# Patient Record
Sex: Female | Born: 1970
Health system: Southern US, Community
[De-identification: ages and names within clinical notes are randomized; demographics above are authoritative.]

## PROBLEM LIST (undated history)

## (undated) DIAGNOSIS — I10 Essential (primary) hypertension: Secondary | ICD-10-CM

## (undated) DIAGNOSIS — R112 Nausea with vomiting, unspecified: Secondary | ICD-10-CM

## (undated) DIAGNOSIS — N83202 Unspecified ovarian cyst, left side: Secondary | ICD-10-CM

## (undated) DIAGNOSIS — F419 Anxiety disorder, unspecified: Secondary | ICD-10-CM

## (undated) DIAGNOSIS — Z8759 Personal history of other complications of pregnancy, childbirth and the puerperium: Secondary | ICD-10-CM

## (undated) DIAGNOSIS — B029 Zoster without complications: Secondary | ICD-10-CM

## (undated) DIAGNOSIS — N920 Excessive and frequent menstruation with regular cycle: Secondary | ICD-10-CM

## (undated) DIAGNOSIS — F32A Depression, unspecified: Secondary | ICD-10-CM

## (undated) DIAGNOSIS — Z9889 Other specified postprocedural states: Secondary | ICD-10-CM

## (undated) DIAGNOSIS — Z8742 Personal history of other diseases of the female genital tract: Secondary | ICD-10-CM

## (undated) DIAGNOSIS — A498 Other bacterial infections of unspecified site: Secondary | ICD-10-CM

## (undated) DIAGNOSIS — T7840XA Allergy, unspecified, initial encounter: Secondary | ICD-10-CM

## (undated) DIAGNOSIS — D259 Leiomyoma of uterus, unspecified: Secondary | ICD-10-CM

## (undated) DIAGNOSIS — F329 Major depressive disorder, single episode, unspecified: Secondary | ICD-10-CM

## (undated) HISTORY — DX: Anxiety disorder, unspecified: F41.9

## (undated) HISTORY — DX: Zoster without complications: B02.9

## (undated) HISTORY — PX: APPENDECTOMY: SHX54

## (undated) HISTORY — DX: Major depressive disorder, single episode, unspecified: F32.9

## (undated) HISTORY — DX: Other bacterial infections of unspecified site: A49.8

## (undated) HISTORY — DX: Depression, unspecified: F32.A

## (undated) HISTORY — PX: TONSILLECTOMY AND ADENOIDECTOMY: SUR1326

## (undated) HISTORY — PX: DILATION AND EVACUATION: SHX1459

## (undated) HISTORY — PX: OTHER SURGICAL HISTORY: SHX169

## (undated) HISTORY — DX: Essential (primary) hypertension: I10

## (undated) HISTORY — DX: Allergy, unspecified, initial encounter: T78.40XA

---

## 1994-04-28 HISTORY — PX: WISDOM TOOTH EXTRACTION: SHX21

## 1999-07-23 ENCOUNTER — Other Ambulatory Visit: Admission: RE | Admit: 1999-07-23 | Discharge: 1999-07-23 | Payer: Self-pay | Admitting: Obstetrics and Gynecology

## 2000-08-12 ENCOUNTER — Other Ambulatory Visit: Admission: RE | Admit: 2000-08-12 | Discharge: 2000-08-12 | Payer: Self-pay | Admitting: Obstetrics and Gynecology

## 2001-05-03 ENCOUNTER — Encounter: Admission: RE | Admit: 2001-05-03 | Discharge: 2001-05-03 | Payer: Self-pay | Admitting: Family Medicine

## 2001-05-03 ENCOUNTER — Encounter: Payer: Self-pay | Admitting: Family Medicine

## 2001-08-14 ENCOUNTER — Inpatient Hospital Stay (HOSPITAL_COMMUNITY): Admission: AD | Admit: 2001-08-14 | Discharge: 2001-08-14 | Payer: Self-pay | Admitting: Obstetrics and Gynecology

## 2001-08-14 ENCOUNTER — Encounter: Payer: Self-pay | Admitting: Obstetrics and Gynecology

## 2001-08-18 ENCOUNTER — Encounter (INDEPENDENT_AMBULATORY_CARE_PROVIDER_SITE_OTHER): Payer: Self-pay

## 2001-08-18 ENCOUNTER — Observation Stay (HOSPITAL_COMMUNITY): Admission: AD | Admit: 2001-08-18 | Discharge: 2001-08-19 | Payer: Self-pay | Admitting: Obstetrics and Gynecology

## 2001-08-18 ENCOUNTER — Encounter: Payer: Self-pay | Admitting: Obstetrics and Gynecology

## 2001-10-04 ENCOUNTER — Other Ambulatory Visit: Admission: RE | Admit: 2001-10-04 | Discharge: 2001-10-04 | Payer: Self-pay | Admitting: Obstetrics & Gynecology

## 2001-10-25 ENCOUNTER — Encounter: Payer: Self-pay | Admitting: Obstetrics & Gynecology

## 2001-10-25 ENCOUNTER — Ambulatory Visit (HOSPITAL_COMMUNITY): Admission: RE | Admit: 2001-10-25 | Discharge: 2001-10-25 | Payer: Self-pay | Admitting: Obstetrics & Gynecology

## 2002-07-28 ENCOUNTER — Other Ambulatory Visit: Admission: RE | Admit: 2002-07-28 | Discharge: 2002-07-28 | Payer: Self-pay | Admitting: Obstetrics & Gynecology

## 2003-01-27 ENCOUNTER — Other Ambulatory Visit: Admission: RE | Admit: 2003-01-27 | Discharge: 2003-01-27 | Payer: Self-pay | Admitting: Obstetrics & Gynecology

## 2003-02-15 ENCOUNTER — Inpatient Hospital Stay (HOSPITAL_COMMUNITY): Admission: AD | Admit: 2003-02-15 | Discharge: 2003-02-17 | Payer: Self-pay | Admitting: Obstetrics & Gynecology

## 2003-02-15 ENCOUNTER — Encounter (INDEPENDENT_AMBULATORY_CARE_PROVIDER_SITE_OTHER): Payer: Self-pay | Admitting: Specialist

## 2003-02-18 ENCOUNTER — Encounter: Admission: RE | Admit: 2003-02-18 | Discharge: 2003-03-20 | Payer: Self-pay | Admitting: Obstetrics & Gynecology

## 2003-03-05 ENCOUNTER — Inpatient Hospital Stay (HOSPITAL_COMMUNITY): Admission: AD | Admit: 2003-03-05 | Discharge: 2003-03-05 | Payer: Self-pay | Admitting: Obstetrics & Gynecology

## 2003-03-10 ENCOUNTER — Encounter (INDEPENDENT_AMBULATORY_CARE_PROVIDER_SITE_OTHER): Payer: Self-pay | Admitting: *Deleted

## 2003-03-10 ENCOUNTER — Ambulatory Visit (HOSPITAL_COMMUNITY): Admission: RE | Admit: 2003-03-10 | Discharge: 2003-03-10 | Payer: Self-pay | Admitting: Obstetrics & Gynecology

## 2003-08-30 ENCOUNTER — Other Ambulatory Visit: Admission: RE | Admit: 2003-08-30 | Discharge: 2003-08-30 | Payer: Self-pay | Admitting: Obstetrics & Gynecology

## 2004-05-18 IMAGING — US US PELVIS COMPLETE MODIFY
1 series · 18 of 25 positions shown · non-contrast
Comparison: none

CLINICAL DATA: Vaginal delivery 02/15/03, heavy postpartum bleeding.
PELVIC AND TRANSVAGINAL ULTRASOUND - NON-OB
Transabdominal and endovaginal ultrasound of the pelvis performed.

[Series 1: us pelvis complete · 18 of 44 slices shown]
[im 1/44]
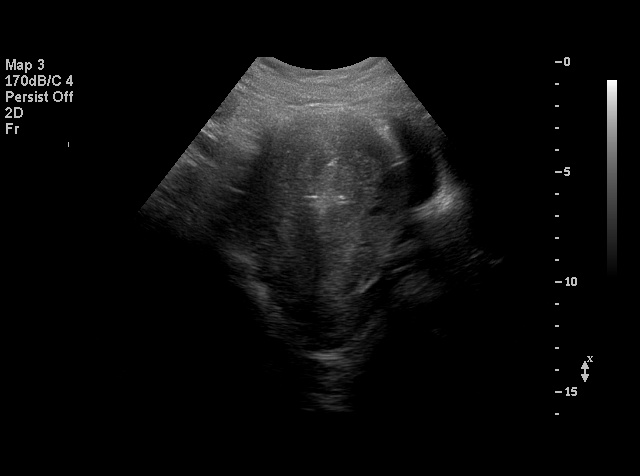
[im 4/44]
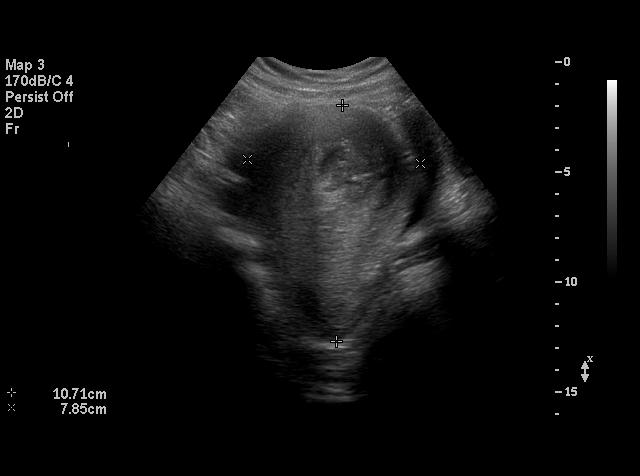
[im 6/44]
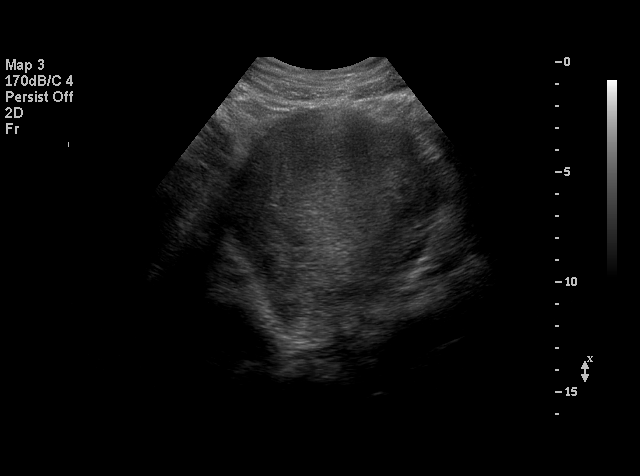
[im 8/44]
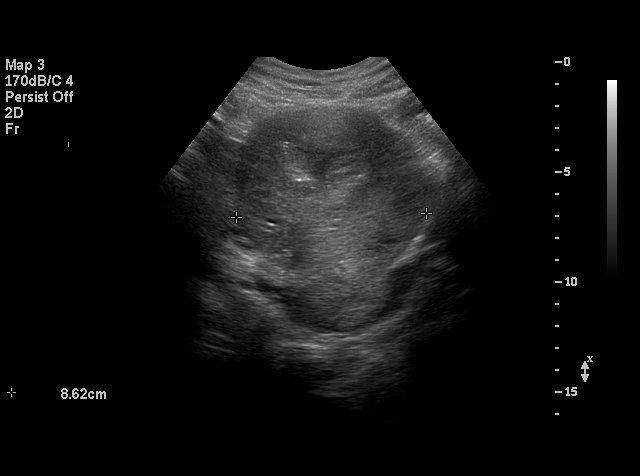
[im 11/44]
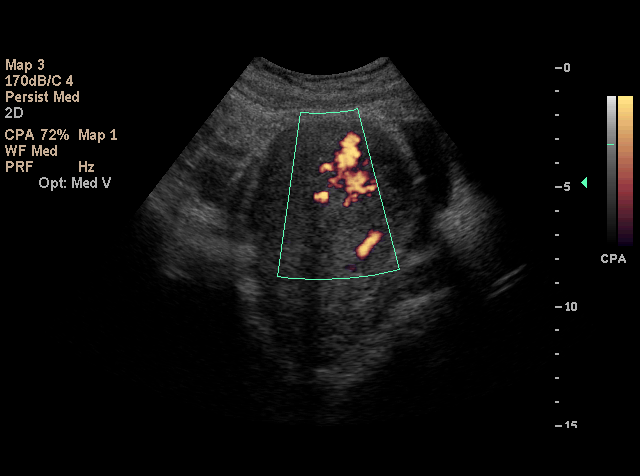
[im 13/44]
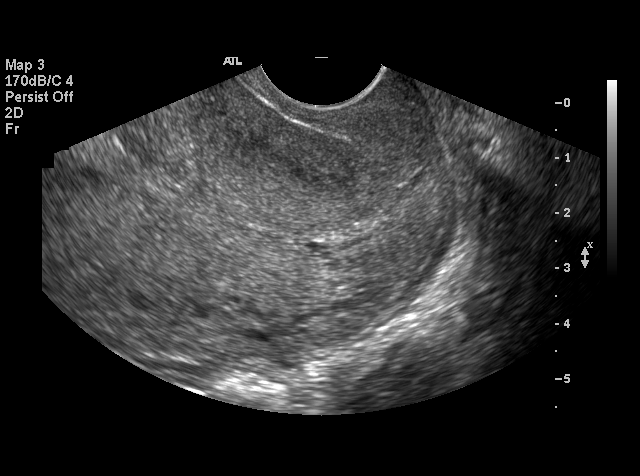
[im 17/44]
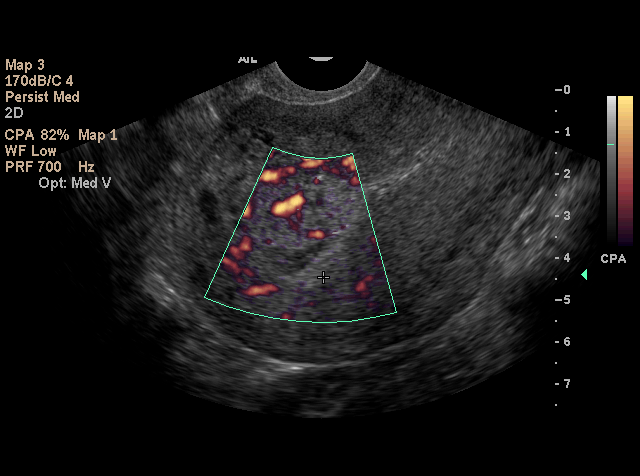
[im 18/44]
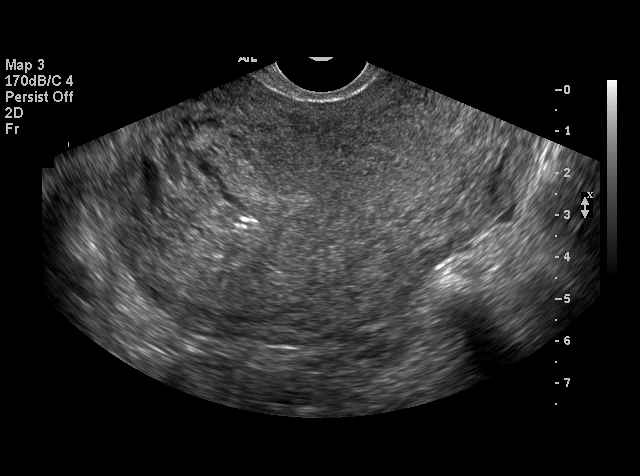
[im 20/44]
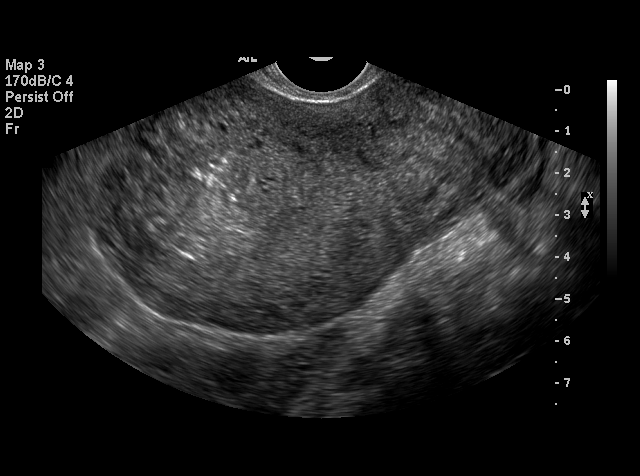
[im 24/44]
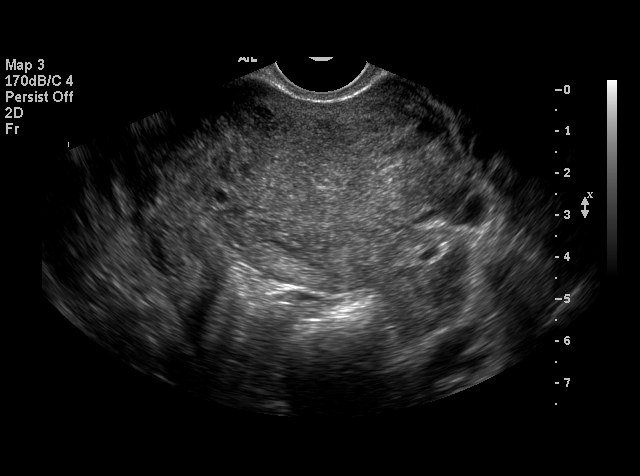
[im 26/44]
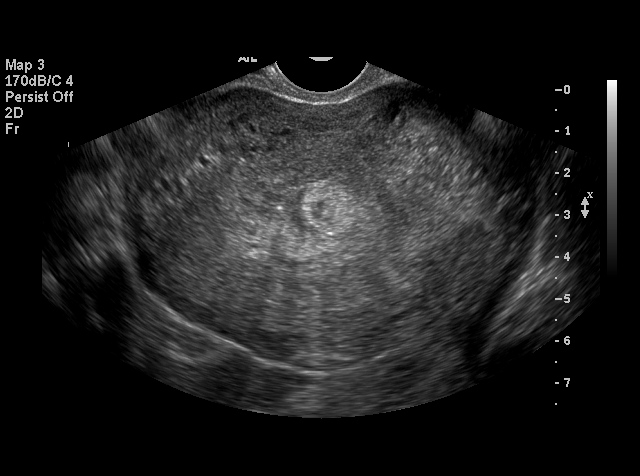
[im 27/44]
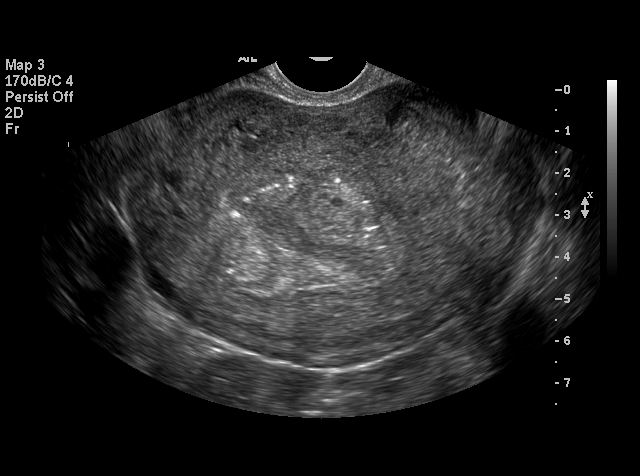
[im 31/44]
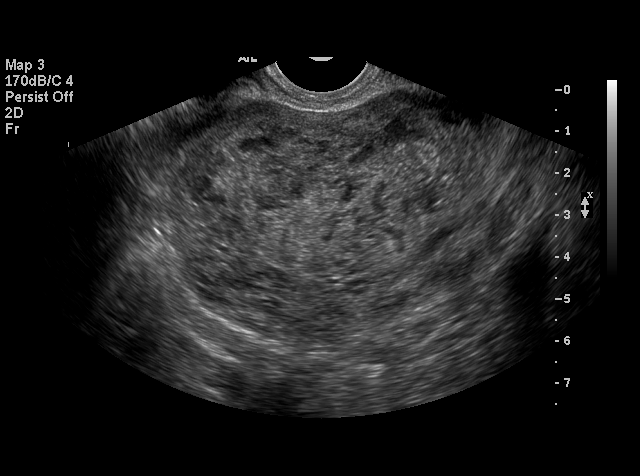
[im 33/44]
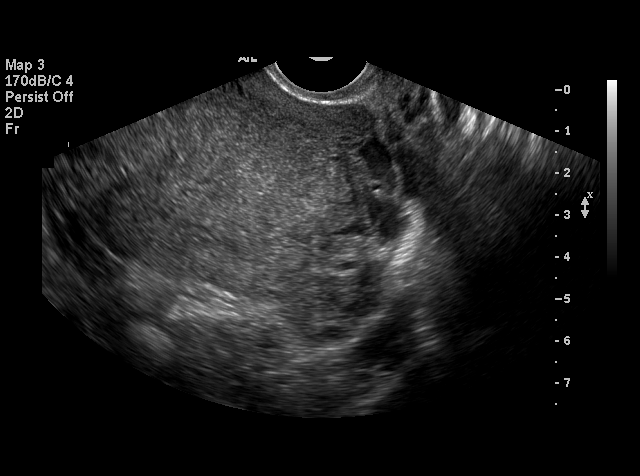
[im 36/44]
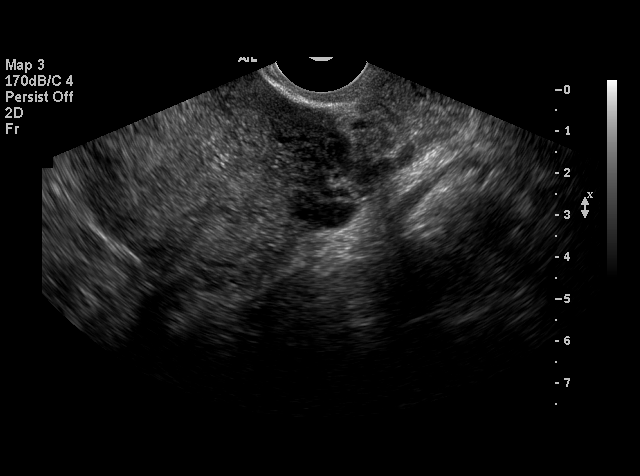
[im 38/44]
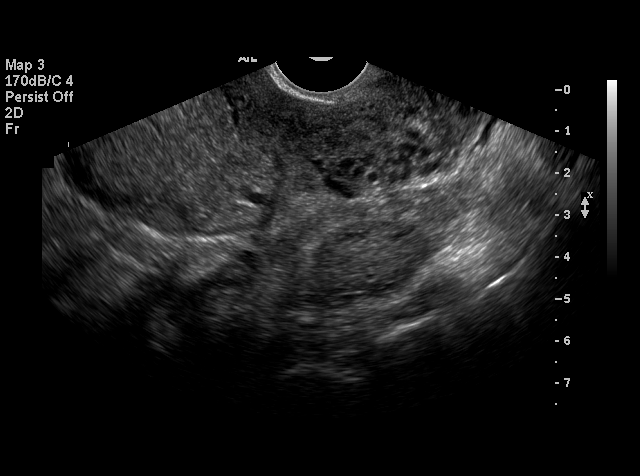
[im 40/44]
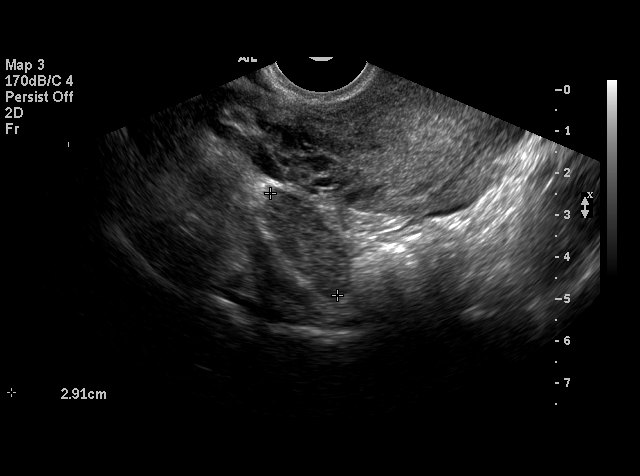
[im 44/44]
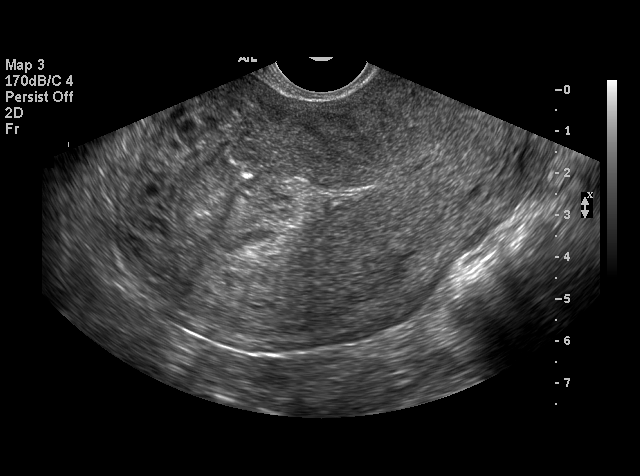

[18 of 25 positions shown; findings below may reference images not displayed]

The uterus measures 10.7 cm length x 7.6 cm AP x 8.6 cm transverse.  At the fundus of the uterus, the endometrial stripe is thickened, inhomogeneous, with somewhat echogenic areas.  Increased blood flow is seen within this site on color doppler imaging.  Findings are highly suspicious for retained products of conception.  No free pelvic fluid.  Ovaries unremarkable, right measuring 3.5 x 1.8 x 2.9 cm and left measuring 3.4 x 1.8 x 3.0 cm. 
IMPRESSION
Normal ovaries.  Hypervascular inhomogeneous thickening of the fundal portion of the endometrial stripe highly suggestive of retain products of conception.

## 2007-11-30 ENCOUNTER — Encounter: Admission: RE | Admit: 2007-11-30 | Discharge: 2007-11-30 | Payer: Self-pay | Admitting: Family Medicine

## 2007-11-30 ENCOUNTER — Encounter (INDEPENDENT_AMBULATORY_CARE_PROVIDER_SITE_OTHER): Payer: Self-pay | Admitting: General Surgery

## 2007-11-30 ENCOUNTER — Inpatient Hospital Stay (HOSPITAL_COMMUNITY): Admission: EM | Admit: 2007-11-30 | Discharge: 2007-12-01 | Payer: Self-pay | Admitting: Emergency Medicine

## 2007-11-30 HISTORY — PX: LAPAROSCOPIC APPENDECTOMY: SUR753

## 2009-04-28 HISTORY — PX: SEPTOPLASTY: SUR1290

## 2010-05-18 ENCOUNTER — Encounter: Payer: Self-pay | Admitting: Family Medicine

## 2010-08-07 ENCOUNTER — Other Ambulatory Visit: Payer: Self-pay | Admitting: Obstetrics & Gynecology

## 2010-09-10 NOTE — H&P (Signed)
NAMEAMBERMARIE, HONEYMAN                 ACCOUNT NO.:  [id number]   MEDICAL RECORD NO.:  [id number]          PATIENT TYPE:  INP   LOCATION:  5125                         FACILITY:  [facility]   PHYSICIAN:  Wilmon Arms. Corliss Skains, M.D. DATE OF BIRTH:  25-Aug-1970   DATE OF ADMISSION:  11/30/2007  DATE OF DISCHARGE:                              HISTORY & PHYSICAL   ADMITTING PHYSICIAN:  Wilmon Arms. Corliss Skains, MD.   PRIMARY CARE PHYSICIAN:  [employer]   CHIEF COMPLAINT:  Right lower quadrant abdominal pain.   HISTORY OF PRESENT ILLNESS:  Ms. Shareef is a 40 year old, otherwise,  healthy female patient who reports 3 days of right lower quadrant  abdominal pain waxing and waning becoming more constant associated with  nausea and emesis.  She presents to her primary care physician's office  today where she was found to have leukocytosis.  This is per the patient  report.  No labs accompany the patient here to ER.  She subsequently  underwent a CT of the abdomen and pelvis, which revealed appendicitis.  Urine pregnancy was self-reported as being negative prior to the CT scan  per the patient.  Labs including CBC and repeat urine pregnancy are  pending here.  In regards to the patient's symptoms, her pain has  decreased from constant to mainly with movement and sharp in nature.  She has not had any further nausea since going to the doctor yesterday  and receiving a Phenergan dose.  She has not eaten since yesterday.   PAST MEDICAL HISTORY:  Seasonal allergies.   PAST SURGICAL HISTORY:  Ectopic pregnancy requiring surgical removal via  small left lower quadrant incision several years ago.   FAMILY HISTORY:  Noncontributory.   SOCIAL HISTORY:  No tobacco.  Rare social alcohol.  She is married.  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   ALLERGIES:  NKDA.   CURRENT MEDICATIONS:  1. Claritin p.r.n.  2. Microgestin 1.5/30 daily.   REVIEW OF SYSTEMS:  As per the history of  present illness.  Otherwise,  all categories of review of systems are noncontributory and negative.   PHYSICAL EXAMINATION:  GENERAL:  Pleasant female patient complaining of  improvement in right lower quadrant pain, but still present and severe  when occurs.  VITAL SIGNS:  Temperature 97.3, BP 134/84, pulse 86 and regular,  respirations 18.  HEENT:  Head is normocephalic.  NECK:  Supple.  No appreciable adenopathy.  LUNGS:  Bilateral lung sounds are clear to auscultation.  Respiratory  effort is unlabored.  CARDIOVASCULAR:  Heart sounds, S1 and S2.  No rubs, murmurs, thrills, or  gallops.  No JVD.  No peripheral edema.  GASTROINTESTINAL:  Abdomen is soft and nondistended.  Bowel sounds are  present and hyperactive.  She is tender in the right lower quadrant over  McBurney point.  No guarding.  No rebounding.  EXTREMITIES:  Symmetrical in appearance without edema or clubbing.  Moving all extremities x4.  Strength is 5/5 symmetrically and sensation  is intact in the upper and lower extremities bilaterally.  NEUROLOGIC:  Cranial nerves II-XII are grossly intact.  PSYCH:  The patient is alert and oriented x3.  Moving all extremities  x4.  Affect is appropriate to current situation.   LABS AND X-RAY:  CMP, CBC, and urine pregnancy are all pending.  CT of  the abdomen, a typewritten report from [facility] accompanies  the patient and again shows an enlarged appendix consistent with acute  appendicitis.  No abscess.  No evidence of perforation.   IMPRESSION:  Acute appendicitis.   PLAN:  1. Admit the patient.  Plan operative intervention tonight.  2. Empiric Unasyn IV, on-call to OR once pregnancy test has returned      back negative.  3. Symptomatic treatment with pain medication and antiemetics.  The      patient reports that she has severe nausea with anesthetic agent.      She has already received a dose of Zofran.  We will go ahead and      give her Protonix IV,  scopolamine now to help ward off these      symptoms postoperatively.      Allison L. Rondel Jumbo. Tsuei, M.D.  Electronically Signed    ALE/MEDQ  D:  11/30/2007  T:  12/01/2007  Job:  [id number]   cc:   [facility]

## 2010-09-10 NOTE — Op Note (Signed)
[name], [name]                 ACCOUNT NO.:  [id number]   MEDICAL RECORD NO.:  [id number]          PATIENT TYPE:  INP   LOCATION:  5125                         FACILITY:  [facility]   PHYSICIAN:  Sharlet Salina T. Hoxworth, M.D.DATE OF BIRTH:  December 22, 1970   DATE OF PROCEDURE:  DATE OF DISCHARGE:                               OPERATIVE REPORT   PREOPERATIVE DIAGNOSIS:  Acute appendicitis.   POSTOPERATIVE DIAGNOSIS:  Acute appendicitis.   SURGICAL PROCEDURES:  Laparoscopic appendectomy.   SURGEON:  Lorne Skeens. Hoxworth, MD   ANESTHESIA:  General.   BRIEF HISTORY:  [name] is a generally healthy 40 year old female  who presents with typical symptoms and physical findings for acute  appendicitis.  This has been confirmed by CT scan.  We have recommended  proceeding with laparoscopic appendectomy.  I discussed the nature of  procedure, indications, risks of bleeding, infection, and possible need  for open procedure.  She is now brought to operating room for this  procedure.   DESCRIPTION OF OPERATION:  The patient was brought to the operating  room, placed in supine position on the operating table, and general  orotracheal anesthesia was induced.  Foley catheter was placed.  PAS  were placed.  She was on broad-spectrum antibiotics.  The abdomen was  widely and sterilely prepped and draped.  Correct patient and procedure  were verified.  Local anesthesia was used to infiltrate the trocar  sites.  Access was obtained through a 1-cm infraumbilical incision using  the Hasson trocar through a mattress suture of 0 Vicryl and  pneumoperitoneum established.  Under direct vision, a 5-mm trocar was  placed in the right upper quadrant and 12-mm trocar in the left lower  quadrant.  The appendix was acutely inflamed, very edematous, but no  evidence of perforation or gangrene.  The appendix was mobilized.  There  were fairly extensive lateral peritoneal attachments that were divided  with the  Harmonic scalpel, mobilizing the appendix down to its base.  The base was not inflamed.  The mesoappendix was sequentially divided  with the Harmonic scalpel.  Finally, the appendix was completely freed  all the way to the tip of the cecum.  The appendix was divided at the  tip of the cecum with a single firing of blue load Endo-GIA stapler.  The staple line was intact without bleeding.  The appendix was placed in  an EndoCatch bag and removed from the abdomen.  The abdomen was then  thoroughly irrigated with saline and complete hemostasis assured.  There  was no evidence of trocar injury and no evidence of bleeding.  Trocar  was removed, and all CO2 evacuated.  The mattress suture was secured to  the umbilicus.  Skin incisions were closed with subcuticular Monocryl  and Dermabond.  She was taken to recovery room in good condition.      Lorne Skeens. Hoxworth, M.D.  Electronically Signed    BTH/MEDQ  D:  11/30/2007  T:  12/01/2007  Job:  [id number]

## 2010-09-13 NOTE — H&P (Signed)
NAME:  LAURENA, VALKO                           ACCOUNT NO.:  [id number]   MEDICAL RECORD NO.:  [id number]                   PATIENT TYPE:  INP   LOCATION:  9164                                 FACILITY:  [facility]   PHYSICIAN:  Richardean Sale, M.D.                DATE OF BIRTH:  04-Oct-1970   DATE OF ADMISSION:  02/15/2003  DATE OF DISCHARGE:                                HISTORY & PHYSICAL   ADMISSION DIAGNOSIS:  [age]-year-old gravida 2, para 0, 0, 1, 0 white  female at [redacted] weeks gestation with spontaneous rupture of membranes.   CHIEF COMPLAINT:  Leaking of fluid.   HISTORY OF PRESENT ILLNESS:  This is a gravida 2, para 0, 0, 1, 0 at 40-  0/7ths weeks gestation with an estimated due date of March 08, 2003 who  presented to the office today complaining of leaking of fluid.  The patient  denies any contractions and reports good fetal movement.  She denies any  vaginal bleeding.  The pregnancy has been uneventful per the patient.  She  did have an abnormal Pap smear and subsequently underwent colposcopy in June  2004 with a repeat Pap smear performed, but results not available as of yet.   PAST OBSTETRICAL HISTORY:  Ectopic pregnancy.   PAST GYNECOLOGICAL HISTORY:  Recent abnormal Pap smear.   PAST MEDICAL HISTORY:  None.   PAST SURGICAL HISTORY:  None.   FAMILY HISTORY:  No known genetic diseases or inherited abnormalities.  No  known coagulopathy.   SOCIAL HISTORY:  No tobacco or alcohol, or drugs.   MEDICATIONS:  Prenatal vitamins.   ALLERGIES:  No known drug allergies.   PRENATAL LABORATORY DATA:  Group B beta Strep negative.  One-hour Glucola  96,  twenty-eight-week hemoglobin 12.6 and platelet count 33,000.  Blood  type A positive, antibody screen negative.  RPR nonreactive.  Rubella  immune.  Hepatitis B surface antigen negative.  HIV nonreactive.  GC and  Chlamydia negative.   PHYSICAL EXAMINATION:  GENERAL:  The patient is a well-developed, well-  nourished  white female in no apparent distress.  ABDOMEN:  Abdomen is soft, nontender and nondistended.  Appears consistent  with and estimated gestation age of 41 weeks.  Heart tones are heard in the  160s.  PELVIC EXAMINATION:  Vaginal exam consistent with gross rupture of membranes  and positive nitrazine.  The cervix is 4, 80, -2 and vertex.   ASSESSMENT:  A 40 year old gravida 2, para 1, 0, 0, 1, 0 at [redacted] weeks  gestation with spontaneous rupture or membranes.   PLAN:  1. We will send the patient to labor & delivery for admission.  2. Pitocin augmentation if no spontaneous onset of contraction.  3. Expect spontaneous vaginal delivery.  4. Continuous monitoring.  Richardean Sale, M.D.    JW/MEDQ  D:  02/15/2003  T:  02/15/2003  Job:  [id number]

## 2010-09-13 NOTE — Op Note (Signed)
[facility]  Patient:    Dawn Scott, Dawn Scott Visit Number: [id number] MRN: [id number]          Service Type: OBS Location: 9300 [id number] Attending Physician:  Lenoard Aden Dictated by:   Lenoard Aden, M.D. Proc. Date: 08/18/01 Admit Date:  08/18/2001                             Operative Report  CHIEF COMPLAINT:              Left lower quadrant pain, presumed ruptured left ectopic pregnancy.  POSTOPERATIVE DIAGNOSES:      Left lower quadrant pain, presumed ruptured left ectopic pregnancy.  PROCEDURE:                    Diagnostic laparoscopy, lysis of adhesions, fulguration of questionable endometriosis versus decidual tissue, left salpingostomy with removal of ectopic.  SURGEON:                      Lenoard Aden, M.D.  ASSISTANT:                    Genia Del, M.D.  ANESTHESIA:                   General by Oddono.  ESTIMATED BLOOD LOSS:         (Including blood in the belly upon entry) Approximately 100 cc.  COMPLICATIONS:                None.  DRAINS:                       None.  SPECIMEN:                     Products of conception and adherent clot to pathology.  DISPOSITION:                  Patient to recovery in good condition.  BRIEF OPERATIVE NOTE:         After being apprised of the risks of anesthesia, infection, bleeding, injury to abdominal organs, need for repair, delayed versus immediate complications to include bowel and bladder injury, possible need for removal of left tube versus conservative surgery, patient is brought to the operating room where she is administered general anesthesia without complications, prepped and draped in usual sterile fashion, Foley catheter placed.  Examination under anesthesia reveals a sharply retroflexed uterus. Hulka tenaculum single tooth tenaculum placed per vagina.  Infraumbilical incision made with a scalpel.  After placement of dilute Marcaine solution, patient  pressure set to 25.  Atraumatic Veress needle entry is placed. Opening pressure of -1.  Hanging drop test is consistent with intraperitoneal entry.  A 10 mm trocar placed.  At this time visualization reveals atraumatic entry.  Hemoperitoneum is noted with blood around the liver and gallbladder. Appendix not visualized.  Liver and gallbladder appear within normal limits. There is adherence of the left sigmoid colon down the left pelvic side wall and into the left adnexa.  After placement of a 5 mm trocar site with the use of a scalpel in the left lower quadrant after transillumination and on the right side a 10 mm site is made after transillumination in the right lower quadrant.  Trocars are placed under direct visualization and atraumatically. The adhesions are then bluntly lysed  using gentle torsion against the left pelvic side wall to expose the left adnexa.  Upon elevating the left tube there is a large ectopic which is in the process of tubally aborting.  No evidence of tubal rupture.  There is obvious dilatation of the tube throughout the ampullary isthmic portions of the tube, but not into the cornua.  Left ovary appears normal.  Right tube and ovary appear normal.  Placement of dilute Pitressin solution 20 and 50 through a 22-gauge spinal needle is placed transabdominally with direct injection into the caudal portion over the area of presumed ectopic.  This area is then cauterized in a linear salpingostomy form using a Martin needle and irrigation is accomplished.  Blood clots had previously been removed with a Nageotte irrigator.  At this time the tubal pregnancy and products of conception are visualized and extracted from the tube using atraumatic grasper.  These are easily teased out of the tube, placed into an endo catch sac, and sent to pathology.  Removed through a 10 mm port.  Irrigation is then accomplished.  No bleeding from the fimbria or linear salpingostomy site is noted.   The tube appears to clear within normal limits.  Inspection of the internal portion of the tube appears within normal limits.  No evidence of adherent ectopic.  In the cul-de-sac there is evidence of grape like vesicles consistent with either atrophoblastic tissue versus decidual reaction versus adhesions from a possible endometriosis.  Cul-de-sac is somewhat obliterated.  This area is then gently lysed into the mid portion of the cul-de-sac using blunt dissection.  The uterosacral ligaments are then easily visualized.  These grape like vesicles are cauterized using bipolar cautery with Kleppinger after visualizing the course of the ureter bilaterally.  These appear in the midline.  Irrigation is accomplished.  Good hemostasis is achieved.  Visualization then reveals a nonbleeding left tube and a nonbleeding right tube, two normal ovaries, no evidence of further adhesive disease.  Instruments are removed under direct visualization.  CO2 is released.  Trocar is removed under direct visualization 10 mm port.  Incisions closed using a 0 Vicryl in the umbilical port and Dermabond in the lower two ports.  Dermabond is placed over the umbilical port skin area and instruments are removed from the vagina.  Patient is awakened and transferred to recovery in good condition. Dictated by:   Lenoard Aden, M.D. Attending Physician:  Lenoard Aden DD:  08/18/01 TD:  08/19/01 Job: [id number]

## 2010-09-13 NOTE — Op Note (Signed)
   NAME:  Dawn Scott, Dawn Scott                           ACCOUNT NO.:  [id number]   MEDICAL RECORD NO.:  [id number]                   PATIENT TYPE:  AMB   LOCATION:  SDC                                  FACILITY:  [facility]   PHYSICIAN:  Genia Del, M.D.             DATE OF BIRTH:  01-15-1971   DATE OF PROCEDURE:  03/10/2003  DATE OF DISCHARGE:                                 OPERATIVE REPORT   PREOPERATIVE DIAGNOSIS:  Retained placental tissue postpartum.   POSTOPERATIVE DIAGNOSIS:  Retained placental tissue postpartum.   INTERVENTION:  Dilatation and evacuation.   SURGEON:  Genia Del, M.D.   ANESTHESIOLOGIST:  Octaviano Glow. Pamalee Leyden, M.D.   DESCRIPTION OF PROCEDURE:  Under MAC analgesia plus paracervical block, the  patient is in lithotomy position.  She is prepped with Hibiclens on the  suprapubic, vulvar, and vaginal areas and draped as usual.  The vaginal exam  reveals an anteverted uterus about 10 cm in diameter, mobile, no adnexal  mass.  The speculum is introduced into the vagina.  The paracervical block  is done with lidocaine 1% at 4 o'clock and 8 o'clock, a total of 20 mL.  Dilatation is done with Hegar dilators up to a #31 without any difficulty.  We then use a #10 curved suction curette and placental tissue is evacuated  corresponding to a 2 x 3 cm piece.  We then use a sharp curette to proceed  with a systematic curettage of the uterine cavity.  The uterine sound is  heard all over. The cavity is regular.  We then finish with the suction  curette to remove any blood clots.  To help the uterus contract better,  Methergine is given IM 0.25 mg.  The instruments are removed.  Hemostasis is  adequate.  No complication occurred.  She received Ancef 1 g IV at the  beginning of the surgery.  Her blood group is A positive.  The patient was  brought to the recovery room in good status.                                               Genia Del, M.D.    ML/MEDQ  D:   03/10/2003  T:  03/10/2003  Job:  [id number]

## 2010-09-13 NOTE — H&P (Signed)
[facility]  Patient:    Dawn Scott, Dawn Scott Visit Number: [id number] MRN: [id number]          Service Type: OBS Location: [phone] Attending Physician:  Lenoard Aden Dictated by:   Lenoard Aden, M.D. Admit Date:  08/18/2001                           History and Physical  CHIEF COMPLAINT:              Left ectopic.  HISTORY OF PRESENT ILLNESS:   The patient is a 40 year old white female, G1, P0, 7 weeks LMP with bleeding, left-sided pain, inappropriately rising hCG titers consistent with tubal pregnancy noted with hCGs going from 1800 to 2800 to 2500 over a six day period.  Ultrasound performed at [facility] reveals echogenicity and questionable blood in the cul-de-sac with a left adnexal mass, normal left ovary, normal right ovary, and empty uterus.  PAST MEDICAL HISTORY:         1. Tonsillectomy with some tooth removal.                               2. Large mole removal from the abdomen.  FAMILY HISTORY:               Noncontributory.  LABORATORY EVALUATION:        Quantitative hCG of 2546.  CBC:  Hemoglobin 12.6, hematocrit 37.6, platelet count 241,000.  Blood type A positive.  PHYSICAL EXAMINATION:  GENERAL:                      She is an uncomfortable-appearing white female in a moderate amount of distress.  HEENT:                        Normal.  LUNGS:                        Clear.  HEART:                        Regular rate and rhythm.  ABDOMEN:                      Tender to palpation with rebound and direct guarding to tenderness in the left lower quadrant.  PELVIC:                       Retroflexed uterus and bilateral pelvic tenderness.  VITAL SIGNS:                  Blood pressure 115/53, pulse 62, temperature 97.8.  IMPRESSION:                   Left ectopic pregnancy with probable rupture of the left ectopic pregnancy.  PLAN:                         1. Proceed with diagnostic laparoscopy                  2. Possible laparotomy.  3. Possible salpingectomy versus                                  salpingotomy.  Risks of anesthesia, infection, bleeding, injury to abdominal organs with need for repair are discussed.  Delayed versus immediate complications to include bowel and bladder injury, inability to perform conservative surgery on the tube versus tubal removal discussed.  The patient acknowledges and desires to proceed. Dictated by:   Lenoard Aden, M.D. Attending Physician:  Lenoard Aden DD:  08/18/01 TD:  08/18/01 Job: [id number]

## 2011-01-24 LAB — URINALYSIS, ROUTINE W REFLEX MICROSCOPIC
Glucose, UA: NEGATIVE
Protein, ur: NEGATIVE
Specific Gravity, Urine: 1.039 — ABNORMAL HIGH

## 2011-01-24 LAB — COMPREHENSIVE METABOLIC PANEL
ALT: <8
Albumin: 3.6
Alkaline Phosphatase: 44
GFR calc Af Amer: >60
Potassium: 3.7
Sodium: 137
Total Protein: 6.6

## 2011-01-24 LAB — URINE MICROSCOPIC-ADD ON

## 2011-01-24 LAB — DIFFERENTIAL
Basophils Relative: 0
Eosinophils Absolute: 0
Monocytes Absolute: 0.3
Monocytes Relative: 5
Neutro Abs: 5.3

## 2011-01-24 LAB — PREGNANCY, URINE: Preg Test, Ur: NEGATIVE

## 2011-01-24 LAB — CBC
Platelets: 240
RDW: 13.2

## 2011-10-17 ENCOUNTER — Encounter: Payer: Self-pay | Admitting: Family

## 2011-10-17 ENCOUNTER — Ambulatory Visit (INDEPENDENT_AMBULATORY_CARE_PROVIDER_SITE_OTHER): Payer: BC Managed Care – PPO | Admitting: Family

## 2011-10-17 VITALS — BP 124/88 | Ht 69.5 in | Wt 180.0 lb

## 2011-10-17 DIAGNOSIS — F4321 Adjustment disorder with depressed mood: Secondary | ICD-10-CM

## 2011-10-17 DIAGNOSIS — F411 Generalized anxiety disorder: Secondary | ICD-10-CM

## 2011-10-17 DIAGNOSIS — F329 Major depressive disorder, single episode, unspecified: Secondary | ICD-10-CM

## 2011-10-17 DIAGNOSIS — F32A Depression, unspecified: Secondary | ICD-10-CM

## 2011-10-17 DIAGNOSIS — F419 Anxiety disorder, unspecified: Secondary | ICD-10-CM

## 2011-10-17 DIAGNOSIS — F3289 Other specified depressive episodes: Secondary | ICD-10-CM

## 2011-10-17 MED ORDER — FLUOXETINE HCL 10 MG PO CAPS: 10.0000 mg | ORAL_CAPSULE | Freq: Every day | ORAL | Status: DC

## 2011-10-17 NOTE — Progress Notes (Signed)
  Subjective:    Patient ID: Dawn Scott, female    DOB: [birth year], 41 y.o.   MRN: [id number]  Anxiety Presents for initial visit. Onset was 1 to 6 months ago. The problem has been gradually improving. Symptoms include depressed mood, irritability and nervous/anxious behavior. Patient reports no chest pain, confusion or suicidal ideas. Episode frequency: about twice a week. The severity of symptoms is moderate. The symptoms are aggravated by family issues (Patient's husband died unexpectantly at work after a fall. Under investigation by [employer].). The quality of sleep is fair. Nighttime awakenings: several.   Risk factors: Death of husband. Past treatments include benzodiazephines (Has been on SSRIs in the past.).      Review of Systems  Constitutional: Positive for irritability.  Respiratory: Negative.   Cardiovascular: Negative.  Negative for chest pain.  Gastrointestinal: Negative.   Genitourinary: Negative.   Musculoskeletal: Negative.   Neurological: Negative.   Psychiatric/Behavioral: Positive for disturbed wake/sleep cycle. Negative for suicidal ideas, confusion and self-injury. The patient is nervous/anxious.    No past medical history on file.  History   Social History  . Marital Status: Married    Spouse Name: N/A    Number of Children: N/A  . Years of Education: N/A   Occupational History  . Not on file.   Social History Main Topics  . Smoking status: Never Smoker   . Smokeless tobacco: Not on file  . Alcohol Use: Yes  . Drug Use: No  . Sexually Active:    Other Topics Concern  . Not on file   Social History Narrative  . No narrative on file    No past surgical history on file.  Family History  Problem Relation Age of Onset  . Depression Mother   . Alcohol abuse Mother   . Alcohol abuse Father   . Alcohol abuse Maternal Grandfather     No Known Allergies  Current Outpatient Prescriptions on File Prior to Visit  Medication Sig Dispense Refill  .  zolpidem (AMBIEN) 10 MG tablet Take 10 mg by mouth at bedtime as needed.      Marland Kitchen FLUoxetine (PROZAC) 10 MG capsule Take 1 capsule (10 mg total) by mouth daily.  30 capsule  3    BP 124/88  Ht 5' 9.5" (1.765 m)  Wt 180 lb (81.647 kg)  BMI 26.20 kg/m2chart    Objective:   Physical Exam  Constitutional: She is oriented to person, place, and time. She appears well-developed and well-nourished.  Neck: Normal range of motion. Neck supple.  Cardiovascular: Normal rate, regular rhythm and normal heart sounds.   Pulmonary/Chest: Effort normal and breath sounds normal.  Neurological: She is alert and oriented to person, place, and time.  Skin: Skin is warm and dry.  Psychiatric: She has a normal mood and affect.          Assessment & Plan:  Assessment: Anxiety, Depression, Grief Reaction  Plan: Continue seeing therapist. Out of work x 4-6 weeks to allow medication to stabilize patient's moods. Start Prozac 10mg  once daily. OV in 4 weeks. CPX at that time.

## 2011-10-17 NOTE — Patient Instructions (Signed)
Grief Reaction Grief is a normal response to the death of someone close to you. Feelings of fear, anger, and guilt can affect almost everyone who loses someone they love. Symptoms of depression are also common. These include problems with sleep, loss of appetite, and lack of energy. These grief reaction symptoms often last for weeks to months after a loss. They may also return during special times that remind you of the person you lost, such as an anniversary or birthday. Anxiety, insomnia, irritability, and deep depression may last beyond the period of normal grief. If you experience these feelings for 6 months or longer, you may have clinical depression. Clinical depression requires further medical attention. If you think that you have clinical depression, you should contact your caregiver. If you have a history of depression and or a family history of depression, you are at greater risk of clinical depression. You are also at greater risk of developing clinical depression if the loss was traumatic or the loss was of someone with whom you had unresolved issues.  A grief reaction can become complicated by being blocked. This means being unable to cry or express extreme emotions. This may prolong the grieving period and worsen the emotional effects of the loss. Mourning is a natural event in human life. A healthy grief reaction is one that is not blocked . It requires a time of sadness and readjustment.It is very important to share your sorrow and fear with others, especially close friends and family. Professional counselors and clergy can also help you process your grief. Document Released: 04/14/2005 Document Revised: 04/03/2011 Document Reviewed: 12/23/2005 [facility] Patient Information 2012 [place], [place].  Anxiety and Panic Attacks Your caregiver has informed you that you are having an anxiety or panic attack. There may be many forms of this. Most of the time these attacks come suddenly and without  warning. They come at any time of day, including periods of sleep, and at any time of life. They may be strong and unexplained. Although panic attacks are very scary, they are physically harmless. Sometimes the cause of your anxiety is not known. Anxiety is a protective mechanism of the body in its fight or flight mechanism. Most of these perceived danger situations are actually nonphysical situations (such as anxiety over losing a job). CAUSES  The causes of an anxiety or panic attack are many. Panic attacks may occur in otherwise healthy people given a certain set of circumstances. There may be a genetic cause for panic attacks. Some medications may also have anxiety as a side effect. SYMPTOMS  Some of the most common feelings are:  Intense terror.   Dizziness, feeling faint.   Hot and cold flashes.   Fear of going crazy.   Feelings that nothing is real.   Sweating.   Shaking.   Chest pain or a fast heartbeat (palpitations).   Smothering, choking sensations.   Feelings of impending doom and that death is near.   Tingling of extremities, this may be from over-breathing.   Altered reality (derealization).   Being detached from yourself (depersonalization).  Several symptoms can be present to make up anxiety or panic attacks. DIAGNOSIS  The evaluation by your caregiver will depend on the type of symptoms you are experiencing. The diagnosis of anxiety or panic attack is made when no physical illness can be determined to be a cause of the symptoms. TREATMENT  Treatment to prevent anxiety and panic attacks may include:  Avoidance of circumstances that cause anxiety.  Reassurance and relaxation.   Regular exercise.   Relaxation therapies, such as yoga.   Psychotherapy with a psychiatrist or therapist.   Avoidance of caffeine, alcohol and illegal drugs.   Prescribed medication.  SEEK IMMEDIATE MEDICAL CARE IF:   You experience panic attack symptoms that are different  than your usual symptoms.   You have any worsening or concerning symptoms.  Document Released: 04/14/2005 Document Revised: 04/03/2011 Document Reviewed: 08/16/2009 [facility] Patient Information 2012 [place], [place].

## 2011-11-12 ENCOUNTER — Encounter: Payer: Self-pay | Admitting: Family

## 2011-11-12 ENCOUNTER — Ambulatory Visit (INDEPENDENT_AMBULATORY_CARE_PROVIDER_SITE_OTHER): Payer: BC Managed Care – PPO | Admitting: Family

## 2011-11-12 VITALS — BP 118/76 | Temp 99.0°F | Wt 180.0 lb

## 2011-11-12 DIAGNOSIS — F411 Generalized anxiety disorder: Secondary | ICD-10-CM

## 2011-11-12 DIAGNOSIS — F419 Anxiety disorder, unspecified: Secondary | ICD-10-CM

## 2011-11-12 DIAGNOSIS — Z0000 Encounter for general adult medical examination without abnormal findings: Secondary | ICD-10-CM

## 2011-11-12 LAB — BASIC METABOLIC PANEL
BUN: 11 mg/dL (ref 6–23)
Chloride: 105 mEq/L (ref 96–112)
Creatinine, Ser: 0.7 mg/dL (ref 0.4–1.2)
Glucose, Bld: 90 mg/dL (ref 70–99)

## 2011-11-12 LAB — LIPID PANEL
Cholesterol: 206 mg/dL — ABNORMAL HIGH (ref 0–200)
Total CHOL/HDL Ratio: 3

## 2011-11-12 LAB — LDL CHOLESTEROL, DIRECT: Direct LDL: 126.9 mg/dL

## 2011-11-12 NOTE — Progress Notes (Signed)
Subjective:    Patient ID: Dawn Scott, female    DOB: October 11, 1970, 41 y.o.   MRN: [id number]  HPI 41 year old white female, nonsmoker is in for complete physical exam. She's currently on Prozac 20 mg once daily and tolerating the medication very well. She takes Xanax when necessary one half tablet. She is continuing to have some difficulty with sleep at night. She sees gynecology for Pap smears and pelvic exams. Last mammogram was within normal limits.  This is a routine physical examination for this healthy  Female. Reviewed all health maintenance protocols including mammography colonoscopy bone density and reviewed appropriate screening labs. Her immunization history was reviewed as well as her current medications and allergies refills of her chronic medications were given and the plan for yearly health maintenance was discussed all orders and referrals were made as appropriate.   Review of Systems  HENT: Negative.   Eyes: Negative.   Respiratory: Negative.   Cardiovascular: Negative.   Gastrointestinal: Negative.   Genitourinary: Negative.   Musculoskeletal: Negative.   Skin: Negative.   Neurological: Negative.   Hematological: Negative.   Psychiatric/Behavioral: Negative.    No past medical history on file.  History   Social History  . Marital Status: Married    Spouse Name: N/A    Number of Children: N/A  . Years of Education: N/A   Occupational History  . Not on file.   Social History Main Topics  . Smoking status: Never Smoker   . Smokeless tobacco: Not on file  . Alcohol Use: Yes  . Drug Use: No  . Sexually Active:    Other Topics Concern  . Not on file   Social History Narrative  . No narrative on file    No past surgical history on file.  Family History  Problem Relation Age of Onset  . Depression Mother   . Alcohol abuse Mother   . Alcohol abuse Father   . Alcohol abuse Maternal Grandfather     No Known Allergies  Current Outpatient  Prescriptions on File Prior to Visit  Medication Sig Dispense Refill  . ALPRAZolam (XANAX) 0.5 MG tablet Take 0.5 mg by mouth 3 (three) times daily as needed.      Marland Kitchen FLUoxetine (PROZAC) 10 MG capsule Take 1 capsule (10 mg total) by mouth daily.  30 capsule  3  . loratadine (CLARITIN) 10 MG tablet Take 10 mg by mouth daily.      Marland Kitchen zolpidem (AMBIEN) 10 MG tablet Take 10 mg by mouth at bedtime as needed.        BP 118/76  Temp 99 F (37.2 C) (Oral)  Wt 180 lb (81.647 kg)chart    Objective:   Physical Exam  Constitutional: She is oriented to person, place, and time. She appears well-developed and well-nourished.  HENT:  Head: Normocephalic and atraumatic.  Right Ear: External ear normal.  Left Ear: External ear normal.  Nose: Nose normal.  Mouth/Throat: Oropharynx is clear and moist.  Eyes: Conjunctivae and EOM are normal. Pupils are equal, round, and reactive to light.  Neck: Normal range of motion. Neck supple.  Cardiovascular: Normal rate, regular rhythm and intact distal pulses.  Exam reveals friction rub. Exam reveals no gallop.   Murmur heard. Pulmonary/Chest: Effort normal and breath sounds normal.  Abdominal: Soft. Bowel sounds are normal.  Genitourinary:       Deferred to GYN  Musculoskeletal: Normal range of motion.  Neurological: She is alert and oriented to person, place, and time.  She has normal reflexes.  Skin: Skin is warm and dry.  Psychiatric: She has a normal mood and affect.          Assessment & Plan:  Assessment: CPX, Anxiety  Plan: Labs sent to include BMP, CBC, TSH, lipids will notify patient pending results. Encouraged a healthy diet, exercise, and weight reduction. Will follow-up with patient in August to discuss to discuss short-term disability and returning back to work.

## 2011-11-12 NOTE — Patient Instructions (Addendum)

## 2011-11-17 ENCOUNTER — Other Ambulatory Visit: Payer: Self-pay

## 2011-11-17 MED ORDER — ALPRAZOLAM 0.5 MG PO TABS: ORAL_TABLET | ORAL | Status: DC

## 2011-12-09 ENCOUNTER — Ambulatory Visit (INDEPENDENT_AMBULATORY_CARE_PROVIDER_SITE_OTHER): Payer: BC Managed Care – PPO | Admitting: Family

## 2011-12-09 ENCOUNTER — Encounter: Payer: Self-pay | Admitting: Family

## 2011-12-09 VITALS — BP 122/80 | HR 103 | Temp 99.0°F | Wt 182.0 lb

## 2011-12-09 DIAGNOSIS — G4700 Insomnia, unspecified: Secondary | ICD-10-CM

## 2011-12-09 DIAGNOSIS — F411 Generalized anxiety disorder: Secondary | ICD-10-CM

## 2011-12-09 DIAGNOSIS — F419 Anxiety disorder, unspecified: Secondary | ICD-10-CM

## 2011-12-09 NOTE — Progress Notes (Signed)
  Subjective:    Patient ID: Dawn Scott, female    DOB: [birth year], 41 y.o.   MRN: [id number]  HPI 41 year old white female, nonsmoker, is in for recheck of anxiety and depression. She has improved and is doing much better. But still. She needs more time before going back to work. 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 She's currently taken melatonin that isn't working well to help her sleep. Her daughter still having a difficult time dealing with the death of her father. Therefore, Dawn Scott felt like she needs to be there.   Patient plans to go son [facility] branch retreat in [place] for psychiatric retreat.  Review of Systems  Constitutional: Negative.   HENT: Negative.   Respiratory: Negative.   Cardiovascular: Negative.   Gastrointestinal: Negative.   Musculoskeletal: Negative.   Skin: Negative.   Neurological: Negative.   Hematological: Negative.   Psychiatric/Behavioral: Negative.    No past medical history on file.  History   Social History  . Marital Status: Married    Spouse Name: N/A    Number of Children: N/A  . Years of Education: N/A   Occupational History  . Not on file.   Social History Main Topics  . Smoking status: Never Smoker   . Smokeless tobacco: Not on file  . Alcohol Use: Yes  . Drug Use: No  . Sexually Active:    Other Topics Concern  . Not on file   Social History Narrative  . No narrative on file    No past surgical history on file.  Family History  Problem Relation Age of Onset  . Depression Mother   . Alcohol abuse Mother   . Alcohol abuse Father   . Alcohol abuse Maternal Grandfather     No Known Allergies  Current Outpatient Prescriptions on File Prior to Visit  Medication Sig Dispense Refill  . ALPRAZolam (XANAX) 0.5 MG tablet Take 1/2 to 1 tablet three time daily as needed  30 tablet  1  . FLUoxetine (PROZAC) 10 MG capsule Take 1 capsule (10 mg total) by mouth daily.  30  capsule  3  . loratadine (CLARITIN) 10 MG tablet Take 10 mg by mouth daily.        BP 122/80  Pulse 103  Temp 99 F (37.2 C) (Oral)  Wt 182 lb (82.555 kg)  SpO2 98%chart    Objective:   Physical Exam  Constitutional: She is oriented to person, place, and time. She appears well-developed and well-nourished.  Neck: Normal range of motion. Neck supple. No thyromegaly present.  Cardiovascular: Normal rate, regular rhythm and normal heart sounds.   Pulmonary/Chest: Effort normal and breath sounds normal.  Musculoskeletal: Normal range of motion.  Neurological: She is alert and oriented to person, place, and time.  Skin: Skin is warm and dry.  Psychiatric: She has a normal mood and affect.          Assessment & Plan:   Assessment: Anxiety, depression  Plan: Continue current medications. Note given to be out of work through 02/06/2012. We'll bring patient back for recheck a week prior. Encouraged healthy diet and exercise.

## 2012-01-02 ENCOUNTER — Encounter: Payer: Self-pay | Admitting: Family

## 2012-01-02 ENCOUNTER — Ambulatory Visit (INDEPENDENT_AMBULATORY_CARE_PROVIDER_SITE_OTHER): Payer: BC Managed Care – PPO | Admitting: Family

## 2012-01-02 VITALS — BP 116/80 | HR 87 | Temp 98.9°F | Wt 186.0 lb

## 2012-01-02 DIAGNOSIS — F419 Anxiety disorder, unspecified: Secondary | ICD-10-CM

## 2012-01-02 DIAGNOSIS — F3289 Other specified depressive episodes: Secondary | ICD-10-CM

## 2012-01-02 DIAGNOSIS — F411 Generalized anxiety disorder: Secondary | ICD-10-CM

## 2012-01-02 DIAGNOSIS — F329 Major depressive disorder, single episode, unspecified: Secondary | ICD-10-CM

## 2012-01-02 DIAGNOSIS — F32A Depression, unspecified: Secondary | ICD-10-CM

## 2012-01-02 NOTE — Progress Notes (Signed)
  Subjective:    Patient ID: Dawn Scott, female    DOB: 1971-03-30, 41 y.o.   MRN: [id number]  HPI 41 year old WF, nonsmoker, is in for a recheck of anxiety. She is currently tolerating meds well. She had an acute episode of anxiety yesterday while attempting to clean out her husbands closet. She feels better today. She is seeing a therapist through Hospice and a Catering manager. Patient is planning to file a wrongful death suit after her husbands autopsy report comes back in 04-10-2023. She does not believe she can mentally withstand court/lawsuit and work too. 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    Review of Systems  Constitutional: Negative.   Respiratory: Negative.   Cardiovascular: Negative.   Skin: Negative.   Hematological: Negative.   Psychiatric/Behavioral: Negative for suicidal ideas, disturbed wake/sleep cycle and self-injury. The patient is nervous/anxious.    No past medical history on file.  History   Social History  . Marital Status: Married    Spouse Name: N/A    Number of Children: N/A  . Years of Education: N/A   Occupational History  . Not on file.   Social History Main Topics  . Smoking status: Never Smoker   . Smokeless tobacco: Not on file  . Alcohol Use: Yes  . Drug Use: No  . Sexually Active:    Other Topics Concern  . Not on file   Social History Narrative  . No narrative on file    No past surgical history on file.  Family History  Problem Relation Age of Onset  . Depression Mother   . Alcohol abuse Mother   . Alcohol abuse Father   . Alcohol abuse Maternal Grandfather     No Known Allergies  Current Outpatient Prescriptions on File Prior to Visit  Medication Sig Dispense Refill  . ALPRAZolam (XANAX) 0.5 MG tablet Take 1/2 to 1 tablet three time daily as needed  30 tablet  1  . FLUoxetine (PROZAC) 10 MG capsule Take 1 capsule (10 mg total) by mouth daily.  30 capsule  3  . loratadine (CLARITIN) 10 MG tablet Take  10 mg by mouth daily.      Marland Kitchen MELATONIN GUMMIES PO Take 3 mg by mouth.        BP 116/80  Pulse 87  Temp 98.9 F (37.2 C) (Oral)  Wt 186 lb (84.369 kg)  SpO2 99%chart    Objective:   Physical Exam  Constitutional: She is oriented to person, place, and time. She appears well-developed and well-nourished.  Cardiovascular: Normal rate, regular rhythm and normal heart sounds.   Pulmonary/Chest: Effort normal and breath sounds normal.  Neurological: She is alert and oriented to person, place, and time.  Skin: Skin is warm and dry.  Psychiatric: She has a normal mood and affect.          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 She will begin going to court for wrongful death in Apr 10, 2023. She is currently tolerating medications well. I will recheck her in 6 months and sooner as needed.

## 2012-01-06 ENCOUNTER — Other Ambulatory Visit: Payer: Self-pay

## 2012-01-06 MED ORDER — ALPRAZOLAM 0.5 MG PO TABS: ORAL_TABLET | ORAL | Status: DC

## 2012-01-26 ENCOUNTER — Ambulatory Visit (INDEPENDENT_AMBULATORY_CARE_PROVIDER_SITE_OTHER): Payer: BC Managed Care – PPO | Admitting: Family

## 2012-01-26 ENCOUNTER — Encounter: Payer: Self-pay | Admitting: Family

## 2012-01-26 VITALS — BP 128/90 | HR 80 | Wt 190.0 lb

## 2012-01-26 DIAGNOSIS — F4310 Post-traumatic stress disorder, unspecified: Secondary | ICD-10-CM

## 2012-01-26 DIAGNOSIS — F419 Anxiety disorder, unspecified: Secondary | ICD-10-CM

## 2012-01-26 DIAGNOSIS — F411 Generalized anxiety disorder: Secondary | ICD-10-CM

## 2012-01-26 MED ORDER — FLUOXETINE HCL 10 MG PO CAPS: 10.0000 mg | ORAL_CAPSULE | Freq: Two times a day (BID) | ORAL | Status: DC

## 2012-01-26 NOTE — Patient Instructions (Signed)
The patient is instructed to continue all medications as prescribed. Schedule followup with check out clerk upon leaving the clinic  

## 2012-01-26 NOTE — Progress Notes (Signed)
  Subjective:    Patient ID: Dawn Scott, female    DOB: 10/08/1970, 41 y.o.   MRN: [id number]  HPI 41 year old white female, nonsmoker is in for recheck of anxiety. She reports feeling more anxious at bedtime and is requesting to go up on her Prozac. She is currently taken 10 mg in the mornings. She also takes Xanax one half of a 0.5 mg tablet 3 times a day. She is also undergoing counseling through hospice and with a private counselor, Mariane Masters. She is continuing to have a difficult time adjusting to the tragic loss of her husband in May 2013. She denies any feelings of helplessness, hopelessness, thoughts of death or dying.   Review of Systems  Constitutional: Negative.   Respiratory: Negative.   Cardiovascular: Negative.   Skin: Negative.   Hematological: Negative.   Psychiatric/Behavioral: Positive for disturbed wake/sleep cycle. Negative for agitation. The patient is nervous/anxious.    No past medical history on file.  History   Social History  . Marital Status: Married    Spouse Name: N/A    Number of Children: N/A  . Years of Education: N/A   Occupational History  . Not on file.   Social History Main Topics  . Smoking status: Never Smoker   . Smokeless tobacco: Not on file  . Alcohol Use: Yes  . Drug Use: No  . Sexually Active:    Other Topics Concern  . Not on file   Social History Narrative  . No narrative on file    No past surgical history on file.  Family History  Problem Relation Age of Onset  . Depression Mother   . Alcohol abuse Mother   . Alcohol abuse Father   . Alcohol abuse Maternal Grandfather     No Known Allergies  Current Outpatient Prescriptions on File Prior to Visit  Medication Sig Dispense Refill  . ALPRAZolam (XANAX) 0.5 MG tablet Take 1/2 to 1 tablet three time daily as needed  30 tablet  1  . loratadine (CLARITIN) 10 MG tablet Take 10 mg by mouth daily.      Marland Kitchen MELATONIN GUMMIES PO Take 3 mg by mouth.      . DISCONTD:  FLUoxetine (PROZAC) 10 MG capsule Take 1 capsule (10 mg total) by mouth daily.  30 capsule  3    BP 128/90  Pulse 80  Wt 190 lb (86.183 kg)  SpO2 98%chart    Objective:   Physical Exam  Constitutional: She is oriented to person, place, and time. She appears well-developed and well-nourished.  Neck: Normal range of motion. Neck supple.  Cardiovascular: Normal rate, regular rhythm and normal heart sounds.   Pulmonary/Chest: Effort normal and breath sounds normal.  Neurological: She is alert and oriented to person, place, and time.  Skin: Skin is warm and dry.  Psychiatric: She has a normal mood and affect.      Influenza vaccine administered.    Assessment & Plan:  Assessment: Anxiety, posttraumatic stress  Plan: Increase Prozac to 10 mg twice daily. Continue Xanax when necessary. Continue counseling sessions. We'll bring patient back for recheck in 3 weeks and sooner when necessary.

## 2012-01-27 ENCOUNTER — Ambulatory Visit: Payer: BC Managed Care – PPO | Admitting: Family

## 2012-02-03 ENCOUNTER — Telehealth: Payer: Self-pay | Admitting: Family

## 2012-02-03 ENCOUNTER — Other Ambulatory Visit: Payer: Self-pay | Admitting: Family

## 2012-02-03 NOTE — Telephone Encounter (Signed)
Pt called and said that [facility] in [place] would not fill pts script for FLUoxetine (PROZAC) 10 MG capsule pt takes 2 per day. Pt said that [facility] [place] was extremely rude, pt req [facility] [address]

## 2012-02-03 NOTE — Telephone Encounter (Signed)
Complete misunderstanding. Pt's last Rx for prozac was accidentally sent to [facility] with the new instructions for bid and pt called pharmacy with Rx number for the old instructions for qd. [place] [facility]. I spoke with pt to explain the misunderstanding and she was fine before getting off the phone and states that she will pick meds up form [facility]

## 2012-02-16 ENCOUNTER — Encounter: Payer: Self-pay | Admitting: Family

## 2012-02-16 ENCOUNTER — Ambulatory Visit (INDEPENDENT_AMBULATORY_CARE_PROVIDER_SITE_OTHER): Payer: BC Managed Care – PPO | Admitting: Family

## 2012-02-16 VITALS — BP 114/80 | HR 88 | Temp 98.2°F | Wt 185.0 lb

## 2012-02-16 DIAGNOSIS — M898X1 Other specified disorders of bone, shoulder: Secondary | ICD-10-CM

## 2012-02-16 DIAGNOSIS — M899 Disorder of bone, unspecified: Secondary | ICD-10-CM

## 2012-02-16 MED ORDER — CYCLOBENZAPRINE HCL 5 MG PO TABS: 5.0000 mg | ORAL_TABLET | Freq: Three times a day (TID) | ORAL | Status: DC | PRN

## 2012-02-16 NOTE — Progress Notes (Signed)
Subjective:    Patient ID: Dawn Scott, female    DOB: 11-28-1970, 41 y.o.   MRN: [id number]  HPI 41 year old white female, nonsmoker is in with complaints of left scapular pain that she describes as a dull has been going on for 3 weeks. Patient recently started playing golf about once a week. Rates the pain a 5/10 that has improved over the last couple weeks. She has concerns because it still bear. She's been applying ice, heat, and has tried to rest her shoulders. Patient denies any shortness of breath or chest pain. She uses Mirena for birth control.  Patient has a history of anxiety. At her last office visit she wants increase her Prozac to one tablet twice a day. She has also decreased her Xanax to 1/4 tablet twice a day. She reports being unable to sleep at night when she takes it twice a day. Therefore, she has decreased her dosage to 1 tablet daily. Reports anxiety been well controlled.   Review of Systems  Constitutional: Negative.   Eyes: Negative.   Respiratory: Negative.  Negative for cough, shortness of breath and wheezing.   Cardiovascular: Negative.  Negative for chest pain and palpitations.  Gastrointestinal: Negative.   Musculoskeletal: Positive for myalgias and back pain.       Left scapula pain  Skin: Negative.   Neurological: Negative.   Hematological: Negative.   Psychiatric/Behavioral: Negative.    No past medical history on file.  History   Social History  . Marital Status: Married    Spouse Name: N/A    Number of Children: N/A  . Years of Education: N/A   Occupational History  . Not on file.   Social History Main Topics  . Smoking status: Never Smoker   . Smokeless tobacco: Not on file  . Alcohol Use: Yes  . Drug Use: No  . Sexually Active:    Other Topics Concern  . Not on file   Social History Narrative  . No narrative on file    No past surgical history on file.  Family History  Problem Relation Age of Onset  . Depression Mother   .  Alcohol abuse Mother   . Alcohol abuse Father   . Alcohol abuse Maternal Grandfather     No Known Allergies  Current Outpatient Prescriptions on File Prior to Visit  Medication Sig Dispense Refill  . ALPRAZolam (XANAX) 0.5 MG tablet Take 1/2 to 1 tablet three time daily as needed  30 tablet  1  . FLUoxetine (PROZAC) 10 MG capsule TAKE ONE CAPSULE BY MOUTH EVERY DAY  30 capsule  3  . loratadine (CLARITIN) 10 MG tablet Take 10 mg by mouth daily.      Marland Kitchen MELATONIN GUMMIES PO Take 3 mg by mouth.      Marland Kitchen FLUoxetine (PROZAC) 10 MG capsule Take 1 capsule (10 mg total) by mouth 2 (two) times daily.  60 capsule  3    BP 114/80  Pulse 88  Temp 98.2 F (36.8 C) (Oral)  Wt 185 lb (83.915 kg)  SpO2 98%chart    Objective:   Physical Exam  Constitutional: She is oriented to person, place, and time. She appears well-developed and well-nourished.  HENT:  Right Ear: External ear normal.  Left Ear: External ear normal.  Mouth/Throat: Oropharynx is clear and moist.  Neck: Normal range of motion. Neck supple.  Cardiovascular: Normal rate, regular rhythm and normal heart sounds.  Exam reveals no gallop and no friction rub.  No murmur heard. Pulmonary/Chest: Effort normal and breath sounds normal. She has no wheezes. She exhibits no tenderness.  Musculoskeletal:       Arms: Neurological: She is alert and oriented to person, place, and time.  Skin: Skin is warm and dry.          Assessment & Plan:  Assessment: Left scapula pain-uncontrolled, muscle strain-uncontrolled, anxiety-improved  Plan: Flexeril 5 mg one tablet 3 times a day. Warned of drowsiness. Continue ice and heat to the affected area. Continue Prozac 10 mg once daily. Advised that she has the option of taking both tablets in the morning, but patient would rather stick to 10 mg a day. Advised to call the office if symptoms worsen or persist. Recheck a schedule, and when necessary.

## 2012-02-16 NOTE — Patient Instructions (Addendum)
Muscle Strain °Muscle strain occurs when a muscle is stretched beyond its normal length. A small number of muscle fibers generally are torn. This is especially common in athletes. This happens when a sudden, violent force placed on a muscle stretches it too far. Usually, recovery from muscle strain takes 1 to 2 weeks. Complete healing will take 5 to 6 weeks.  °HOME CARE INSTRUCTIONS  °· While awake, apply ice to the sore muscle for the first 2 days after the injury. °· Put ice in a plastic bag. °· Place a towel between your skin and the bag. °· Leave the ice on for 15 to 20 minutes each hour. °· Do not use the strained muscle for several days, until you no longer have pain. °· You may wrap the injured area with an elastic bandage for comfort. Be careful not to wrap it too tightly. This may interfere with blood circulation or increase swelling. °· Only take over-the-counter or prescription medicines for pain, discomfort, or fever as directed by your caregiver. °SEEK MEDICAL CARE IF:  °You have increasing pain or swelling in the injured area. °MAKE SURE YOU:  °· Understand these instructions. °· Will watch your condition. °· Will get help right away if you are not doing well or get worse. °Document Released: 04/14/2005 Document Revised: 07/07/2011 Document Reviewed: 04/26/2011 °[employer]® Patient Information ©2013 [employer]. ° °

## 2012-02-25 ENCOUNTER — Other Ambulatory Visit: Payer: Self-pay | Admitting: Family Medicine

## 2012-04-02 ENCOUNTER — Encounter: Payer: Self-pay | Admitting: Family

## 2012-04-02 ENCOUNTER — Ambulatory Visit (INDEPENDENT_AMBULATORY_CARE_PROVIDER_SITE_OTHER): Payer: BC Managed Care – PPO | Admitting: Family

## 2012-04-02 VITALS — BP 108/80 | HR 80 | Temp 98.7°F | Wt 183.0 lb

## 2012-04-02 DIAGNOSIS — F419 Anxiety disorder, unspecified: Secondary | ICD-10-CM

## 2012-04-02 DIAGNOSIS — F32A Depression, unspecified: Secondary | ICD-10-CM

## 2012-04-02 DIAGNOSIS — G4700 Insomnia, unspecified: Secondary | ICD-10-CM

## 2012-04-02 DIAGNOSIS — F3289 Other specified depressive episodes: Secondary | ICD-10-CM

## 2012-04-02 DIAGNOSIS — F411 Generalized anxiety disorder: Secondary | ICD-10-CM

## 2012-04-02 DIAGNOSIS — F329 Major depressive disorder, single episode, unspecified: Secondary | ICD-10-CM

## 2012-04-02 NOTE — Patient Instructions (Addendum)

## 2012-04-02 NOTE — Progress Notes (Signed)
Subjective:    Patient ID: Dawn Scott, female    DOB: January 06, 1971, 41 y.o.   MRN: [id number]  HPI 41 year old WF, nonsmoker, is in for a recheck of anxiety and depression. Patient has been having a rough time during the holidays after the accidental death of her husband in 17-Sep-2022. She sees a therapist and a grief counselor that helps. She has not been sleeping well at night lately. Has difficulty falling asleep and maintaining sleep.    Review of Systems  Constitutional: Negative.   HENT: Negative.   Respiratory: Negative.   Cardiovascular: Negative.   Gastrointestinal: Negative.   Musculoskeletal: Negative.   Neurological: Negative.   Hematological: Negative.   Psychiatric/Behavioral: Positive for sleep disturbance. The patient is nervous/anxious.    No past medical history on file.  History   Social History  . Marital Status: Married    Spouse Name: N/A    Number of Children: N/A  . Years of Education: N/A   Occupational History  . Not on file.   Social History Main Topics  . Smoking status: Never Smoker   . Smokeless tobacco: Not on file  . Alcohol Use: Yes  . Drug Use: No  . Sexually Active:    Other Topics Concern  . Not on file   Social History Narrative  . No narrative on file    No past surgical history on file.  Family History  Problem Relation Age of Onset  . Depression Mother   . Alcohol abuse Mother   . Alcohol abuse Father   . Alcohol abuse Maternal Grandfather     No Known Allergies  Current Outpatient Prescriptions on File Prior to Visit  Medication Sig Dispense Refill  . ALPRAZolam (XANAX) 0.5 MG tablet TAKE 1/2 -1 TABLET BY MOUTH THREE TIMES A DAY AS NEEDED  30 tablet  1  . cyclobenzaprine (FLEXERIL) 5 MG tablet Take 1 tablet (5 mg total) by mouth 3 (three) times daily as needed for muscle spasms.  30 tablet  0  . FLUoxetine (PROZAC) 10 MG capsule Take 1 capsule (10 mg total) by mouth 2 (two) times daily.  60 capsule  3  . FLUoxetine  (PROZAC) 10 MG capsule TAKE ONE CAPSULE BY MOUTH EVERY DAY  30 capsule  3  . loratadine (CLARITIN) 10 MG tablet Take 10 mg by mouth daily.      Marland Kitchen MELATONIN GUMMIES PO Take 3 mg by mouth.        BP 108/80  Pulse 80  Temp 98.7 F (37.1 C) (Oral)  Wt 183 lb (83.008 kg)  SpO2 96%chart    Objective:   Physical Exam  Constitutional: She is oriented to person, place, and time. She appears well-developed and well-nourished.  HENT:  Right Ear: External ear normal.  Left Ear: External ear normal.  Nose: Nose normal.  Mouth/Throat: Oropharynx is clear and moist.  Neck: Normal range of motion. Neck supple. No thyromegaly present.  Cardiovascular: Normal rate, regular rhythm and normal heart sounds.   Pulmonary/Chest: Effort normal and breath sounds normal.  Abdominal: Soft. Bowel sounds are normal.  Musculoskeletal: Normal range of motion.  Neurological: She is alert and oriented to person, place, and time.  Skin: Skin is warm and dry.  Psychiatric: She has a normal mood and affect.          Assessment & Plan:  Assessment: Anxiety, Depression  Plan: Xanax at bedtime only. Continue Prozac. Encouraged exercise. Call the office with any questions or  concerns. Continue therapy. Recheck in 3 months and sooner as needed.

## 2012-04-16 ENCOUNTER — Telehealth: Payer: Self-pay | Admitting: Family

## 2012-04-16 MED ORDER — TRAZODONE HCL 50 MG PO TABS: 50.0000 mg | ORAL_TABLET | Freq: Every evening | ORAL | Status: DC | PRN

## 2012-04-16 NOTE — Telephone Encounter (Signed)
Linette called. Was here 2wks ago w/ trouble sleeping. Has tried things recommended at that last visit, but is still having trouble sleeping. Wondering if something could be called in to help? Pt has used Ambien in the past, but she felt "hung over" the next day. Wondering if there's another option. Pt uses [facility] in [place].

## 2012-04-16 NOTE — Telephone Encounter (Signed)
Per Padonda, trazodone 50mg  qhs prn.  Rx sent

## 2012-04-16 NOTE — Telephone Encounter (Signed)
Per Oran Rein, pt can continue prozac but can only take one trazodone qhs

## 2012-05-04 ENCOUNTER — Encounter: Payer: Self-pay | Admitting: Family

## 2012-05-04 ENCOUNTER — Ambulatory Visit (INDEPENDENT_AMBULATORY_CARE_PROVIDER_SITE_OTHER): Payer: BC Managed Care – PPO | Admitting: Family

## 2012-05-04 ENCOUNTER — Other Ambulatory Visit (HOSPITAL_COMMUNITY)
Admission: RE | Admit: 2012-05-04 | Discharge: 2012-05-04 | Disposition: A | Discharge status: Home / Self Care | Payer: BC Managed Care – PPO | Source: Ambulatory Visit | Attending: Family | Admitting: Family

## 2012-05-04 VITALS — BP 100/62 | Wt 183.0 lb

## 2012-05-04 DIAGNOSIS — G4700 Insomnia, unspecified: Secondary | ICD-10-CM

## 2012-05-04 DIAGNOSIS — F419 Anxiety disorder, unspecified: Secondary | ICD-10-CM

## 2012-05-04 DIAGNOSIS — F341 Dysthymic disorder: Secondary | ICD-10-CM

## 2012-05-04 DIAGNOSIS — N760 Acute vaginitis: Secondary | ICD-10-CM | POA: Insufficient documentation

## 2012-05-04 DIAGNOSIS — R300 Dysuria: Secondary | ICD-10-CM

## 2012-05-04 DIAGNOSIS — F32A Depression, unspecified: Secondary | ICD-10-CM

## 2012-05-04 DIAGNOSIS — N898 Other specified noninflammatory disorders of vagina: Secondary | ICD-10-CM

## 2012-05-04 LAB — POCT URINALYSIS DIPSTICK
Ketones, UA: NEGATIVE
Leukocytes, UA: NEGATIVE
Nitrite, UA: NEGATIVE
Protein, UA: NEGATIVE
Urobilinogen, UA: NEGATIVE

## 2012-05-04 MED ORDER — FLUCONAZOLE 150 MG PO TABS: 150.0000 mg | ORAL_TABLET | Freq: Once | ORAL | Status: DC

## 2012-05-04 MED ORDER — ESCITALOPRAM OXALATE 10 MG PO TABS: 10.0000 mg | ORAL_TABLET | Freq: Every day | ORAL | Status: DC

## 2012-05-04 NOTE — Progress Notes (Signed)
Subjective:    Patient ID: Dawn Scott, female    DOB: 1970/12/09, 42 y.o..o.   MRN: [id number]  HPI 42 year old white female, nonsmoker his in with concerns of increased anxiety and depression over the last several weeks. She continues to struggle with the death of her husband in 06/07/13unexpectedly. She had been taken Prozac 20 mg daily that was working well. She a recently found female companionship that is recently ended due to Complications of his divorce. She has found herself more withdrawn, depressed, forgetting to eat, overwhelmed, increasing crying spells and drowsy. As detailed associated with not being herself for her daughter. She takes Xanax a half tablet at lunch. Is having difficulty sleeping at night.  Patient has concerns of itchy, vaginal discharge x3 days. She is apply Monistat inside of the vaginal canal that has been helpful. Denies any sexual activity since her husband died. She had negative STD screening in April 2013.   Review of Systems  Constitutional: Negative.   Respiratory: Negative.   Cardiovascular: Negative.   Gastrointestinal: Negative.   Genitourinary: Negative.   Musculoskeletal: Negative.   Skin: Negative.   Neurological: Negative.   Hematological: Negative.   Psychiatric/Behavioral: Negative.    No past medical history on file.  History   Social History  . Marital Status: Married    Spouse Name: N/A    Number of Children: N/A  . Years of Education: N/A   Occupational History  . Not on file.   Social History Main Topics  . Smoking status: Never Smoker   . Smokeless tobacco: Not on file  . Alcohol Use: Yes  . Drug Use: No  . Sexually Active:    Other Topics Concern  . Not on file   Social History Narrative  . No narrative on file    No past surgical history on file.  Family History  Problem Relation Age of Onset  . Depression Mother   . Alcohol abuse Mother   . Alcohol abuse Father   . Alcohol abuse Maternal Grandfather      No Known Allergies  Current Outpatient Prescriptions on File Prior to Visit  Medication Sig Dispense Refill  . ALPRAZolam (XANAX) 0.5 MG tablet TAKE 1/2 -1 TABLET BY MOUTH THREE TIMES A DAY AS NEEDED  30 tablet  1  . cyclobenzaprine (FLEXERIL) 5 MG tablet Take 1 tablet (5 mg total) by mouth 3 (three) times daily as needed for muscle spasms.  30 tablet  0  . loratadine (CLARITIN) 10 MG tablet Take 10 mg by mouth daily.      Marland Kitchen MELATONIN GUMMIES PO Take 3 mg by mouth.      . traZODone (DESYREL) 50 MG tablet Take 1 tablet (50 mg total) by mouth at bedtime as needed for sleep.  30 tablet  3  . escitalopram (LEXAPRO) 10 MG tablet Take 1 tablet (10 mg total) by mouth daily.  30 tablet  3    BP 100/62  Wt 183 lb (83.008 kg)chart    Objective:   Physical Exam  Constitutional: She is oriented to person, place, and time. She appears well-developed and well-nourished.  Neck: Neck supple.  Cardiovascular: Normal rate, regular rhythm and normal heart sounds.   Pulmonary/Chest: Effort normal and breath sounds normal.  Abdominal: Soft. Bowel sounds are normal.  Genitourinary: Uterus normal. Vaginal discharge found.       White, thick vaginal discharge.  Neurological: She is alert and oriented to person, place, and time.  Skin: Skin  is warm and dry.  Psychiatric: She has a normal mood and affect.          Assessment & Plan:  Assessment: Depression, Anxiety, Insomnia, Vaginal candida  Plan: Diflucan 150 mg by mouth x1. DC Prozac. Start Lexapro 10 mg once daily. Continue psychotherapy. Xanax 1 tablet at bedtime. Patient call the office if symptoms worsen or persist. Recheck in 2 weeks and sooner as needed.

## 2012-05-04 NOTE — Patient Instructions (Addendum)

## 2012-05-12 ENCOUNTER — Ambulatory Visit: Payer: BC Managed Care – PPO | Admitting: Family

## 2012-05-13 ENCOUNTER — Encounter: Payer: Self-pay | Admitting: Family

## 2012-05-13 ENCOUNTER — Ambulatory Visit (INDEPENDENT_AMBULATORY_CARE_PROVIDER_SITE_OTHER): Payer: BC Managed Care – PPO | Admitting: Family

## 2012-05-13 VITALS — BP 110/70 | HR 91 | Wt 183.0 lb

## 2012-05-13 DIAGNOSIS — F329 Major depressive disorder, single episode, unspecified: Secondary | ICD-10-CM

## 2012-05-13 DIAGNOSIS — F32A Depression, unspecified: Secondary | ICD-10-CM

## 2012-05-13 DIAGNOSIS — F419 Anxiety disorder, unspecified: Secondary | ICD-10-CM

## 2012-05-13 DIAGNOSIS — F411 Generalized anxiety disorder: Secondary | ICD-10-CM

## 2012-05-13 DIAGNOSIS — F3289 Other specified depressive episodes: Secondary | ICD-10-CM

## 2012-05-13 MED ORDER — ALPRAZOLAM 0.5 MG PO TABS: 0.5000 mg | ORAL_TABLET | Freq: Three times a day (TID) | ORAL | Status: DC | PRN

## 2012-05-13 MED ORDER — WELLBUTRIN XL 150 MG PO TB24: 150.0000 mg | ORAL_TABLET | Freq: Every day | ORAL | Status: DC

## 2012-05-13 NOTE — Patient Instructions (Addendum)

## 2012-05-13 NOTE — Progress Notes (Signed)
Subjective:    Patient ID: Dawn Scott, female    DOB: 01-Apr-1971, 42 y.o.   MRN: [id number]  HPI 42 year old white female, nonsmoke, is in with persistent depression and anxiety. 2 weeks ago she started Lexapro 10 mg and feels as if it's causing her to feel not. Does not have the ability to cry, be happy, be sad, doesn't sleep well, and does not want to get out of bed. She believes Lexapro has made her symptoms worse. She was on Prozac and doing well at 1 point and it stopped working. Reports family members, her mother and aunt being on Wellbutrin XL is working well. She would like to try it. She has feelings of helplessness and hopelessness, but denies any thoughts of death or dying. Reports having a good support system.   Review of Systems  Constitutional: Negative.   Respiratory: Negative.   Cardiovascular: Negative.   Gastrointestinal: Negative.   Musculoskeletal: Negative.   Skin: Negative.   Neurological: Negative.   Hematological: Negative.   Psychiatric/Behavioral: Positive for sleep disturbance, decreased concentration and agitation. Negative for suicidal ideas. The patient is nervous/anxious.    No past medical history on file.  History   Social History  . Marital Status: Married    Spouse Name: N/A    Number of Children: N/A  . Years of Education: N/A   Occupational History  . Not on file.   Social History Main Topics  . Smoking status: Never Smoker   . Smokeless tobacco: Not on file  . Alcohol Use: Yes  . Drug Use: No  . Sexually Active:    Other Topics Concern  . Not on file   Social History Narrative  . No narrative on file    No past surgical history on file.  Family History  Problem Relation Age of Onset  . Depression Mother   . Alcohol abuse Mother   . Alcohol abuse Father   . Alcohol abuse Maternal Grandfather     No Known Allergies  Current Outpatient Prescriptions on File Prior to Visit  Medication Sig Dispense Refill  .  cyclobenzaprine (FLEXERIL) 5 MG tablet Take 1 tablet (5 mg total) by mouth 3 (three) times daily as needed for muscle spasms.  30 tablet  0  . escitalopram (LEXAPRO) 10 MG tablet Take 1 tablet (10 mg total) by mouth daily.  30 tablet  3  . loratadine (CLARITIN) 10 MG tablet Take 10 mg by mouth daily.      Marland Kitchen MELATONIN GUMMIES PO Take 3 mg by mouth.      . fluconazole (DIFLUCAN) 150 MG tablet Take 1 tablet (150 mg total) by mouth once.  1 tablet  0  . traZODone (DESYREL) 50 MG tablet Take 1 tablet (50 mg total) by mouth at bedtime as needed for sleep.  30 tablet  3  . WELLBUTRIN XL 150 MG 24 hr tablet Take 1 tablet (150 mg total) by mouth daily.  30 tablet  3    BP 110/70  Pulse 91  Wt 183 lb (83.008 kg)  SpO2 99%chart    Objective:   Physical Exam  Constitutional: She is oriented to person, place, and time. She appears well-developed and well-nourished.  HENT:  Right Ear: External ear normal.  Left Ear: External ear normal.  Nose: Nose normal.  Mouth/Throat: Oropharynx is clear and moist.  Neck: Normal range of motion. Neck supple. No thyromegaly present.  Cardiovascular: Normal rate, regular rhythm and normal heart sounds.   Pulmonary/Chest:  Effort normal and breath sounds normal.  Abdominal: Soft. Bowel sounds are normal.  Musculoskeletal: Normal range of motion.  Neurological: She is alert and oriented to person, place, and time. She displays normal reflexes. No cranial nerve deficit. Coordination normal.  Skin: Skin is warm and dry.  Psychiatric: She has a normal mood and affect.          Assessment & Plan:  Plan: DC Lexapro. Start  Assessment: Depression, Anxiety  Plan: Start Wellbutrin XL 150 mg once a day. DC Lexapro. Continue current medications. Continue psychotherapy. Encouraged exercise daily. Followup in 2 weeks and sooner as needed.

## 2012-05-18 ENCOUNTER — Telehealth: Payer: Self-pay | Admitting: Family

## 2012-05-18 ENCOUNTER — Ambulatory Visit: Payer: BC Managed Care – PPO | Admitting: Family

## 2012-05-18 NOTE — Telephone Encounter (Signed)
Patient wants to double check that she is only supposed to take Welbutrin, not both Welbutrin and Lexapro - she doesn't know. States that w/Welbutrin only, she feels anxious. Please call to discuss.

## 2012-05-18 NOTE — Telephone Encounter (Signed)
Instructions state for pt to d/c Lexapro, however, Per Padonda, ok for pt to take both Wellbutrin and Lexapro together. Pt aware and verbalized understanding

## 2012-05-28 ENCOUNTER — Encounter: Payer: Self-pay | Admitting: Family

## 2012-05-28 ENCOUNTER — Ambulatory Visit (INDEPENDENT_AMBULATORY_CARE_PROVIDER_SITE_OTHER): Payer: BC Managed Care – PPO | Admitting: Family

## 2012-05-28 VITALS — BP 100/80 | HR 86 | Wt 176.0 lb

## 2012-05-28 DIAGNOSIS — F329 Major depressive disorder, single episode, unspecified: Secondary | ICD-10-CM

## 2012-05-28 DIAGNOSIS — F32A Depression, unspecified: Secondary | ICD-10-CM

## 2012-05-28 DIAGNOSIS — F419 Anxiety disorder, unspecified: Secondary | ICD-10-CM

## 2012-05-28 DIAGNOSIS — F3289 Other specified depressive episodes: Secondary | ICD-10-CM

## 2012-05-28 DIAGNOSIS — F411 Generalized anxiety disorder: Secondary | ICD-10-CM

## 2012-05-28 MED ORDER — FLUOXETINE HCL 10 MG PO TABS: 10.0000 mg | ORAL_TABLET | Freq: Every day | ORAL | Status: DC

## 2012-05-28 MED ORDER — WELLBUTRIN XL 300 MG PO TB24: 300.0000 mg | ORAL_TABLET | Freq: Every day | ORAL | Status: DC

## 2012-05-28 NOTE — Patient Instructions (Addendum)

## 2012-05-28 NOTE — Progress Notes (Signed)
  Subjective:    Patient ID: Dawn Scott, female    DOB: 1971/04/23, 42 y.o.   MRN: [id number]  HPI  42 year old white female, nonsmoker is in with persistent depression and anxiety. At her last office visit she does discontinued off Lexapro and started Wellbutrin 150 mg once a day. She's also seeing psychotherapy. Continues to be depressed and anxious. She has also lost approximately 7 pounds since her last office visit. Continues to have crying spells, feels depressed, anxious, decreased concentration an inability to sleep  Review of Systems  Constitutional: Negative.   Respiratory: Negative.   Cardiovascular: Negative.   Skin: Negative.   Psychiatric/Behavioral: Positive for confusion, sleep disturbance, decreased concentration and agitation. Negative for suicidal ideas and self-injury. The patient is nervous/anxious.    No past medical history on file.  History   Social History  . Marital Status: Married    Spouse Name: N/A    Number of Children: N/A  . Years of Education: N/A   Occupational History  . Not on file.   Social History Main Topics  . Smoking status: Never Smoker   . Smokeless tobacco: Not on file  . Alcohol Use: Yes  . Drug Use: No  . Sexually Active:    Other Topics Concern  . Not on file   Social History Narrative  . No narrative on file    No past surgical history on file.  Family History  Problem Relation Age of Onset  . Depression Mother   . Alcohol abuse Mother   . Alcohol abuse Father   . Alcohol abuse Maternal Grandfather     No Known Allergies  Current Outpatient Prescriptions on File Prior to Visit  Medication Sig Dispense Refill  . ALPRAZolam (XANAX) 0.5 MG tablet Take 1 tablet (0.5 mg total) by mouth 3 (three) times daily as needed for sleep.  90 tablet  1  . cyclobenzaprine (FLEXERIL) 5 MG tablet Take 1 tablet (5 mg total) by mouth 3 (three) times daily as needed for muscle spasms.  30 tablet  0  . fluconazole (DIFLUCAN) 150 MG  tablet Take 1 tablet (150 mg total) by mouth once.  1 tablet  0  . loratadine (CLARITIN) 10 MG tablet Take 10 mg by mouth daily.      Marland Kitchen MELATONIN GUMMIES PO Take 3 mg by mouth.      . traZODone (DESYREL) 50 MG tablet Take 1 tablet (50 mg total) by mouth at bedtime as needed for sleep.  30 tablet  3  . WELLBUTRIN XL 300 MG 24 hr tablet Take 1 tablet (300 mg total) by mouth daily.  30 tablet  2    BP 100/80  Pulse 86  Wt 176 lb (79.833 kg)  SpO2 98%chart    Objective:   Physical Exam  Constitutional: She is oriented to person, place, and time. She appears well-developed and well-nourished.  Cardiovascular: Normal rate, regular rhythm and normal heart sounds.   Pulmonary/Chest: Effort normal and breath sounds normal.  Neurological: She is alert and oriented to person, place, and time.  Skin: Skin is warm.  Psychiatric:       Flat affect.          Assessment & Plan:  Assessment: Depression-uncontrolled, Anxiety-uncontrolled, Insomnia-uncontrolled  Plan: Increase Wellbutrin to 300 mg XL once a day. Start Prozac 10 mg once a day. Continue all other medications the same. Continue to see psychotherapy. Recheck in 2 weeks and sooner as needed.

## 2012-06-08 ENCOUNTER — Ambulatory Visit (HOSPITAL_COMMUNITY)
Admission: RE | Admit: 2012-06-08 | Discharge: 2012-06-08 | Disposition: A | Discharge status: Home / Self Care | Payer: BC Managed Care – PPO | Attending: Psychiatry | Admitting: Psychiatry

## 2012-06-08 DIAGNOSIS — F4321 Adjustment disorder with depressed mood: Secondary | ICD-10-CM | POA: Insufficient documentation

## 2012-06-08 DIAGNOSIS — F329 Major depressive disorder, single episode, unspecified: Secondary | ICD-10-CM | POA: Insufficient documentation

## 2012-06-08 DIAGNOSIS — F411 Generalized anxiety disorder: Secondary | ICD-10-CM | POA: Insufficient documentation

## 2012-06-08 DIAGNOSIS — F3289 Other specified depressive episodes: Secondary | ICD-10-CM | POA: Insufficient documentation
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
## 2012-06-11 ENCOUNTER — Ambulatory Visit: Payer: BC Managed Care – PPO | Admitting: Family

## 2012-06-12 ENCOUNTER — Ambulatory Visit (INDEPENDENT_AMBULATORY_CARE_PROVIDER_SITE_OTHER): Payer: BC Managed Care – PPO | Admitting: Adult Health

## 2012-06-12 ENCOUNTER — Encounter: Payer: Self-pay | Admitting: Adult Health

## 2012-06-12 VITALS — BP 120/70 | HR 76 | Temp 98.4°F | Resp 18 | Wt 174.2 lb

## 2012-06-12 DIAGNOSIS — G4700 Insomnia, unspecified: Secondary | ICD-10-CM

## 2012-06-12 MED ORDER — ZOLPIDEM TARTRATE 5 MG PO TABS: 5.0000 mg | ORAL_TABLET | Freq: Every evening | ORAL | Status: DC | PRN

## 2012-06-12 NOTE — Patient Instructions (Addendum)
  I have prescribed Ambien 5 mg at bedtime as needed for sleep.  Ambien is habit forming. If you take this medication on a daily basis you will not be able to sleep without it.  You might want to choose 3 days a week where you want to have a restful night sleep and take it then.  Please follow up with your PCP. I know you had an appointment on Friday and we were closed due to weather.  Please call the office to reschedule your appointment.

## 2012-06-12 NOTE — Progress Notes (Signed)
  Subjective:    Patient ID: Dawn Scott, female    DOB: 07-06-1970, 42 y.o.   MRN: [id number]  HPI  Patient presents to clinic for c/o sleep disturbance. She reports last 4-5 days having trouble falling asleep then finds she is waking after 1 hour. Patient reports that she is worrying a lot. Her husband past away last May and she finds having trouble since.  Patient exercises in the morning so this is not interfering with sleep. She takes a hot bath prior to sleep. She reads to see if this is relaxing her. No caffeine except in the morning.  Patient is currently part of support group with [facility] in order to help her deal with her husband's unexpected and untimely death.   Review of Systems  Constitutional: Positive for appetite change.       Eating more but losing weight. She feels the Wellbutrin in contributing to this. Exercising as well  Respiratory: Negative for shortness of breath.   Cardiovascular: Negative for chest pain and palpitations.  Gastrointestinal: Negative for nausea, vomiting and diarrhea.  Psychiatric/Behavioral: Positive for sleep disturbance and decreased concentration. Negative for suicidal ideas and self-injury. The patient is nervous/anxious.        Improved energy    BP 120/70  Pulse 76  Temp(Src) 98.4 F (36.9 C) (Oral)  Resp 18  Wt 174 lb 3.2 oz (79.017 kg)  BMI 25.36 kg/m2     Objective:   Physical Exam  Constitutional: She is oriented to person, place, and time. She appears well-developed and well-nourished. No distress.  Cardiovascular: Normal rate and regular rhythm.   Neurological: She is alert and oriented to person, place, and time.  Skin: Skin is warm and dry.  Psychiatric: She has a normal mood and affect. Her behavior is normal. Thought content normal.          Assessment & Plan:

## 2012-06-12 NOTE — Assessment & Plan Note (Signed)
Start ambien 5 mg at bedtime as needed. Discussed medication is habit forming and some ideas regarding use such as choosing 3 nights she would like to have a restful night and taking it then. She will f/u with her PCP.

## 2012-06-13 ENCOUNTER — Encounter: Payer: Self-pay | Admitting: Family

## 2012-06-14 ENCOUNTER — Telehealth: Payer: Self-pay | Admitting: Family

## 2012-06-14 NOTE — Telephone Encounter (Signed)
Please write a letter on patients behalf. Thank you!  Dawn Scott, When you get a chance, can you please write a letter on my behalf explaining that I have been unable to travel due to my medical condition? I didn't take a flight because of my recent past and I am asking [employer] for a credit to use later. Please reference [employer] Customer Relations Case Name: [id number]. I am aware of the office fee for this. Thank you!!! Dawn Scott

## 2012-06-14 NOTE — Telephone Encounter (Signed)
[facility] Triage Call Report Triage Record Num: [id number] Operator: Roselyn Meier Patient Name: Dawn Scott Call Date & Time: 06/11/2012 12:37:33PM Patient Phone: [phone] PCP: Adline Mango Patient Gender: Female PCP Fax : Patient DOB: 08/17/1970 [facility] Name: [facility] Reason for Call: Caller: Cai/Patient; PCP: Adline Mango (Family Practice); CB#: [phone]; Call regarding Not sleeping; Onset 06/06/12 with not being able to sleep. Triaged patient per Sleep Disorders Protocol. See Provider within 72 Hours Disposition for 'Persistent insomnia resulting in less than 4 hours sleep per night and not previously evaluated'. Pt had appt scheduled for 06/11/12. RN scheduled appt for 06/12/12 at 10:15 with Dr Posey Rea at [facility] Protocol(s) Used: Sleep Disorders Recommended Outcome per Protocol: See Provider within 72 Hours Reason for Outcome: Persistent insomnia resulting in less than 4 hours sleep per night AND not previously evaluated Care Advice: ~ Call provider if symptoms worsen or new symptoms develop. 02/

## 2012-06-14 NOTE — Telephone Encounter (Signed)
na

## 2012-06-14 NOTE — Telephone Encounter (Signed)
Pt suffers from anxiety, depression, insomnia, and PTSD post the sudden death of her husband

## 2012-06-15 NOTE — Telephone Encounter (Signed)
Letter complete. Called pt & LMOM that it was ready.

## 2012-06-16 ENCOUNTER — Other Ambulatory Visit (HOSPITAL_COMMUNITY): Payer: BC Managed Care – PPO | Attending: Psychiatry | Admitting: Psychiatry

## 2012-06-16 ENCOUNTER — Encounter (HOSPITAL_COMMUNITY): Payer: Self-pay

## 2012-06-16 ENCOUNTER — Telehealth: Payer: Self-pay | Admitting: *Deleted

## 2012-06-16 DIAGNOSIS — F3289 Other specified depressive episodes: Secondary | ICD-10-CM

## 2012-06-16 DIAGNOSIS — F329 Major depressive disorder, single episode, unspecified: Secondary | ICD-10-CM | POA: Insufficient documentation

## 2012-06-16 DIAGNOSIS — F32A Depression, unspecified: Secondary | ICD-10-CM | POA: Insufficient documentation

## 2012-06-16 DIAGNOSIS — Z634 Disappearance and death of family member: Secondary | ICD-10-CM

## 2012-06-16 DIAGNOSIS — F411 Generalized anxiety disorder: Secondary | ICD-10-CM

## 2012-06-16 MED ORDER — CLONAZEPAM 1 MG PO TABS: 1.0000 mg | ORAL_TABLET | Freq: Every day | ORAL | Status: DC

## 2012-06-16 NOTE — Progress Notes (Unsigned)
    Daily Group Progress Note  Program: IOP  Group Time: 9:00-10:30 am   Participation Level: Active  Behavioral Response: Appropriate  Type of Therapy:  Process Group  Summary of Progress: Today was patients first day in the group. He appeared anxious at first, but after being introduced to the others, appeared more comfortable. She was attentive and observed the group process.      Group Time: 10:30 am - 12:00 pm   Participation Level:  Active  Behavioral Response: Appropriate  Type of Therapy: Psycho-education Group  Summary of Progress: Patient participated in an educational group on support services offered through the [employer] and learned how to access them to foster continued emotional wellness.   Carman Ching, LCSW

## 2012-06-16 NOTE — Telephone Encounter (Signed)
Dawn Scott started mental health outpatient program today. [facility] called)

## 2012-06-16 NOTE — Progress Notes (Unsigned)
Patient ID: Dawn Scott, female   DOB: [birth year], 43 y.o.   MRN: [id number] D: Patient is a 42 year old caucasian female referred to MH-IOP by Mariane Masters, LPC. Patient reports having increased depression and anxiety. She has always struggled with Depression and also has some grief issues. Patient has been inpatient previously in 09/13/1990 and was misdiagnosed with Bipolar Disorder. Patient has several stressors that affect her depression/anxiety: 1) Husband died in a work accident in 09-13-11. Patient has a lot of grief and anger over the death of her husband. 2) Due to husband death at work there is a pending lawsuit/settlement with his company. Patient is struggling with how to handle the lawsuit and not sure how it will turn out. 3) Pt is now a single parent of a 51 year old girl. She states she feels a lot of pressure to take care of her alone and has difficulty making decisions without her husband to talk to about them. 4) Finances are becoming problematic due to the death of husband and she has taken an LOA for one year from work in order to grieve. 5) Power/control issues. Patient explains that she knows she needs to reach out for help but has a difficult time letting go of the power or control in situations. This causes her anger and strained relationships. Denies any SI/HI or A/V hallucinations. Patient is able to contract for safety.  Family: Patient grew up in broken home. Parent divorced at age 60. She was raised by mother and stepfather. Father and Mother have Depression and some alcohol abuse.  Siblings: Pt has an older sister that is supportive but lives in [place], [place].  Children: Pt has a 9 year old daughter Education: Graduated from [employer] with a BFA. 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  Patient denies any drugs/ETOH.  States her support system is small consisting of mother, sister and a couple of close friends. In-laws are also supportive but are elderly.  Pt completed  all forms. Scored a 17 on BURNS. A: Oriented pt. Provided with an orientation folder. Encouraged support groups, Hospice, Grief support groups. Pt will attend group for 10 days. Notified Mariane Masters, LPC and Adline Mango, NP of starting group. R: Pt receptive

## 2012-06-17 ENCOUNTER — Encounter: Payer: Self-pay | Admitting: Family

## 2012-06-17 ENCOUNTER — Ambulatory Visit (INDEPENDENT_AMBULATORY_CARE_PROVIDER_SITE_OTHER): Payer: BC Managed Care – PPO | Admitting: Family

## 2012-06-17 ENCOUNTER — Other Ambulatory Visit (HOSPITAL_COMMUNITY): Payer: BC Managed Care – PPO | Attending: Psychiatry | Admitting: Psychiatry

## 2012-06-17 VITALS — BP 118/78 | HR 89 | Wt 176.0 lb

## 2012-06-17 DIAGNOSIS — F32A Depression, unspecified: Secondary | ICD-10-CM

## 2012-06-17 DIAGNOSIS — F3289 Other specified depressive episodes: Secondary | ICD-10-CM

## 2012-06-17 DIAGNOSIS — F411 Generalized anxiety disorder: Secondary | ICD-10-CM | POA: Insufficient documentation

## 2012-06-17 DIAGNOSIS — F329 Major depressive disorder, single episode, unspecified: Secondary | ICD-10-CM | POA: Insufficient documentation

## 2012-06-17 DIAGNOSIS — G4700 Insomnia, unspecified: Secondary | ICD-10-CM

## 2012-06-17 NOTE — Patient Instructions (Addendum)

## 2012-06-17 NOTE — Progress Notes (Signed)
Subjective:    Patient ID: Dawn Scott, female    DOB: 1970-07-17, 42 y.o.   MRN: [id number]  HPI 42 year old, nonsmoker, is in for recheck of anxiety and depression. At her last office visit she was increased to 300 mg a Wellbutrin XL once a day. She has improved significantly. She's also doing some group counseling. She continues to see Mariane Masters for psychotherapy. Has the feelings of helplessness, hopelessness, thoughts of death or dying.  Continues to have some difficulty maintaining sleep. Ambien 5 mg is not working effectively. Psychosis suggested switching Xanax to Klonopin 1 mg to help with sleep. She also has been taken Prozac. However, at her last office visit we discontinue Prozac.    Review of Systems  Constitutional: Negative.   HENT: Negative.   Respiratory: Negative.   Cardiovascular: Negative.   Gastrointestinal: Negative.   Genitourinary: Negative.   Musculoskeletal: Negative.   Allergic/Immunologic: Negative.   Neurological: Negative.   Hematological: Negative.   Psychiatric/Behavioral: Negative.    Past Medical History  Diagnosis Date  . Depression   . Anxiety     History   Social History  . Marital Status: Married    Spouse Name: N/A    Number of Children: N/A  . Years of Education: N/A   Occupational History  . Not on file.   Social History Main Topics  . Smoking status: Never Smoker   . Smokeless tobacco: Never Used  . Alcohol Use: No  . Drug Use: No  . Sexually Active: No   Other Topics Concern  . Not on file   Social History Narrative  . No narrative on file    Past Surgical History  Procedure Laterality Date  . Appendectomy      Family History  Problem Relation Age of Onset  . Depression Mother   . Alcohol abuse Father   . Depression Father   . Alcohol abuse Maternal Grandfather     No Known Allergies  Current Outpatient Prescriptions on File Prior to Visit  Medication Sig Dispense Refill  . clonazePAM (KLONOPIN) 1 MG  tablet Take 1 tablet (1 mg total) by mouth at bedtime.  30 tablet  0  . loratadine (CLARITIN) 10 MG tablet Take 10 mg by mouth daily as needed.       . WELLBUTRIN XL 300 MG 24 hr tablet Take 1 tablet (300 mg total) by mouth daily.  30 tablet  2  . zolpidem (AMBIEN) 5 MG tablet Take 1 tablet (5 mg total) by mouth at bedtime as needed for sleep.  30 tablet  1   No current facility-administered medications on file prior to visit.    BP 118/78  Pulse 89  Wt 176 lb (79.833 kg)  BMI 25.63 kg/m2  SpO2 99%chart    Objective:   Physical Exam  Constitutional: She appears well-developed and well-nourished.  HENT:  Right Ear: External ear normal.  Left Ear: External ear normal.  Mouth/Throat: Oropharynx is clear and moist.  Neck: Normal range of motion. Neck supple. No thyromegaly present.  Cardiovascular: Normal rate, regular rhythm and normal heart sounds.   Pulmonary/Chest: Effort normal.  Abdominal: Soft. Bowel sounds are normal.  Musculoskeletal: Normal range of motion.  Neurological: She is alert.  Skin: Skin is warm and dry.  Psychiatric: She has a normal mood and affect.          Assessment:   1. Anxiety 2. Depression 3. Insomnia assessment:& Plan:  Assessment  1 Depression 2. Anxiety 3.  Insomnia   Plan: Continue current medications. Continue psychotherapy. Recheck in 3 weeks to be sure that she still though all well.

## 2012-06-18 ENCOUNTER — Other Ambulatory Visit (HOSPITAL_COMMUNITY): Payer: BC Managed Care – PPO | Admitting: Psychiatry

## 2012-06-18 DIAGNOSIS — F329 Major depressive disorder, single episode, unspecified: Secondary | ICD-10-CM

## 2012-06-18 DIAGNOSIS — F32A Depression, unspecified: Secondary | ICD-10-CM

## 2012-06-18 NOTE — Patient Instructions (Signed)
Pt requesting discharge today.  Will follow up with Mariane Masters, [facility] and PCP Adline Mango, [place]).  Encouraged support groups.

## 2012-06-18 NOTE — Progress Notes (Signed)
Patient ID: Dawn Scott, female   DOB: 11-19-1970, 42 y.o.   MRN: [id number] D:  On 06/17/12, spoke to Crystal at [employer] re: precert.  She authorized twelve MH-IOP days with auth # [id number].

## 2012-06-18 NOTE — Progress Notes (Signed)
    Daily Group Progress Note  Program: IOP  Group Time: 9:00-10:30 am   Participation Level: Active  Behavioral Response: Appropriate  Type of Therapy:  Process Group  Summary of Progress: Patient told the group her story and how she lost her husband suddenly. She shared that she has always been very strong and never relied on other people for support. Patient is working through her grief and realized in group that she really has never allowed herself to fully grieve the loss of her husband. She also realized that she can be angry and sad and let her daughter see her emotions. She is afraid to let people see her emotions because she is afraid to be viewed as weak. The group reassured her that she needs time to grieve and to show her emotions.      Group Time: 10:30 am - 12:00 pm   Participation Level:  Active  Behavioral Response: Appropriate  Type of Therapy: Psycho-education Group  Summary of Progress: Patient participated in a skills training on managing difficult emotions and how to use mindfulness to sit with painful feelings instead of rejecting them.   Carman Ching, LCSW

## 2012-06-18 NOTE — Progress Notes (Signed)
    Daily Group Progress Note  Program: IOP  Group Time: 9:00-10:30 am   Participation Level: Active  Behavioral Response: Appropriate  Type of Therapy:  Process Group  Summary of Progress: Patient related to another member and processed her own grief and loss over the death of her husband while connecting with others in the group. Patient appeared to be doing very well and stated it helped to "not feel alone" and then patient immediatly asked case manager for discharge from the group today.      Group Time: 10:30 am - 12:00 pm   Participation Level:  Active  Behavioral Response: Appropriate  Type of Therapy: Psycho-education Group  Summary of Progress: Patient participated in a stress management technique called Progressive Muscle Relaxation and learned the benefits of relaxation to manage wellness.  Carman Ching, LCSW

## 2012-06-20 NOTE — Progress Notes (Signed)
Psychiatric Assessment Adult  Patient Identification:  Dawn Scott Date of Evaluation:  06/16/12 Chief Complaint: Depression and anxiety History of Chief Complaint:  42 year old caucasian female referred to MH-IOP by Mariane Masters, LPC. Patient reports having increased depression and anxiety. She has always struggled with Depression and also has some grief issues. Patient has been inpatient previously in 1990-09-30 and was misdiagnosed with Bipolar Disorder. Patient has several stressors that affect her depression/anxiety: 1) Husband died in a work accident in 09/30/2011. Patient has a lot of grief and anger over the death of her husband. 2) Due to husband death at work there is a pending lawsuit/settlement with his company. Patient is struggling with how to handle the lawsuit and not sure how it will turn out. 3) Pt is now a single parent of a 46 year old girl. She states she feels a lot of pressure to take care of her alone and has difficulty making decisions without her husband to talk to about them. 4) Finances are becoming problematic due to the death of husband and she has taken an LOA for one year from work in order to grieve. 5) Power/control issues. Patient explains that she knows she needs to reach out for help but has a difficult time letting go of the power or control in situations. This causes her anger and strained relationships. Denies any SI/HI or A/V hallucinations. Patient is able to contract for safety.  Family: Patient grew up in broken home. Parent divorced at age 60. She was raised by mother and stepfather. Father and Mother have Depression and some alcohol abuse.  Siblings: Pt has an older sister that is supportive but lives in [place], [place].  Children: Pt has a 47 year old daughter  Education: Graduated from [employer] with a BFA. 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  Patient denies any drugs/ETOH.  States her support system is small consisting of mother, sister and a  couple of close friends. In-laws are also supportive but are elderly.  Chief Complaint  Patient presents with  . Depression    HPI Review of Systems Physical Exam  Depressive Symptoms: depressed mood, anhedonia, insomnia, psychomotor retardation, fatigue, feelings of worthlessness/guilt, difficulty concentrating, hopelessness, impaired memory, anxiety, weight loss, increased appetite,  (Hypo) Manic Symptoms:  NONE  Anxiety Symptoms: Excessive Worry:  Yes Panic Symptoms:  No Agoraphobia:  Yes Obsessive Compulsive: No  Symptoms: None, Specific Phobias:  No Social Anxiety:  Yes  Psychotic Symptoms: NONE  PTSD Symptoms:NONE  Traumatic Brain Injury: No   Past Psychiatric History: Diagnosis: Depression  Hospitalizations: Inpatient unit at age 69 for Nervous breakdown.  Outpatient Care: Padonda campbell at [facility] RNP, was given Lexapro  Which made her feel numb. So switched to Prozac.  Substance Abuse Care:   Self-Mutilation:   Suicidal Attempts:   Violent Behaviors:    Past Medical History:   Past Medical History  Diagnosis Date  . Depression   . Anxiety    History of Loss of Consciousness:  No Seizure History:  No Cardiac History:  No Allergies:  No Known Allergies Current Medications:  Current Outpatient Prescriptions  Medication Sig Dispense Refill  . clonazePAM (KLONOPIN) 1 MG tablet Take 1 tablet (1 mg total) by mouth at bedtime.  30 tablet  0  . loratadine (CLARITIN) 10 MG tablet Take 10 mg by mouth daily as needed.       . WELLBUTRIN XL 300 MG 24 hr tablet Take 1  tablet (300 mg total) by mouth daily.  30 tablet  2  . zolpidem (AMBIEN) 5 MG tablet Take 1 tablet (5 mg total) by mouth at bedtime as needed for sleep.  30 tablet  1   No current facility-administered medications for this visit.    Previous Psychotropic Medications:  Medication Dose                          Substance Abuse History in the last 12 months:NOT  APPLICABLE Substance Age of 1st Use Last Use Amount Specific Type  Nicotine      Alcohol      Cannabis      Opiates      Cocaine      Methamphetamines      LSD      Ecstasy      Benzodiazepines      Caffeine      Inhalants      Others:                          Medical Consequences of Substance Abuse:   Legal Consequences of Substance Abuse:   Family Consequences of Substance Abuse:   Blackouts:  No DT's:  No Withdrawal Symptoms:  No None  Social History: Current Place of Residence:  Place of Birth:  Family Members:  Marital Status:  Widowed Children: 1  Sons:   Daughters:  Relationships:  Education:  Brewing technologist:  Religious Beliefs/Practices:  History of Abuse: none Teacher, music History:  None. Legal History:  Hobbies/Interests:   Family History:   Family History  Problem Relation Age of Onset  . Depression Mother   . Alcohol abuse Father   . Depression Father   . Alcohol abuse Maternal Grandfather     Mental Status Examination/Evaluation: Objective:  Appearance: Casual  Eye Contact::  Minimal  Speech:  Clear and Coherent  Volume:  Decreased  Mood:  Depressed and anxious  Affect:  Constricted and Depressed  Thought Process:  Goal Directed and Linear  Orientation:  Full (Time, Place, and Person)  Thought Content:  Obsessions and Rumination  Suicidal Thoughts:  No  Homicidal Thoughts:  No  Judgement:  Good  Insight:  Fair  Psychomotor Activity:  Decreased  Akathisia:  No  Handed:  Right  AIMS (if indicated):    Assets:  Communication Skills Desire for Improvement Housing Physical Health Resilience    Laboratory/X-Ray Psychological Evaluation(s)        Assessment:  Axis I: Generalized Anxiety Disorder and Major Depression, Recurrent severe  AXIS I Generalized Anxiety Disorder and Major Depression, Recurrent severe  AXIS II Deferred  AXIS III Past Medical History  Diagnosis Date  .  Depression   . Anxiety      AXIS IV other psychosocial or environmental problems, problems related to social environment and problems with primary support group  AXIS V 51-60 moderate symptoms   Treatment Plan/Recommendations:  Plan of Care: start IOP  Laboratory:  none  Psychotherapy: Group, Individual therapy  Medications: DC Prozac and xanax. Discussed R/R/B/O of Klonopin 1mg  po q hs and Remeron 15 mg po q hs for anxiety, and anxiety and depression , Pt gave me her informed consent  Routine PRN Medications:  Yes  Consultations:   Safety Concerns:  none  Other:  Est LOS-2 weeks.    Margit Banda, MD 06/16/12. 4.00PM.

## 2012-06-21 ENCOUNTER — Other Ambulatory Visit (HOSPITAL_COMMUNITY): Payer: BC Managed Care – PPO

## 2012-06-21 NOTE — Progress Notes (Signed)
Patient ID: Dawn Scott, female   DOB: [birth year], 42 y.o.   MRN: [id number] D:  Patient is a 42 year old caucasian female referred to MH-IOP by Mariane Masters, LPC. Patient reports having increased depression and anxiety. She has always struggled with depression and also has some grief issues. Patient has been inpatient previously in 1992 and was misdiagnosed with Bipolar Disorder.  Patient requested discharge from MH-IOP on 06-18-12, but this writer requested pt to return on today to see Dr. Rutherford Limerick.  Pt states the groups weren't helpful.  "It was difficult hearing others problems."  Pt reports improved sleep and appetite.  Denies any SI, HI or A/V hallucinations.  A:  D/C today.  (pt didn't attend the program today)  F/U with Mariane Masters, [facility] and PCP.  Encouraged support groups.  R:  Pt receptive.

## 2012-06-22 ENCOUNTER — Other Ambulatory Visit (HOSPITAL_COMMUNITY): Payer: BC Managed Care – PPO

## 2012-06-23 ENCOUNTER — Other Ambulatory Visit (HOSPITAL_COMMUNITY): Payer: BC Managed Care – PPO

## 2012-06-24 ENCOUNTER — Other Ambulatory Visit (HOSPITAL_COMMUNITY): Payer: BC Managed Care – PPO

## 2012-06-25 ENCOUNTER — Other Ambulatory Visit (HOSPITAL_COMMUNITY): Payer: BC Managed Care – PPO

## 2012-06-26 NOTE — Progress Notes (Signed)
Discharge Note  Patient:  Dawn Scott is an 42 y.o., female DOB:  February 10, 1971  Date of Admission:  06/16/12  Date of Discharge: 06/21/12  Reason for Admission:Depression and anxiety  Hospital Course: Pt started IOP and was started on Wellbutrin XL 300 mg q am and was put klonopin  1 mg po q hs . Her Prozac and xanax was DC., pt attended a few groups and felt Individual therapy was better for her so wanted dc.  With the med changes she improved her sleep, appetite were good with no panic attcks. She was coping better and tol her meds well.  Mental Status at Discharge: alert, O/3, affect-Full, mood-stable, no SI/ HI. No hallucinations/ delusions.  Lab Results: No results found for this or any previous visit (from the past 48 hour(s)).  Current outpatient prescriptions:clonazePAM (KLONOPIN) 1 MG tablet, Take 1 tablet (1 mg total) by mouth at bedtime., Disp: 30 tablet, Rfl: 0;  loratadine (CLARITIN) 10 MG tablet, Take 10 mg by mouth daily as needed. , Disp: , Rfl: ;  WELLBUTRIN XL 300 MG 24 hr tablet, Take 1 tablet (300 mg total) by mouth daily., Disp: 30 tablet, Rfl: 2  Axis Diagnosis:   Axis I: Generalized Anxiety Disorder and Major Depression, single episode Axis II: Deferred Axis III:  Past Medical History  Diagnosis Date  . Depression   . Anxiety    Axis IV: other psychosocial or environmental problems, problems related to social environment and problems with primary support group Axis V: 61-70 mild symptoms   Level of Care:  OP  Discharge destination:  Home  Is patient on multiple antipsychotic therapies at discharge:  No    Has Patient had three or more failed trials of antipsychotic monotherapy by history:  No  Patient phone:  [phone] (home)  Patient address:   [address] [place] [address],   Follow-up recommendations:  Activity:  as tolerated Diet:  regular Other:  follow up Radonda RNP at [facility] for meds and Mariane Masters at center for  psychotherapy   Comments:  na  The patient received suicide prevention pamphlet:  Yes   Margit Banda 06/26/2012, 8:15 PM

## 2012-06-28 ENCOUNTER — Other Ambulatory Visit (HOSPITAL_COMMUNITY): Payer: BC Managed Care – PPO

## 2012-06-29 ENCOUNTER — Other Ambulatory Visit (HOSPITAL_COMMUNITY): Payer: BC Managed Care – PPO

## 2012-06-30 ENCOUNTER — Other Ambulatory Visit (HOSPITAL_COMMUNITY): Payer: BC Managed Care – PPO

## 2012-07-01 ENCOUNTER — Ambulatory Visit: Payer: BC Managed Care – PPO | Admitting: Family

## 2012-07-02 ENCOUNTER — Other Ambulatory Visit (HOSPITAL_COMMUNITY): Payer: BC Managed Care – PPO

## 2012-07-05 ENCOUNTER — Other Ambulatory Visit (HOSPITAL_COMMUNITY): Payer: BC Managed Care – PPO

## 2012-07-06 ENCOUNTER — Other Ambulatory Visit (HOSPITAL_COMMUNITY): Payer: BC Managed Care – PPO

## 2012-07-07 ENCOUNTER — Other Ambulatory Visit (HOSPITAL_COMMUNITY): Payer: BC Managed Care – PPO

## 2012-07-08 ENCOUNTER — Ambulatory Visit: Payer: Self-pay | Admitting: Family

## 2012-07-09 ENCOUNTER — Ambulatory Visit (INDEPENDENT_AMBULATORY_CARE_PROVIDER_SITE_OTHER): Payer: BC Managed Care – PPO | Admitting: Family

## 2012-07-09 ENCOUNTER — Encounter: Payer: Self-pay | Admitting: Family

## 2012-07-09 VITALS — BP 112/62 | HR 88 | Wt 174.0 lb

## 2012-07-09 DIAGNOSIS — F3289 Other specified depressive episodes: Secondary | ICD-10-CM

## 2012-07-09 DIAGNOSIS — G4700 Insomnia, unspecified: Secondary | ICD-10-CM

## 2012-07-09 DIAGNOSIS — F411 Generalized anxiety disorder: Secondary | ICD-10-CM

## 2012-07-09 DIAGNOSIS — F329 Major depressive disorder, single episode, unspecified: Secondary | ICD-10-CM

## 2012-07-09 DIAGNOSIS — F32A Depression, unspecified: Secondary | ICD-10-CM

## 2012-07-09 MED ORDER — CLONAZEPAM 1 MG PO TABS: 1.0000 mg | ORAL_TABLET | Freq: Every day | ORAL | Status: DC

## 2012-07-09 NOTE — Progress Notes (Signed)
  Subjective:    Patient ID: Dawn Scott, female    DOB: 1970-06-10, 42 y.o.   MRN: [id number]  HPI 42 year old white female, nonsmoker is in for recheck of depression and anxiety. She's currently on Wellbutrin 300 mg XL once a day. She's also been taking Klonopin 1 mg at bedtime to help with insomnia. However, she has been feeling groggy next morning. She denies any feelings of helplessness, hopelessness, thoughts of death or dying.   Review of Systems  Constitutional: Negative.   Respiratory: Negative.   Cardiovascular: Negative.   Gastrointestinal: Negative.   Endocrine: Negative.   Genitourinary: Negative.   Musculoskeletal: Negative.   Skin: Negative.   Neurological: Negative.   Hematological: Negative.   Psychiatric/Behavioral: Negative.    Past Medical History  Diagnosis Date  . Depression   . Anxiety     History   Social History  . Marital Status: Married    Spouse Name: N/A    Number of Children: N/A  . Years of Education: N/A   Occupational History  . Not on file.   Social History Main Topics  . Smoking status: Never Smoker   . Smokeless tobacco: Never Used  . Alcohol Use: No  . Drug Use: No  . Sexually Active: No   Other Topics Concern  . Not on file   Social History Narrative  . No narrative on file    Past Surgical History  Procedure Laterality Date  . Appendectomy      Family History  Problem Relation Age of Onset  . Depression Mother   . Alcohol abuse Father   . Depression Father   . Alcohol abuse Maternal Grandfather     No Known Allergies  Current Outpatient Prescriptions on File Prior to Visit  Medication Sig Dispense Refill  . loratadine (CLARITIN) 10 MG tablet Take 10 mg by mouth daily as needed.       . WELLBUTRIN XL 300 MG 24 hr tablet Take 1 tablet (300 mg total) by mouth daily.  30 tablet  2   No current facility-administered medications on file prior to visit.    BP 112/62  Pulse 88  Wt 174 lb (78.926 kg)  BMI 25.34  kg/m2  SpO2 98%chart    Objective:   Physical Exam  Constitutional: She is oriented to person, place, and time. She appears well-developed and well-nourished.  Neck: Normal range of motion. Neck supple.  Cardiovascular: Normal rate, regular rhythm and normal heart sounds.   Pulmonary/Chest: Effort normal and breath sounds normal.  Musculoskeletal: Normal range of motion.  Neurological: She is alert and oriented to person, place, and time. She has normal reflexes. She displays normal reflexes. No cranial nerve deficit. Coordination normal.  Skin: Skin is warm and dry.  Psychiatric: She has a normal mood and affect.          Assessment & Plan:  Assessment:  1. Anxiety 2. Depression 3. Insomnia  Plan: Decrease Klonopin to 0.5 mg, 1 mg alternating. Continue current medications. Patient call the office with any questions or concerns. Recheck in 3 months and sooner as needed.

## 2012-08-03 ENCOUNTER — Ambulatory Visit (INDEPENDENT_AMBULATORY_CARE_PROVIDER_SITE_OTHER): Payer: BC Managed Care – PPO | Admitting: Family

## 2012-08-03 ENCOUNTER — Encounter: Payer: Self-pay | Admitting: Family

## 2012-08-03 VITALS — BP 108/70 | HR 90 | Wt 172.0 lb

## 2012-08-03 DIAGNOSIS — F329 Major depressive disorder, single episode, unspecified: Secondary | ICD-10-CM

## 2012-08-03 DIAGNOSIS — F3289 Other specified depressive episodes: Secondary | ICD-10-CM

## 2012-08-03 DIAGNOSIS — F32A Depression, unspecified: Secondary | ICD-10-CM

## 2012-08-03 DIAGNOSIS — G4700 Insomnia, unspecified: Secondary | ICD-10-CM

## 2012-08-03 DIAGNOSIS — F411 Generalized anxiety disorder: Secondary | ICD-10-CM

## 2012-08-03 MED ORDER — WELLBUTRIN XL 150 MG PO TB24: 150.0000 mg | ORAL_TABLET | Freq: Every day | ORAL | Status: DC

## 2012-08-03 MED ORDER — QUETIAPINE FUMARATE 50 MG PO TABS: 50.0000 mg | ORAL_TABLET | Freq: Every day | ORAL | Status: DC

## 2012-08-03 NOTE — Patient Instructions (Addendum)
Manic Depression (Bipolar Disorder)  Bipolar disorder is also known as manic depressive illness. It is when the brain does not function properly and causes shifts in a person's moods, energy and ability to function in everyday life. These shifts are different from the normal ups and downs that everyone experiences. Instead the shifts are severe. If this goes untreated, the person's life becomes more and more disorderly. People with this disorder can be treated can lead full and productive lives. This disorder must be managed throughout life.   SYMPTOMS    Bipolar disorder causes dramatic mood swings. These mood swings go in cycles. They cycle from extreme "highs" and irritable to deep "lows" of sadness and hopelessness.   Between the extreme moods, there are usually periods of normal mood.   Along with the mood shifts, the person will have severe changes in energy and behavior. The periods of "highs" and "lows" are called episodes of mania and depression.  Signs of mania:   Lots of energy, activity and restlessness.   Extreme "high" or good mood.   Extreme irritability.   Racing thoughts and talking very fast.   Jumping from one idea to another.   Not able to focus, easily distracted.   Little need to sleep.   Grand beliefs in one's abilities and powers.   Spending sprees.   Increased sexual drive. This can result in many sexual partners.   Poor judgment.   Abuse of drugs, particularly cocaine, alcohol, and sleeping medication.   Aggressive or provocative behavior.   A lasting period of behavior that is different from usual.   Denial that anything is wrong.  *A manic episode is identified if a "high" mood happens with three or more of the other symptoms lasting most of the day, nearly everyday for a week or longer. If the mood is more irritable in nature, four additional symptoms must be present.  Signs of depression:   Lasting feelings of sadness, anxiety, or empty mood.   Feelings of  hopelessness with negative thoughts.   Feelings of guilt, worthlessness, or helplessness.   Loss of interest or pleasure in activities once enjoyed, including sex.   Feelings of fatigue or having less energy.   Trouble focusing, making decisions, remembering.   Feeling restless or irritable.   Sleeping too little or too much.   Change in eating with possible weight gain or loss.   Feeling ongoing pain that is not caused by physical illness or injury.   Thoughts of death or suicide or suicide attempts.  *A depressive episode is identified as having five or more of the above symptoms that last most of the day, nearly everyday for two weeks or longer.  CAUSES    Research shows that there is no single cause for the disorder. Many factors act together to produce the illness.   This can be passed down from family (hereditary).   Environment may play a part.  TREATMENT    Long-term treatment is strongly recommended because bipolar disorder is a repeated illness. This disorder is better controlled if treatment is ongoing than if it is off and on.   A combination of medication and talk therapy is best for managing the disorder over time.   Medication.   Medication can be prescribed by a doctor that is an expert in treating mental disorders (psychiatrists). Medications known as "mood stabilizers" are usually prescribed to help control the illness. Other medications can be added when needed. These medicines usually treat episodes   of mania or depression that break through despite the mood stabilizer.   Talk Therapy.   Along with medication, some forms of talk therapy are helpful in providing support, education and guidance to people with the illness and their families. Studies show that this type of treatment increases mood stability, decreases need for hospitalization and improves how they function society.   Electroconvulsive Therapy (ECT).   In extreme situations where the above treatments do not work or  work too slowly to relieve severe symptoms, ECT may be considered.  Document Released: 07/21/2000 Document Revised: 07/07/2011 Document Reviewed: 03/12/2007  [employer] Patient Information 2013 [employer].

## 2012-08-03 NOTE — Progress Notes (Signed)
Subjective:    Patient ID: Dawn Scott, female    DOB: February 12, 1971, 42 y.o.   MRN: [id number]  HPI 42 year old white female, nonsmoker is in for recheck of depression and anxiety. Depression originally began 05/19/2013after the death of her husband certainly. Since that time, she shot areas medications and has worked for brief periods of time and then she will plateau off, the medication eventually not working for her any longer. She's currently on Wellbutrin XL 300 mg daily. Again, Celexa medication was working initially, however today feels like is no longer working. Has periods of filling up and down. Has been sleeping more. Reports being willing to try to go back to work but does not believe that she could do it on a full-time basis due to the stress level. However, her employer has expressed that she cannot return on a part-time basis. Therefore, she will require disability forms to be completed. Patient has a remote family history of bipolar "tendencies."   Review of Systems  Constitutional: Positive for activity change and fatigue.  HENT: Negative.   Eyes: Negative.   Respiratory: Negative.   Cardiovascular: Negative.   Gastrointestinal: Negative.   Musculoskeletal: Negative.   Skin: Negative.   Neurological: Negative.   Psychiatric/Behavioral: Positive for confusion, sleep disturbance and agitation. Negative for suicidal ideas. The patient is nervous/anxious and is hyperactive.    Past Medical History  Diagnosis Date  . Depression   . Anxiety     History   Social History  . Marital Status: Married    Spouse Name: N/A    Number of Children: N/A  . Years of Education: N/A   Occupational History  . Not on file.   Social History Main Topics  . Smoking status: Never Smoker   . Smokeless tobacco: Never Used  . Alcohol Use: No  . Drug Use: No  . Sexually Active: No   Other Topics Concern  . Not on file   Social History Narrative  . No narrative on file    Past  Surgical History  Procedure Laterality Date  . Appendectomy      Family History  Problem Relation Age of Onset  . Depression Mother   . Alcohol abuse Father   . Depression Father   . Alcohol abuse Maternal Grandfather     No Known Allergies  Current Outpatient Prescriptions on File Prior to Visit  Medication Sig Dispense Refill  . clonazePAM (KLONOPIN) 1 MG tablet Take 1 tablet (1 mg total) by mouth at bedtime.  30 tablet  3  . loratadine (CLARITIN) 10 MG tablet Take 10 mg by mouth daily as needed.        No current facility-administered medications on file prior to visit.    BP 108/70  Pulse 90  Wt 172 lb (78.019 kg)  BMI 25.04 kg/m2  SpO2 98%chart and in    Objective:   Physical Exam  Constitutional: She is oriented to person, place, and time. She appears well-developed and well-nourished.  Neck: Normal range of motion. Neck supple. No thyromegaly present.  Cardiovascular: Normal rate, regular rhythm and normal heart sounds.   Pulmonary/Chest: Effort normal and breath sounds normal.  Musculoskeletal: Normal range of motion.  Neurological: She is alert and oriented to person, place, and time.  Skin: Skin is warm and dry.  Psychiatric: She has a normal mood and affect.          Assessment & Plan:  Assessment:  1. Depression 2. Anxiety 3.  Insomnia  Plan: We will try a trial of Seroquel 50 mg at bedtime to help with insomnia and maybe help as a mood stabilizer. She would decrease Wellbutrin 250 mg daily. We'll bring patient back for recheck in 2 weeks. At that time, we will consider increase in Seroquel 100 mg and discontinuing Wellbutrin if she still doing well. Continue seeing psychotherapy. Forms completed. Discussed referring her to psychiatry for further management. However, patient is reluctant. I have advised that if she does not respond well to Seroquel, then we will have to seek psychiatry for medication management.

## 2012-08-17 ENCOUNTER — Ambulatory Visit (INDEPENDENT_AMBULATORY_CARE_PROVIDER_SITE_OTHER): Payer: BC Managed Care – PPO | Admitting: Family

## 2012-08-17 ENCOUNTER — Encounter: Payer: Self-pay | Admitting: Family

## 2012-08-17 VITALS — BP 100/60 | HR 82 | Wt 167.0 lb

## 2012-08-17 DIAGNOSIS — F411 Generalized anxiety disorder: Secondary | ICD-10-CM

## 2012-08-17 DIAGNOSIS — F321 Major depressive disorder, single episode, moderate: Secondary | ICD-10-CM

## 2012-08-17 NOTE — Progress Notes (Signed)
Subjective:    Patient ID: Dawn Scott, female    DOB: July 05, 1970, 42 y.o.   MRN: [id number]  HPI 42 year old WF, nonsmoker, is in today for a recheck of depression and anxiety. The last office visit she was instructed to continue Wellbutrin 300 mg in start Seroquel 50 mg once daily. Patient continues to have decreased energy, decreased motivation and concentration. She feels that things these are worse now. She has difficulty completing tasks like cooking, cleaning. Has to lose of helplessness and hopelessness. She's down 5 pounds from her last office visit. She continues to see psychotherapy twice a week. She has been on multiple medications to include Lexapro, Prozac, Klonopin, Xanax, Wellbutrin, Seroquel amongst others. At her last office visit I advised her that she may need to see psychiatry.   Review of Systems  Constitutional: Positive for fatigue and unexpected weight change.  Respiratory: Negative.   Cardiovascular: Negative.   Gastrointestinal: Negative.   Endocrine: Negative.   Musculoskeletal: Negative.   Skin: Negative.   Neurological: Negative.   Hematological: Negative.   Psychiatric/Behavioral: Positive for confusion and sleep disturbance. Negative for suicidal ideas. The patient is nervous/anxious.    Past Medical History  Diagnosis Date  . Depression   . Anxiety     History   Social History  . Marital Status: Married    Spouse Name: N/A    Number of Children: N/A  . Years of Education: N/A   Occupational History  . Not on file.   Social History Main Topics  . Smoking status: Never Smoker   . Smokeless tobacco: Never Used  . Alcohol Use: No  . Drug Use: No  . Sexually Active: No   Other Topics Concern  . Not on file   Social History Narrative  . No narrative on file    Past Surgical History  Procedure Laterality Date  . Appendectomy      Family History  Problem Relation Age of Onset  . Depression Mother   . Alcohol abuse Father   .  Depression Father   . Alcohol abuse Maternal Grandfather     No Known Allergies  Current Outpatient Prescriptions on File Prior to Visit  Medication Sig Dispense Refill  . clonazePAM (KLONOPIN) 1 MG tablet Take 1 tablet (1 mg total) by mouth at bedtime.  30 tablet  3  . loratadine (CLARITIN) 10 MG tablet Take 10 mg by mouth daily as needed.       . WELLBUTRIN XL 150 MG 24 hr tablet Take 1 tablet (150 mg total) by mouth daily.  30 tablet  3   No current facility-administered medications on file prior to visit.    BP 100/60  Pulse 82  Wt 167 lb (75.751 kg)  BMI 24.32 kg/m2  SpO2 98%chart    Objective:   Physical Exam  Constitutional: She is oriented to person, place, and time. She appears well-developed and well-nourished.  Neck: Normal range of motion. Neck supple.  Cardiovascular: Normal rate, regular rhythm and normal heart sounds.   Pulmonary/Chest: Effort normal and breath sounds normal.  Abdominal: Soft. Bowel sounds are normal.  Musculoskeletal: Normal range of motion.  Neurological: She is alert and oriented to person, place, and time.  Skin: Skin is warm and dry.  Psychiatric:  Flat affect          Assessment & Plan:  Assessment: 1. Depression-uncontrolled 2. Anxiety  Plan: Advised patient to see psychiatry for management of her chronic depression and anxiety.  She does not appear to be doing any better. Is also explained that I'm unable to take her out of work for one year. She will see psychiatry and they wouldn't make the decision on whether or not she is permanently disabled. Recheck here for any other medical conditions. Continue psychotherapy.

## 2012-08-19 ENCOUNTER — Encounter: Payer: Self-pay | Admitting: Family

## 2012-08-20 ENCOUNTER — Other Ambulatory Visit: Payer: Self-pay

## 2012-08-20 MED ORDER — WELLBUTRIN XL 150 MG PO TB24: 150.0000 mg | ORAL_TABLET | Freq: Every day | ORAL | Status: DC

## 2012-08-20 NOTE — Progress Notes (Signed)
Error

## 2012-08-23 ENCOUNTER — Other Ambulatory Visit: Payer: Self-pay

## 2012-08-23 MED ORDER — WELLBUTRIN XL 150 MG PO TB24: 150.0000 mg | ORAL_TABLET | Freq: Every day | ORAL | Status: DC

## 2012-10-04 ENCOUNTER — Other Ambulatory Visit (HOSPITAL_COMMUNITY)
Admission: RE | Admit: 2012-10-04 | Discharge: 2012-10-04 | Disposition: A | Discharge status: Home / Self Care | Payer: BC Managed Care – PPO | Source: Ambulatory Visit | Attending: Emergency Medicine | Admitting: Emergency Medicine

## 2012-10-04 ENCOUNTER — Encounter: Payer: Self-pay | Admitting: Family

## 2012-10-04 ENCOUNTER — Ambulatory Visit (INDEPENDENT_AMBULATORY_CARE_PROVIDER_SITE_OTHER): Payer: BC Managed Care – PPO | Admitting: Family

## 2012-10-04 VITALS — BP 112/80 | HR 71 | Wt 168.0 lb

## 2012-10-04 DIAGNOSIS — N760 Acute vaginitis: Secondary | ICD-10-CM

## 2012-10-04 DIAGNOSIS — N898 Other specified noninflammatory disorders of vagina: Secondary | ICD-10-CM

## 2012-10-04 MED ORDER — METRONIDAZOLE 0.75 % VA GEL: 1.0000 | Freq: Every day | VAGINAL | Status: DC

## 2012-10-04 NOTE — Progress Notes (Signed)
  Subjective:    Patient ID: Dawn Scott, female    DOB: 1970/10/01, 42 y.o.   MRN: [id number]  HPI 42 year old white female, nonsmoker is in today with complaints of vaginal discharge and odor it is an ongoing x2 weeks. She reports the discharge the musty. Describes it is somewhat itchy and whitish yellow in coloration. Hasn't taken over-the-counter medications with no relief. She is not sexually active. Has an IUD in place.   Review of Systems  Constitutional: Negative.   Respiratory: Negative.   Cardiovascular: Negative.   Genitourinary: Positive for vaginal discharge. Negative for vaginal bleeding and vaginal pain.  Musculoskeletal: Negative.   Skin: Negative.   Psychiatric/Behavioral: Negative.    Past Medical History  Diagnosis Date  . Depression   . Anxiety     History   Social History  . Marital Status: Married    Spouse Name: N/A    Number of Children: N/A  . Years of Education: N/A   Occupational History  . Not on file.   Social History Main Topics  . Smoking status: Never Smoker   . Smokeless tobacco: Never Used  . Alcohol Use: No  . Drug Use: No  . Sexually Active: No   Other Topics Concern  . Not on file   Social History Narrative  . No narrative on file    Past Surgical History  Procedure Laterality Date  . Appendectomy      Family History  Problem Relation Age of Onset  . Depression Mother   . Alcohol abuse Father   . Depression Father   . Alcohol abuse Maternal Grandfather     No Known Allergies  Current Outpatient Prescriptions on File Prior to Visit  Medication Sig Dispense Refill  . clonazePAM (KLONOPIN) 1 MG tablet Take 1 tablet (1 mg total) by mouth at bedtime.  30 tablet  3  . loratadine (CLARITIN) 10 MG tablet Take 10 mg by mouth daily as needed.        No current facility-administered medications on file prior to visit.    BP 112/80  Pulse 71  Wt 168 lb (76.204 kg)  BMI 24.46 kg/m2  SpO2 99%chart    Objective:   Physical Exam  Constitutional: She is oriented to person, place, and time. She appears well-developed and well-nourished.  HENT:  Right Ear: External ear normal.  Nose: Nose normal.  Mouth/Throat: Oropharynx is clear and moist.  Neck: Normal range of motion. Neck supple.  Cardiovascular: Normal rate, regular rhythm and normal heart sounds.   Pulmonary/Chest: Effort normal and breath sounds normal.  Abdominal: Soft. Bowel sounds are normal. She exhibits no distension. There is no tenderness. There is no rebound.  Genitourinary: Vaginal discharge found.  Frothy, musty vaginal discharge  Neurological: She is alert and oriented to person, place, and time.  Skin: Skin is warm and dry.  Psychiatric: She has a normal mood and affect.          Assessment & Plan:  Assessment:  1. Vaginitis 2. Vaginal discharge  Plan: MetroGel one applicator at bedtime x5 nights. Warned against alcohol use. Patient call the office if symptoms worsen or persist. Recheck a schedule, and as needed. Affirm test sent to the lab.

## 2012-10-04 NOTE — Patient Instructions (Addendum)
Bacterial Vaginosis Bacterial vaginosis (BV) is a vaginal infection where the normal balance of bacteria in the vagina is disrupted. The normal balance is then replaced by an overgrowth of certain bacteria. There are several different kinds of bacteria that can cause BV. BV is the most common vaginal infection in women of childbearing age. CAUSES   The cause of BV is not fully understood. BV develops when there is an increase or imbalance of harmful bacteria.  Some activities or behaviors can upset the normal balance of bacteria in the vagina and put women at increased risk including:  Having a new sex partner or multiple sex partners.  Douching.  Using an intrauterine device (IUD) for contraception.  It is not clear what role sexual activity plays in the development of BV. However, women that have never had sexual intercourse are rarely infected with BV. Women do not get BV from toilet seats, bedding, swimming pools or from touching objects around them.  SYMPTOMS   Grey vaginal discharge.  A fish-like odor with discharge, especially after sexual intercourse.  Itching or burning of the vagina and vulva.  Burning or pain with urination.  Some women have no signs or symptoms at all. DIAGNOSIS  Your caregiver must examine the vagina for signs of BV. Your caregiver will perform lab tests and look at the sample of vaginal fluid through a microscope. They will look for bacteria and abnormal cells (clue cells), a pH test higher than 4.5, and a positive amine test all associated with BV.  RISKS AND COMPLICATIONS   Pelvic inflammatory disease (PID).  Infections following gynecology surgery.  Developing HIV.  Developing herpes virus. TREATMENT  Sometimes BV will clear up without treatment. However, all women with symptoms of BV should be treated to avoid complications, especially if gynecology surgery is planned. Female partners generally do not need to be treated. However, BV may spread  between female sex partners so treatment is helpful in preventing a recurrence of BV.   BV may be treated with antibiotics. The antibiotics come in either pill or vaginal cream forms. Either can be used with nonpregnant or pregnant women, but the recommended dosages differ. These antibiotics are not harmful to the baby.  BV can recur after treatment. If this happens, a second round of antibiotics will often be prescribed.  Treatment is important for pregnant women. If not treated, BV can cause a premature delivery, especially for a pregnant woman who had a premature birth in the past. All pregnant women who have symptoms of BV should be checked and treated.  For chronic reoccurrence of BV, treatment with a type of prescribed gel vaginally twice a week is helpful. HOME CARE INSTRUCTIONS   Finish all medication as directed by your caregiver.  Do not have sex until treatment is completed.  Tell your sexual partner that you have a vaginal infection. They should see their caregiver and be treated if they have problems, such as a mild rash or itching.  Practice safe sex. Use condoms. Only have 1 sex partner. PREVENTION  Basic prevention steps can help reduce the risk of upsetting the natural balance of bacteria in the vagina and developing BV:  Do not have sexual intercourse (be abstinent).  Do not douche.  Use all of the medicine prescribed for treatment of BV, even if the signs and symptoms go away.  Tell your sex partner if you have BV. That way, they can be treated, if needed, to prevent reoccurrence. SEEK MEDICAL CARE IF:     Your symptoms are not improving after 3 days of treatment.  You have increased discharge, pain, or fever. MAKE SURE YOU:   Understand these instructions.  Will watch your condition.  Will get help right away if you are not doing well or get worse. FOR MORE INFORMATION  Division of STD Prevention (DSTDP), Centers for Disease Control and Prevention:  www.cdc.gov/std [employer] (ASHA): www.ashastd.org  Document Released: 04/14/2005 Document Revised: 07/07/2011 Document Reviewed: 10/05/2008 [employer] Patient Information 2014 [employer].  

## 2012-10-06 ENCOUNTER — Ambulatory Visit: Payer: Self-pay | Admitting: Family

## 2012-10-08 ENCOUNTER — Encounter: Payer: Self-pay | Admitting: Family

## 2013-02-04 ENCOUNTER — Ambulatory Visit (INDEPENDENT_AMBULATORY_CARE_PROVIDER_SITE_OTHER): Payer: BC Managed Care – PPO

## 2013-02-04 DIAGNOSIS — Z23 Encounter for immunization: Secondary | ICD-10-CM

## 2013-03-28 ENCOUNTER — Encounter (HOSPITAL_COMMUNITY): Payer: Self-pay | Admitting: Emergency Medicine

## 2013-03-28 ENCOUNTER — Emergency Department (HOSPITAL_COMMUNITY): Payer: BC Managed Care – PPO

## 2013-03-28 ENCOUNTER — Emergency Department (HOSPITAL_COMMUNITY)
Admission: EM | Admit: 2013-03-28 | Discharge: 2013-03-28 | Disposition: A | Discharge status: Home / Self Care | Payer: BC Managed Care – PPO | Attending: Emergency Medicine | Admitting: Emergency Medicine

## 2013-03-28 DIAGNOSIS — F329 Major depressive disorder, single episode, unspecified: Secondary | ICD-10-CM | POA: Insufficient documentation

## 2013-03-28 DIAGNOSIS — R000 Tachycardia, unspecified: Secondary | ICD-10-CM | POA: Insufficient documentation

## 2013-03-28 DIAGNOSIS — M549 Dorsalgia, unspecified: Secondary | ICD-10-CM | POA: Insufficient documentation

## 2013-03-28 DIAGNOSIS — F411 Generalized anxiety disorder: Secondary | ICD-10-CM | POA: Insufficient documentation

## 2013-03-28 DIAGNOSIS — Z3202 Encounter for pregnancy test, result negative: Secondary | ICD-10-CM | POA: Insufficient documentation

## 2013-03-28 DIAGNOSIS — F3289 Other specified depressive episodes: Secondary | ICD-10-CM | POA: Insufficient documentation

## 2013-03-28 DIAGNOSIS — M940 Chondrocostal junction syndrome [Tietze]: Secondary | ICD-10-CM

## 2013-03-28 DIAGNOSIS — Z79899 Other long term (current) drug therapy: Secondary | ICD-10-CM | POA: Insufficient documentation

## 2013-03-28 DIAGNOSIS — R0789 Other chest pain: Secondary | ICD-10-CM

## 2013-03-28 DIAGNOSIS — M25519 Pain in unspecified shoulder: Secondary | ICD-10-CM | POA: Insufficient documentation

## 2013-03-28 LAB — POCT I-STAT TROPONIN I: Troponin i, poc: 0 ng/mL (ref 0.00–0.08)

## 2013-03-28 LAB — D-DIMER, QUANTITATIVE (NOT AT ARMC): D-Dimer, Quant: <0.27 ug/mL-FEU (ref 0.00–0.48)

## 2013-03-28 LAB — CBC
HCT: 38.7 % (ref 36.0–46.0)
Hemoglobin: 13.1 g/dL (ref 12.0–15.0)
MCH: 29.2 pg (ref 26.0–34.0)
MCV: 86.4 fL (ref 78.0–100.0)
RBC: 4.48 MIL/uL (ref 3.87–5.11)
RDW: 12.9 % (ref 11.5–15.5)
WBC: 7.3 10*3/uL (ref 4.0–10.5)

## 2013-03-28 LAB — COMPREHENSIVE METABOLIC PANEL
Alkaline Phosphatase: 44 U/L (ref 39–117)
BUN: 12 mg/dL (ref 6–23)
CO2: 22 mEq/L (ref 19–32)
Chloride: 94 mEq/L — ABNORMAL LOW (ref 96–112)
Creatinine, Ser: 0.74 mg/dL (ref 0.50–1.10)
GFR calc Af Amer: >90 mL/min (ref >90)
GFR calc non Af Amer: >90 mL/min (ref >90)
Potassium: 3.5 mEq/L (ref 3.5–5.1)
Total Bilirubin: 0.2 mg/dL — ABNORMAL LOW (ref 0.3–1.2)

## 2013-03-28 LAB — URINALYSIS, ROUTINE W REFLEX MICROSCOPIC
Bilirubin Urine: NEGATIVE
Glucose, UA: NEGATIVE mg/dL
Hgb urine dipstick: NEGATIVE
Ketones, ur: NEGATIVE mg/dL
Nitrite: NEGATIVE
Protein, ur: NEGATIVE mg/dL
Specific Gravity, Urine: 1.005 (ref 1.005–1.030)
Urobilinogen, UA: 0.2 mg/dL (ref 0.0–1.0)
pH: 6 (ref 5.0–8.0)

## 2013-03-28 LAB — LIPASE, BLOOD: Lipase: 34 U/L (ref 11–59)

## 2013-03-28 MED ORDER — HYDROCODONE-ACETAMINOPHEN 5-325 MG PO TABS: 1.0000 | ORAL_TABLET | ORAL | Status: DC | PRN

## 2013-03-28 MED ORDER — SODIUM CHLORIDE 0.9 % IV BOLUS (SEPSIS)
1000.0000 mL | Freq: Once | INTRAVENOUS | Status: AC
  Administered 2013-03-28: 1000 mL via INTRAVENOUS

## 2013-03-28 MED ORDER — MELOXICAM 15 MG PO TABS: 15.0000 mg | ORAL_TABLET | Freq: Every day | ORAL | Status: DC

## 2013-03-28 MED ORDER — KETOROLAC TROMETHAMINE 30 MG/ML IJ SOLN
30.0000 mg | Freq: Once | INTRAMUSCULAR | Status: AC
  Administered 2013-03-28: 30 mg via INTRAVENOUS
  Filled 2013-03-28: qty 1

## 2013-03-28 NOTE — ED Provider Notes (Signed)
CSN: [id number]     Arrival date & time 03/28/13  1318 History   First MD Initiated Contact with Patient 03/28/13 1508     Chief Complaint  Patient presents with  . Chest Pain  . Shoulder Pain   (Consider location/radiation/quality/duration/timing/severity/associated sxs/prior Treatment) Patient is a 42 y.o. female presenting with chest pain and shoulder pain. The history is provided by the patient.  Chest Pain Pain location:  L chest Pain quality: sharp   Pain radiates to:  Does not radiate Pain radiates to the back: no   Pain severity:  Severe Duration:  1 day Timing:  Sporadic Progression:  Waxing and waning Chronicity:  New Context: not breathing, no drug use, not eating, no intercourse, not lifting, no movement, not raising an arm, not at rest, no stress and no trauma   Context comment:  Standing Relieved by:  Nothing Ineffective treatments:  Aspirin and rest Associated symptoms: anxiety and back pain   Associated symptoms: no abdominal pain, no anorexia, no cough, no diaphoresis, no dysphagia, no fatigue, no fever, no headache, no lower extremity edema, no near-syncope, no orthopnea, no palpitations, no shortness of breath, not vomiting and no weakness   Risk factors: no aortic disease, no diabetes mellitus, no hypertension, no immobilization, not female, not obese, no prior DVT/PE, no smoking and no surgery   Shoulder Pain Associated symptoms include chest pain. Pertinent negatives include no abdominal pain, anorexia, coughing, diaphoresis, fatigue, fever, headaches, vomiting or weakness.    Dawn Scott is a 42 y.o.female with a significant PMH of anxiety and depression after her husband past away in an accident 18 months ago presents to the ER with complaints of left shoulder pain and chest pain. The shoulder pain started 4 days ago and has not changed. This morning while shopping she developed intermittent sharp  Pains under her left breast that she describes as excruciating  and only last seconds. She tried to see her PCP at [facility] and they were unable to accommodate her because they do not have an xray machine and recommend she come to the ER for get evaluated. She is not on any hormone therapies. But is tachycardic in triage. Says she has been eating and drinking normal. No hx or family hx of cardiac dz. Pt says that she has been moving heavy things in the front yard and just took up golf lessons.  Normal 3D mammogram 3 weeks ago. No breast discharge, mass or rash.   Past Medical History  Diagnosis Date  . Depression   . Anxiety    Past Surgical History  Procedure Laterality Date  . Appendectomy    . Ectopic pregnancy surgery    . Mole removal     Family History  Problem Relation Age of Onset  . Depression Mother   . Alcohol abuse Father   . Depression Father   . Alcohol abuse Maternal Grandfather    History  Substance Use Topics  . Smoking status: Never Smoker   . Smokeless tobacco: Never Used  . Alcohol Use: No   OB History   Grav Para Term Preterm Abortions TAB SAB Ect Mult Living                 Review of Systems  Constitutional: Negative for fever, diaphoresis and fatigue.  HENT: Negative for trouble swallowing.   Respiratory: Negative for cough and shortness of breath.   Cardiovascular: Positive for chest pain. Negative for palpitations, orthopnea and near-syncope.  Gastrointestinal: Negative for  vomiting, abdominal pain and anorexia.  Musculoskeletal: Positive for back pain.  Neurological: Negative for weakness and headaches.    Allergies  Review of patient's allergies indicates no known allergies.  Home Medications   Current Outpatient Rx  Name  Route  Sig  Dispense  Refill  . buPROPion (WELLBUTRIN XL) 150 MG 24 hr tablet   Oral   Take 300 mg by mouth daily.         . clonazePAM (KLONOPIN) 0.5 MG tablet   Oral   Take 0.5 mg by mouth 2 (two) times daily as needed for anxiety.         . DULoxetine (CYMBALTA) 30 MG  capsule   Oral   Take 60 mg by mouth daily.          Marland Kitchen loratadine (CLARITIN) 10 MG tablet   Oral   Take 10 mg by mouth daily as needed.          . metroNIDAZOLE (METROGEL VAGINAL) 0.75 % vaginal gel   Vaginal   Place 1 Applicatorful vaginally at bedtime.   70 g   0   . Multiple Vitamins-Minerals (MULTIVITAMIN WITH MINERALS) tablet   Oral   Take 1 tablet by mouth daily.         . traZODone (DESYREL) 50 MG tablet   Oral   Take 50 mg by mouth at bedtime.          Marland Kitchen HYDROcodone-acetaminophen (NORCO/VICODIN) 5-325 MG per tablet   Oral   Take 1-2 tablets by mouth every 4 (four) hours as needed.   15 tablet   0   . meloxicam (MOBIC) 15 MG tablet   Oral   Take 1 tablet (15 mg total) by mouth daily.   30 tablet   0    BP 140/77  Pulse 111  Temp(Src) 98.8 F (37.1 C) (Oral)  Resp 16  SpO2 98%  LMP 03/10/2013 Physical Exam  Nursing note and vitals reviewed. Constitutional: She appears well-developed and well-nourished. No distress.  HENT:  Head: Normocephalic and atraumatic.  Eyes: Pupils are equal, round, and reactive to light.  Neck: Normal range of motion. Neck supple.  Cardiovascular: Regular rhythm.  Tachycardia present.   Pulmonary/Chest: Effort normal.    Abdominal: Soft.  Neurological: She is alert.  Skin: Skin is warm and dry.    ED Course  Procedures (including critical care time) Labs Review Labs Reviewed  COMPREHENSIVE METABOLIC PANEL - Abnormal; Notable for the following:    Sodium 130 (*)    Chloride 94 (*)    Total Bilirubin 0.2 (*)    All other components within normal limits  CBC  URINALYSIS, ROUTINE W REFLEX MICROSCOPIC  PREGNANCY, URINE  D-DIMER, QUANTITATIVE  LIPASE, BLOOD  TROPONIN I  POCT I-STAT TROPONIN I   Imaging Review Dg Chest 2 View  03/28/2013   CLINICAL DATA:  Chest pain  EXAM: CHEST  2 VIEW  COMPARISON:  None.  FINDINGS: The lungs are mildly hyperexpanded but clear. Heart size and pulmonary vascularity are  normal. No adenopathy. No pneumothorax. No bone lesions.  IMPRESSION: No edema or consolidation.  Lungs mildly hyperexpanded.   Electronically Signed   By: Bretta Bang M.D.   On: 03/28/2013 16:24    EKG Interpretation   None       MDM   1. Chest wall pain   2. Costochondritis      HEATHERLY, STENNER AV:[id number] 28-Mar-2013 78:29:56 [facility] ED ROUTINE RECORD Sinus tachycardia Borderline prolonged  PR interval Probable left atrial enlargement Left ventricular hypertrophy 8mm/s 68mm/mV 150Hz  8.0.1 CID: [id number] Referred by: Unconfirmed Vent. rate 104 BPM PR interval 204 ms QRS duration 99 ms QT/QTc 327/430 ms P-R-T axes 67 74 61  Patient had thorough owrk-up done in the ER. Symptoms appear to be very muscular. Will have her follow-up with her PCP and treat with analgesics.  42 y.o.Dawn Scott's evaluation in the Emergency Department is complete. It has been determined that no acute conditions requiring further emergency intervention are present at this time. The patient/guardian have been advised of the diagnosis and plan. We have discussed signs and symptoms that warrant return to the ED, such as changes or worsening in symptoms.  Vital signs are stable at discharge. Filed Vitals:   03/28/13 1330  BP: 140/77  Pulse: 111  Temp: 98.8 F (37.1 C)  Resp: 16    Patient/guardian has voiced understanding and agreed to follow-up with the PCP or specialist.    Dorthula Matas, PA-C 03/28/13 1823

## 2013-03-28 NOTE — ED Notes (Signed)
Patient reports that she had left shoulder pain 4 days ago and left lower chest pain last night. Patient denies any SOB, diaphoresis, dizziness, or weakness.

## 2013-03-29 NOTE — ED Provider Notes (Signed)
Medical screening examination/treatment/procedure(s) were performed by non-physician practitioner and as supervising physician I was immediately available for consultation/collaboration.  EKG Interpretation   None        Lai Hendriks F Sedonia Kitner, MD 03/29/13 1635 

## 2013-03-30 ENCOUNTER — Ambulatory Visit (INDEPENDENT_AMBULATORY_CARE_PROVIDER_SITE_OTHER): Payer: BC Managed Care – PPO | Admitting: Family

## 2013-03-30 ENCOUNTER — Encounter: Payer: Self-pay | Admitting: Family

## 2013-03-30 VITALS — BP 120/80 | HR 91 | Wt 176.0 lb

## 2013-03-30 DIAGNOSIS — R0789 Other chest pain: Secondary | ICD-10-CM

## 2013-03-30 DIAGNOSIS — R071 Chest pain on breathing: Secondary | ICD-10-CM

## 2013-03-30 DIAGNOSIS — M940 Chondrocostal junction syndrome [Tietze]: Secondary | ICD-10-CM

## 2013-03-30 MED ORDER — PREDNISONE 20 MG PO TABS: 60.0000 mg | ORAL_TABLET | Freq: Every day | ORAL | Status: DC

## 2013-03-30 NOTE — Patient Instructions (Signed)
Costochondritis Costochondritis (Tietze syndrome), or costochondral separation, is a swelling and irritation (inflammation) of the tissue (cartilage) that connects your ribs with your breastbone (sternum). It may occur on its own (spontaneously), through damage caused by an accident (trauma), or simply from coughing or minor exercise. It may take up to 6 weeks to get better and longer if you are unable to be conservative in your activities. HOME CARE INSTRUCTIONS   Avoid exhausting physical activity. Try not to strain your ribs during normal activity. This would include any activities using chest, belly (abdominal), and side muscles, especially if heavy weights are used.  Use ice for 15-20 minutes per hour while awake for the first 2 days. Place the ice in a plastic bag, and place a towel between the bag of ice and your skin.  Only take over-the-counter or prescription medicines for pain, discomfort, or fever as directed by your caregiver. SEEK IMMEDIATE MEDICAL CARE IF:   Your pain increases or you are very uncomfortable.  You have a fever.  You develop difficulty with your breathing.  You cough up blood.  You develop worse chest pains, shortness of breath, sweating, or vomiting.  You develop new, unexplained problems (symptoms). MAKE SURE YOU:   Understand these instructions.  Will watch your condition.  Will get help right away if you are not doing well or get worse. Document Released: 01/22/2005 Document Revised: 07/07/2011 Document Reviewed: 11/16/2012 [employer] Patient Information 2014 [employer].  

## 2013-04-01 ENCOUNTER — Encounter: Payer: Self-pay | Admitting: Family

## 2013-04-01 NOTE — Progress Notes (Signed)
Subjective:    Patient ID: Dawn Scott, female    DOB: 1971/03/26, 42 y.o.   MRN: [id number]  HPI 42 year old WF, nonsmoker, is in today as a ER follow-up for Costochondritis. She continues to have left chest wall pain to palpation and with movement. She was prescribed an NSAID and hydrocodone for pain whiteout relief. Rates pain 6/10. Pain is constant.    Review of Systems  Constitutional: Negative.   Respiratory: Negative.   Cardiovascular: Negative.   Endocrine: Negative.   Musculoskeletal: Negative.        Left chest wall pain  Skin: Negative.   Neurological: Negative.   Hematological: Negative.   Psychiatric/Behavioral: Negative.    Past Medical History  Diagnosis Date  . Depression   . Anxiety     History   Social History  . Marital Status: Widowed    Spouse Name: N/A    Number of Children: N/A  . Years of Education: N/A   Occupational History  . Not on file.   Social History Main Topics  . Smoking status: Never Smoker   . Smokeless tobacco: Never Used  . Alcohol Use: No  . Drug Use: No  . Sexual Activity: No   Other Topics Concern  . Not on file   Social History Narrative  . No narrative on file    Past Surgical History  Procedure Laterality Date  . Appendectomy    . Ectopic pregnancy surgery    . Mole removal      Family History  Problem Relation Age of Onset  . Depression Mother   . Alcohol abuse Father   . Depression Father   . Alcohol abuse Maternal Grandfather     No Known Allergies  Current Outpatient Prescriptions on File Prior to Visit  Medication Sig Dispense Refill  . buPROPion (WELLBUTRIN XL) 150 MG 24 hr tablet Take 300 mg by mouth daily.      . clonazePAM (KLONOPIN) 0.5 MG tablet Take 0.5 mg by mouth 2 (two) times daily as needed for anxiety.      . DULoxetine (CYMBALTA) 30 MG capsule Take 60 mg by mouth daily.       Marland Kitchen HYDROcodone-acetaminophen (NORCO/VICODIN) 5-325 MG per tablet Take 1-2 tablets by mouth every 4 (four)  hours as needed.  15 tablet  0  . loratadine (CLARITIN) 10 MG tablet Take 10 mg by mouth daily as needed.       . meloxicam (MOBIC) 15 MG tablet Take 1 tablet (15 mg total) by mouth daily.  30 tablet  0  . metroNIDAZOLE (METROGEL VAGINAL) 0.75 % vaginal gel Place 1 Applicatorful vaginally at bedtime.  70 g  0  . Multiple Vitamins-Minerals (MULTIVITAMIN WITH MINERALS) tablet Take 1 tablet by mouth daily.      . traZODone (DESYREL) 50 MG tablet Take 50 mg by mouth at bedtime.        No current facility-administered medications on file prior to visit.    BP 120/80  Pulse 91  Wt 176 lb (79.833 kg)  LMP 11/13/2014chart    Objective:   Physical Exam  Constitutional: She is oriented to person, place, and time. She appears well-developed and well-nourished.  Neck: Normal range of motion. Neck supple.  Cardiovascular: Normal rate, regular rhythm and normal heart sounds.   Pulmonary/Chest: Effort normal and breath sounds normal.  Musculoskeletal: She exhibits tenderness.  Left chest wall pain to palpation of the left chest at the 6th intercostal space extending laterally.  Neurological: She is alert and oriented to person, place, and time.  Skin: Skin is warm and dry.  Psychiatric: She has a normal mood and affect.          Assessment & Plan:  Assessment: 1. Costochondritis 2. Left chest wall pain  Plan: Medrol dosepak as directed. Call the office if symptoms worsen or persist. Recheck as scheduled and as needed.

## 2013-10-27 ENCOUNTER — Other Ambulatory Visit (INDEPENDENT_AMBULATORY_CARE_PROVIDER_SITE_OTHER): Payer: BC Managed Care – PPO

## 2013-10-27 ENCOUNTER — Encounter: Payer: BC Managed Care – PPO | Admitting: Family

## 2013-10-27 DIAGNOSIS — Z0000 Encounter for general adult medical examination without abnormal findings: Secondary | ICD-10-CM

## 2013-10-27 LAB — POCT URINALYSIS DIPSTICK
Bilirubin, UA: NEGATIVE
GLUCOSE UA: NEGATIVE
KETONES UA: NEGATIVE
Leukocytes, UA: NEGATIVE
Nitrite, UA: NEGATIVE
PH UA: 8.5
Protein, UA: NEGATIVE
RBC UA: NEGATIVE
Spec Grav, UA: 1.01
Urobilinogen, UA: 0.2

## 2013-10-27 LAB — CBC WITH DIFFERENTIAL/PLATELET
Basophils Absolute: 0 10*3/uL (ref 0.0–0.1)
Basophils Relative: 1.1 % (ref 0.0–3.0)
Eosinophils Absolute: 0.1 10*3/uL (ref 0.0–0.7)
Eosinophils Relative: 2 % (ref 0.0–5.0)
HCT: 38.7 % (ref 36.0–46.0)
HEMOGLOBIN: 13.1 g/dL (ref 12.0–15.0)
LYMPHS PCT: 35.5 % (ref 12.0–46.0)
Lymphs Abs: 1.5 10*3/uL (ref 0.7–4.0)
MCHC: 33.9 g/dL (ref 30.0–36.0)
MCV: 87 fl (ref 78.0–100.0)
MONOS PCT: 8.4 % (ref 3.0–12.0)
Monocytes Absolute: 0.4 10*3/uL (ref 0.1–1.0)
NEUTROS ABS: 2.3 10*3/uL (ref 1.4–7.7)
NEUTROS PCT: 53 % (ref 43.0–77.0)
Platelets: 233 10*3/uL (ref 150.0–400.0)
RBC: 4.45 Mil/uL (ref 3.87–5.11)
RDW: 12.6 % (ref 11.5–15.5)
WBC: 4.3 10*3/uL (ref 4.0–10.5)

## 2013-10-27 LAB — BASIC METABOLIC PANEL
BUN: 7 mg/dL (ref 6–23)
CALCIUM: 8.9 mg/dL (ref 8.4–10.5)
CO2: 28 meq/L (ref 19–32)
CREATININE: 0.7 mg/dL (ref 0.4–1.2)
Chloride: 102 mEq/L (ref 96–112)
GFR: 99.26 mL/min (ref >60.00)
GLUCOSE: 86 mg/dL (ref 70–99)
Potassium: 3.7 mEq/L (ref 3.5–5.1)
Sodium: 135 mEq/L (ref 135–145)

## 2013-10-27 LAB — LIPID PANEL
Cholesterol: 202 mg/dL — ABNORMAL HIGH (ref 0–200)
HDL: 54.6 mg/dL (ref >39.00)
LDL Cholesterol: 118 mg/dL — ABNORMAL HIGH (ref 0–99)
NonHDL: 147.4
Total CHOL/HDL Ratio: 4
Triglycerides: 146 mg/dL (ref 0.0–149.0)
VLDL: 29.2 mg/dL (ref 0.0–40.0)

## 2013-10-27 LAB — HEPATIC FUNCTION PANEL
ALT: 6 U/L (ref 0–35)
AST: 20 U/L (ref 0–37)
Albumin: 4 g/dL (ref 3.5–5.2)
Alkaline Phosphatase: 44 U/L (ref 39–117)
BILIRUBIN DIRECT: 0 mg/dL (ref 0.0–0.3)
TOTAL PROTEIN: 7.1 g/dL (ref 6.0–8.3)
Total Bilirubin: 0.4 mg/dL (ref 0.2–1.2)

## 2013-10-27 LAB — TSH: TSH: 1.74 u[IU]/mL (ref 0.35–4.50)

## 2013-11-18 ENCOUNTER — Encounter: Payer: Self-pay | Admitting: Physician Assistant

## 2013-11-18 ENCOUNTER — Ambulatory Visit (INDEPENDENT_AMBULATORY_CARE_PROVIDER_SITE_OTHER): Payer: BC Managed Care – PPO | Admitting: Physician Assistant

## 2013-11-18 VITALS — BP 120/82 | HR 72 | Temp 99.6°F | Resp 18 | Wt 196.0 lb

## 2013-11-18 DIAGNOSIS — L678 Other hair color and hair shaft abnormalities: Secondary | ICD-10-CM

## 2013-11-18 DIAGNOSIS — L738 Other specified follicular disorders: Secondary | ICD-10-CM

## 2013-11-18 DIAGNOSIS — L739 Follicular disorder, unspecified: Secondary | ICD-10-CM

## 2013-11-18 MED ORDER — MUPIROCIN 2 % EX OINT: 1.0000 "application " | TOPICAL_OINTMENT | Freq: Two times a day (BID) | CUTANEOUS | Status: DC

## 2013-11-18 NOTE — Progress Notes (Signed)
Pre visit review using our clinic review tool, if applicable. No additional management support is needed unless otherwise documented below in the visit note. 

## 2013-11-18 NOTE — Progress Notes (Signed)
Subjective:    Patient ID: Dawn Scott, female    DOB: 03/06/1971, 43 y.o.   MRN: [id number]  HPI Patient is a 43 y.o. female presenting for spots on left leg. Pt states that she was seen by [facility] for the spots on 10/30/13, and told to use warm compress/soaks and watchful waiting. Pt states that she feels these have worsened following a long car trip she tool recently. She states that she did notice some drainage on bandaid. Mild Pain to palpation, with some redness and swelling. She does feel as though she may have felt feverish. She does feel over all that it is improving, however was urged to be re-seen by her mother.She denies chills, nausea, vomiting, diarrhea, SOB, chest pain, headache, syncope.    Review of Systems As per HPI and are otherwise negative.   Past Medical History  Diagnosis Date  . Depression   . Anxiety     History   Social History  . Marital Status: Widowed    Spouse Name: N/A    Number of Children: N/A  . Years of Education: N/A   Occupational History  . Not on file.   Social History Main Topics  . Smoking status: Never Smoker   . Smokeless tobacco: Never Used  . Alcohol Use: No  . Drug Use: No  . Sexual Activity: No   Other Topics Concern  . Not on file   Social History Narrative  . No narrative on file    Past Surgical History  Procedure Laterality Date  . Appendectomy    . Ectopic pregnancy surgery    . Mole removal      Family History  Problem Relation Age of Onset  . Depression Mother   . Alcohol abuse Father   . Depression Father   . Alcohol abuse Maternal Grandfather     No Known Allergies  Current Outpatient Prescriptions on File Prior to Visit  Medication Sig Dispense Refill  . buPROPion (WELLBUTRIN XL) 150 MG 24 hr tablet Take 300 mg by mouth daily.      . clonazePAM (KLONOPIN) 0.5 MG tablet Take 0.5 mg by mouth 2 (two) times daily as needed for anxiety.      . DULoxetine (CYMBALTA) 30 MG capsule Take 60  mg by mouth daily.       Marland Kitchen HYDROcodone-acetaminophen (NORCO/VICODIN) 5-325 MG per tablet Take 1-2 tablets by mouth every 4 (four) hours as needed.  15 tablet  0  . loratadine (CLARITIN) 10 MG tablet Take 10 mg by mouth daily as needed.       . meloxicam (MOBIC) 15 MG tablet Take 1 tablet (15 mg total) by mouth daily.  30 tablet  0  . metroNIDAZOLE (METROGEL VAGINAL) 0.75 % vaginal gel Place 1 Applicatorful vaginally at bedtime.  70 g  0  . Multiple Vitamins-Minerals (MULTIVITAMIN WITH MINERALS) tablet Take 1 tablet by mouth daily.      . traZODone (DESYREL) 50 MG tablet Take 50 mg by mouth at bedtime.        No current facility-administered medications on file prior to visit.    EXAM: BP 120/82  Pulse 72  Temp(Src) 99.6 F (37.6 C) (Oral)  Resp 18  Wt 196 lb (88.905 kg)  LMP 11/18/2013     Objective:   Physical Exam  Nursing note and vitals reviewed. Constitutional: She is oriented to person, place, and time. She appears well-developed and well-nourished. No distress.  HENT:  Head: Normocephalic  and atraumatic.  Eyes: Conjunctivae and EOM are normal. Pupils are equal, round, and reactive to light.  Cardiovascular: Normal rate, regular rhythm and intact distal pulses.   Pulmonary/Chest: Effort normal and breath sounds normal. No respiratory distress. She exhibits no tenderness.  Neurological: She is alert and oriented to person, place, and time.  Skin: Skin is warm and dry. No rash noted. She is not diaphoretic. No erythema. No pallor.  Two small scabbed healing wounds located on the posterior left thigh. Mildly ttp. No surrounding erythema, warmth, or fluctuance.  Psychiatric: She has a normal mood and affect. Her behavior is normal. Judgment and thought content normal.     Lab Results  Component Value Date   WBC 4.3 10/27/2013   HGB 13.1 10/27/2013   HCT 38.7 10/27/2013   PLT 233.0 10/27/2013   GLUCOSE 86 10/27/2013   CHOL 202* 10/27/2013   TRIG 146.0 10/27/2013   HDL 54.60 10/27/2013     LDLDIRECT 126.9 11/12/2011   LDLCALC 118* 10/27/2013   ALT 6 10/27/2013   AST 20 10/27/2013   NA 135 10/27/2013   K 3.7 10/27/2013   CL 102 10/27/2013   CREATININE 0.7 10/27/2013   BUN 7 10/27/2013   CO2 28 10/27/2013   TSH 1.74 10/27/2013        Assessment & Plan:  Dawn Scott was seen today for spts on left leg.  Diagnoses and associated orders for this visit:  Folliculitis Comments: Taking a while to resolve. Will add topical bactroban ointment to promote healing. - mupirocin ointment (BACTROBAN) 2 %; Apply 1 application topically 2 (two) times daily.    Pt will continue warm soaks and warm compresses.  Return precautions provided, and patient handout on folliculitis.  Plan to follow up as needed, or for worsening or persistent symptoms despite treatment.  Patient Instructions  Apply Bactroban ointment to areas twice daily and cover with a bandage.  Continue warm water soaks and warm compresses.  If emergency symptoms discussed during visit developed, seek medical attention immediately.  Followup as needed, or for worsening or persistent symptoms despite treatment.

## 2013-11-18 NOTE — Patient Instructions (Addendum)
Apply Bactroban ointment to areas twice daily and cover with a bandage.  Continue warm water soaks and warm compresses.  If emergency symptoms discussed during visit developed, seek medical attention immediately.  Followup as needed, or for worsening or persistent symptoms despite treatment.   Folliculitis  Folliculitis is redness, soreness, and swelling (inflammation) of the hair follicles. This condition can occur anywhere on the body. People with weakened immune systems, diabetes, or obesity have a greater risk of getting folliculitis. CAUSES  Bacterial infection. This is the most common cause.  Fungal infection.  Viral infection.  Contact with certain chemicals, especially oils and tars. Long-term folliculitis can result from bacteria that live in the nostrils. The bacteria may trigger multiple outbreaks of folliculitis over time. SYMPTOMS Folliculitis most commonly occurs on the scalp, thighs, legs, back, buttocks, and areas where hair is shaved frequently. An early sign of folliculitis is a small, white or yellow, pus-filled, itchy lesion (pustule). These lesions appear on a red, inflamed follicle. They are usually less than 0.2 inches (5 mm) wide. When there is an infection of the follicle that goes deeper, it becomes a boil or furuncle. A group of closely packed boils creates a larger lesion (carbuncle). Carbuncles tend to occur in hairy, sweaty areas of the body. DIAGNOSIS  Your caregiver can usually tell what is wrong by doing a physical exam. A sample may be taken from one of the lesions and tested in a lab. This can help determine what is causing your folliculitis. TREATMENT  Treatment may include:  Applying warm compresses to the affected areas.  Taking antibiotic medicines orally or applying them to the skin.  Draining the lesions if they contain a large amount of pus or fluid.  Laser hair removal for cases of long-lasting folliculitis. This helps to prevent regrowth  of the hair. HOME CARE INSTRUCTIONS  Apply warm compresses to the affected areas as directed by your caregiver.  If antibiotics are prescribed, take them as directed. Finish them even if you start to feel better.  You may take over-the-counter medicines to relieve itching.  Do not shave irritated skin.  Follow up with your caregiver as directed. SEEK IMMEDIATE MEDICAL CARE IF:   You have increasing redness, swelling, or pain in the affected area.  You have a fever. MAKE SURE YOU:  Understand these instructions.  Will watch your condition.  Will get help right away if you are not doing well or get worse. Document Released: 06/23/2001 Document Revised: 10/14/2011 Document Reviewed: 07/15/2011 [facility] Patient Information 2015 [place], [place]. This information is not intended to replace advice given to you by your health care provider. Make sure you discuss any questions you have with your health care provider.

## 2014-01-18 ENCOUNTER — Encounter: Payer: Self-pay | Admitting: Family

## 2014-01-18 ENCOUNTER — Ambulatory Visit (INDEPENDENT_AMBULATORY_CARE_PROVIDER_SITE_OTHER): Payer: BC Managed Care – PPO | Admitting: Family

## 2014-01-18 VITALS — BP 100/62 | HR 86 | Ht 69.0 in | Wt 201.0 lb

## 2014-01-18 DIAGNOSIS — F32A Depression, unspecified: Secondary | ICD-10-CM

## 2014-01-18 DIAGNOSIS — Z23 Encounter for immunization: Secondary | ICD-10-CM

## 2014-01-18 DIAGNOSIS — Z0000 Encounter for general adult medical examination without abnormal findings: Secondary | ICD-10-CM

## 2014-01-18 DIAGNOSIS — F3289 Other specified depressive episodes: Secondary | ICD-10-CM

## 2014-01-18 DIAGNOSIS — F329 Major depressive disorder, single episode, unspecified: Secondary | ICD-10-CM

## 2014-01-18 DIAGNOSIS — F411 Generalized anxiety disorder: Secondary | ICD-10-CM

## 2014-01-18 NOTE — Patient Instructions (Signed)
Exercise to Stay Healthy Exercise helps you become and stay healthy. EXERCISE IDEAS AND TIPS Choose exercises that:  You enjoy.  Fit into your day. You do not need to exercise really hard to be healthy. You can do exercises at a slow or medium level and stay healthy. You can:  Stretch before and after working out.  Try yoga, Pilates, or tai chi.  Lift weights.  Walk fast, swim, jog, run, climb stairs, bicycle, dance, or rollerskate.  Take aerobic classes. Exercises that burn about 150 calories:  Running 1  miles in 15 minutes.  Playing volleyball for 45 to 60 minutes.  Washing and waxing a car for 45 to 60 minutes.  Playing touch football for 45 minutes.  Walking 1  miles in 35 minutes.  Pushing a stroller 1  miles in 30 minutes.  Playing basketball for 30 minutes.  Raking leaves for 30 minutes.  Bicycling 5 miles in 30 minutes.  Walking 2 miles in 30 minutes.  Dancing for 30 minutes.  Shoveling snow for 15 minutes.  Swimming laps for 20 minutes.  Walking up stairs for 15 minutes.  Bicycling 4 miles in 15 minutes.  Gardening for 30 to 45 minutes.  Jumping rope for 15 minutes.  Washing windows or floors for 45 to 60 minutes. Document Released: 05/17/2010 Document Revised: 07/07/2011 Document Reviewed: 05/17/2010 [employer] Patient Information 2015 [employer]. This information is not intended to replace advice given to you by your health care provider. Make sure you discuss any questions you have with your health care provider.  

## 2014-01-18 NOTE — Progress Notes (Signed)
Subjective:    Patient ID: Dawn Scott, female    DOB: [birth year], 43 y.o.   MRN: [id number]  HPI 43 year old white female, nonsmoker, with a history of depression and anxiety 10 today for complete physical exam. Denies any concerns. Last Pap smear was June 2015 and was normal. Mammogram is due in October that she wishes to schedule herself. Had her eyes examined on December 01 2013 that was normal. Weight has increased 20+ pounds over the last year.   Review of Systems  Constitutional: Positive for activity change and unexpected weight change.  HENT: Negative.   Eyes: Negative.   Respiratory: Negative.   Cardiovascular: Negative.   Gastrointestinal: Negative.   Endocrine: Negative.   Genitourinary: Negative.   Musculoskeletal: Negative.   Skin: Negative.   Allergic/Immunologic: Negative.   Neurological: Negative.   Hematological: Negative.   Psychiatric/Behavioral: Negative.    Past Medical History  Diagnosis Date  . Depression   . Anxiety     History   Social History  . Marital Status: Widowed    Spouse Name: N/A    Number of Children: N/A  . Years of Education: N/A   Occupational History  . Not on file.   Social History Main Topics  . Smoking status: Never Smoker   . Smokeless tobacco: Never Used  . Alcohol Use: No  . Drug Use: No  . Sexual Activity: No   Other Topics Concern  . Not on file   Social History Narrative  . No narrative on file    Past Surgical History  Procedure Laterality Date  . Appendectomy    . Ectopic pregnancy surgery    . Mole removal      Family History  Problem Relation Age of Onset  . Depression Mother   . Alcohol abuse Father   . Depression Father   . Alcohol abuse Maternal Grandfather     No Known Allergies  Current Outpatient Prescriptions on File Prior to Visit  Medication Sig Dispense Refill  . buPROPion (WELLBUTRIN XL) 150 MG 24 hr tablet Take 300 mg by mouth daily.      . clonazePAM (KLONOPIN) 0.5 MG tablet  Take 0.5 mg by mouth 2 (two) times daily as needed for anxiety.      . DULoxetine (CYMBALTA) 30 MG capsule Take 60 mg by mouth daily.       Marland Kitchen loratadine (CLARITIN) 10 MG tablet Take 10 mg by mouth daily as needed.       . Multiple Vitamins-Minerals (MULTIVITAMIN WITH MINERALS) tablet Take 1 tablet by mouth daily.      . traZODone (DESYREL) 50 MG tablet Take 50 mg by mouth at bedtime.        No current facility-administered medications on file prior to visit.    BP 100/62  Pulse 86  Ht 5\' 9"  (1.753 m)  Wt 201 lb (91.173 kg)  BMI 29.67 kg/m2chart    Objective:   Physical Exam  Constitutional: She is oriented to person, place, and time. She appears well-developed and well-nourished.  HENT:  Head: Normocephalic.  Right Ear: External ear normal.  Left Ear: External ear normal.  Nose: Nose normal.  Mouth/Throat: Oropharynx is clear and moist.  Eyes: Conjunctivae and EOM are normal. Pupils are equal, round, and reactive to light.  Neck: Normal range of motion. Neck supple. No thyromegaly present.  Cardiovascular: Normal rate and normal heart sounds.   Pulmonary/Chest: Effort normal and breath sounds normal.  Abdominal: Soft. Bowel sounds are  normal.  Musculoskeletal: Normal range of motion.  Neurological: She is alert and oriented to person, place, and time. She has normal reflexes.  Skin: Skin is warm and dry.  Psychiatric: She has a normal mood and affect.          Assessment & Plan:  Dawn Scott was seen today for annual exam.  Diagnoses and associated orders for this visit:  Preventative health care  Need for prophylactic vaccination and inoculation against influenza - Flu Vaccine QUAD 36+ mos PF IM (Fluarix Quad PF)  Depression  Generalized anxiety disorder    call the office with any questions or concerns here recheck in one year and sooner as needed. Continues to see psychiatry as scheduled. Exercise 4 times a week.

## 2014-01-18 NOTE — Progress Notes (Signed)
Pre visit review using our clinic review tool, if applicable. No additional management support is needed unless otherwise documented below in the visit note. 

## 2014-06-11 IMAGING — CR DG CHEST 2V
2 series · 2 of 2 positions shown · non-contrast
Comparison: None.

CLINICAL DATA: Chest pain

EXAM:
CHEST  2 VIEW

[w chest pa]
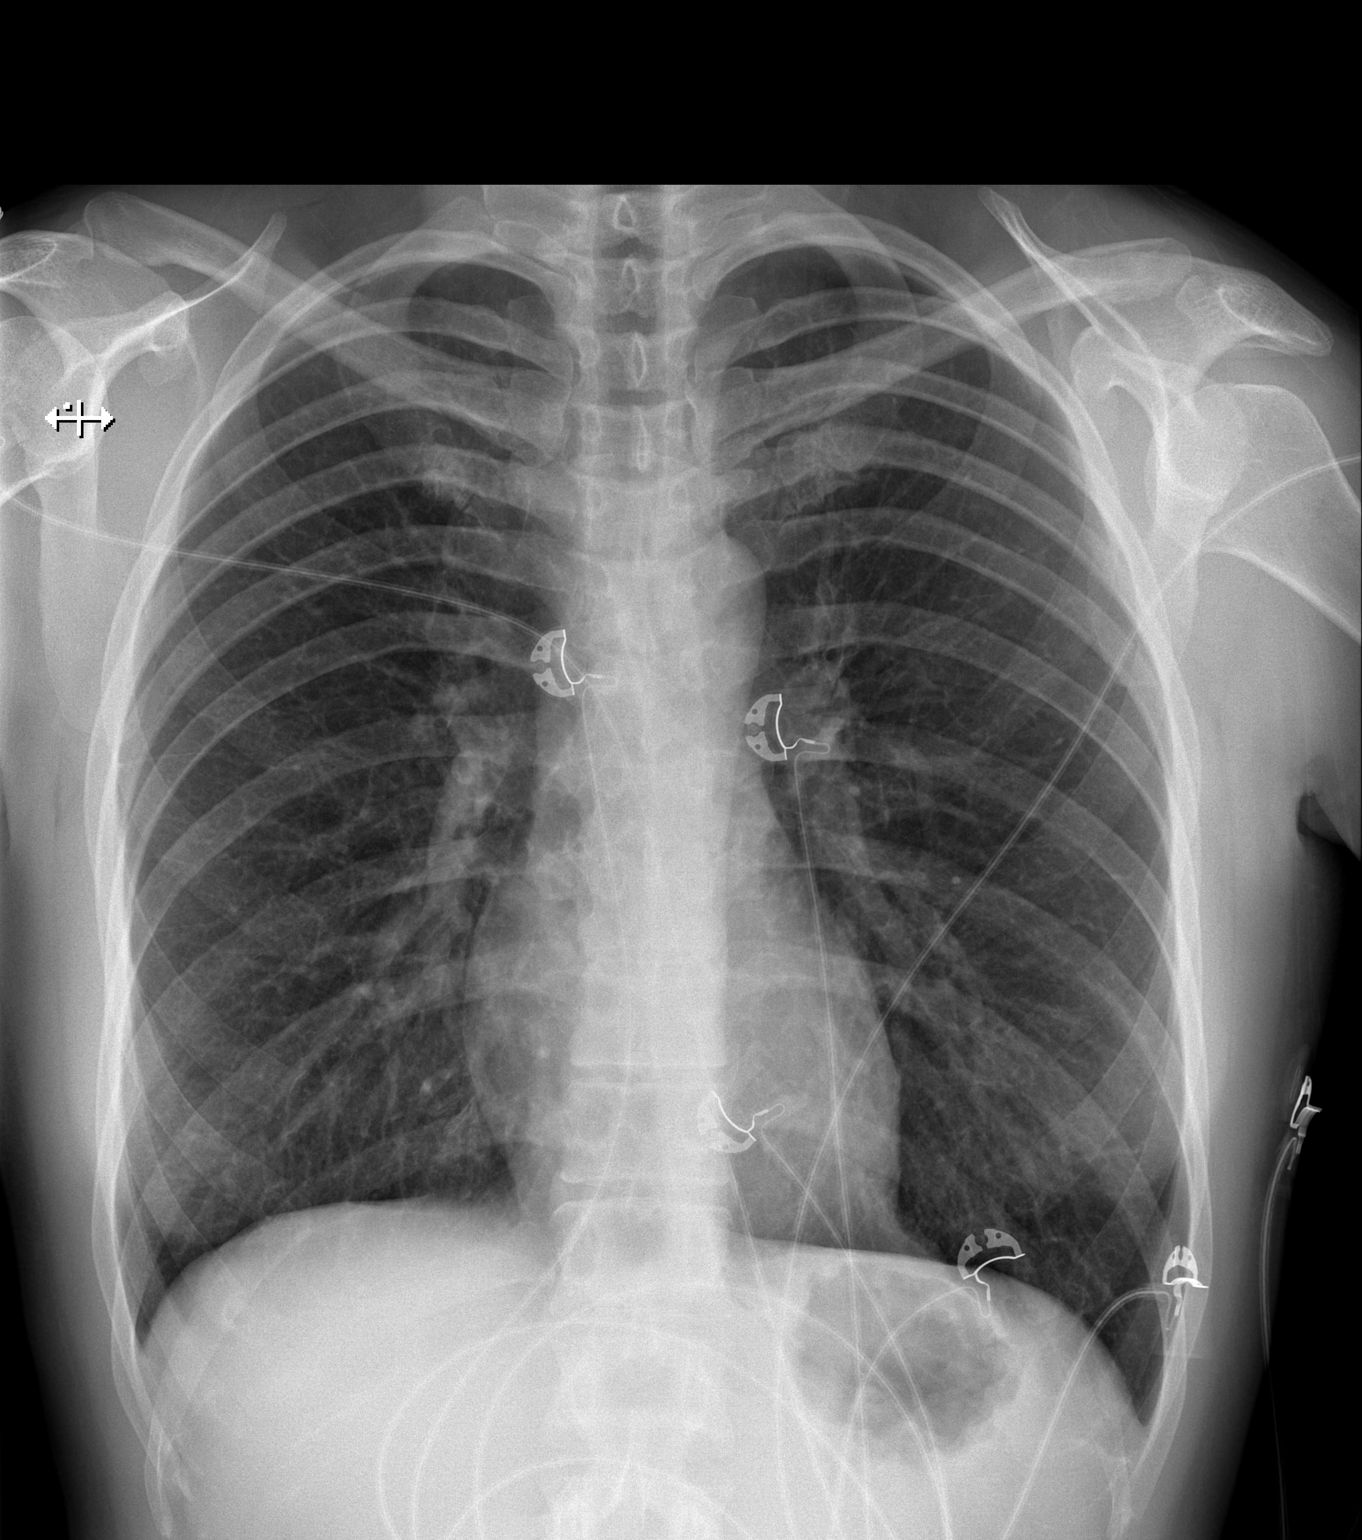

[w chest lat]
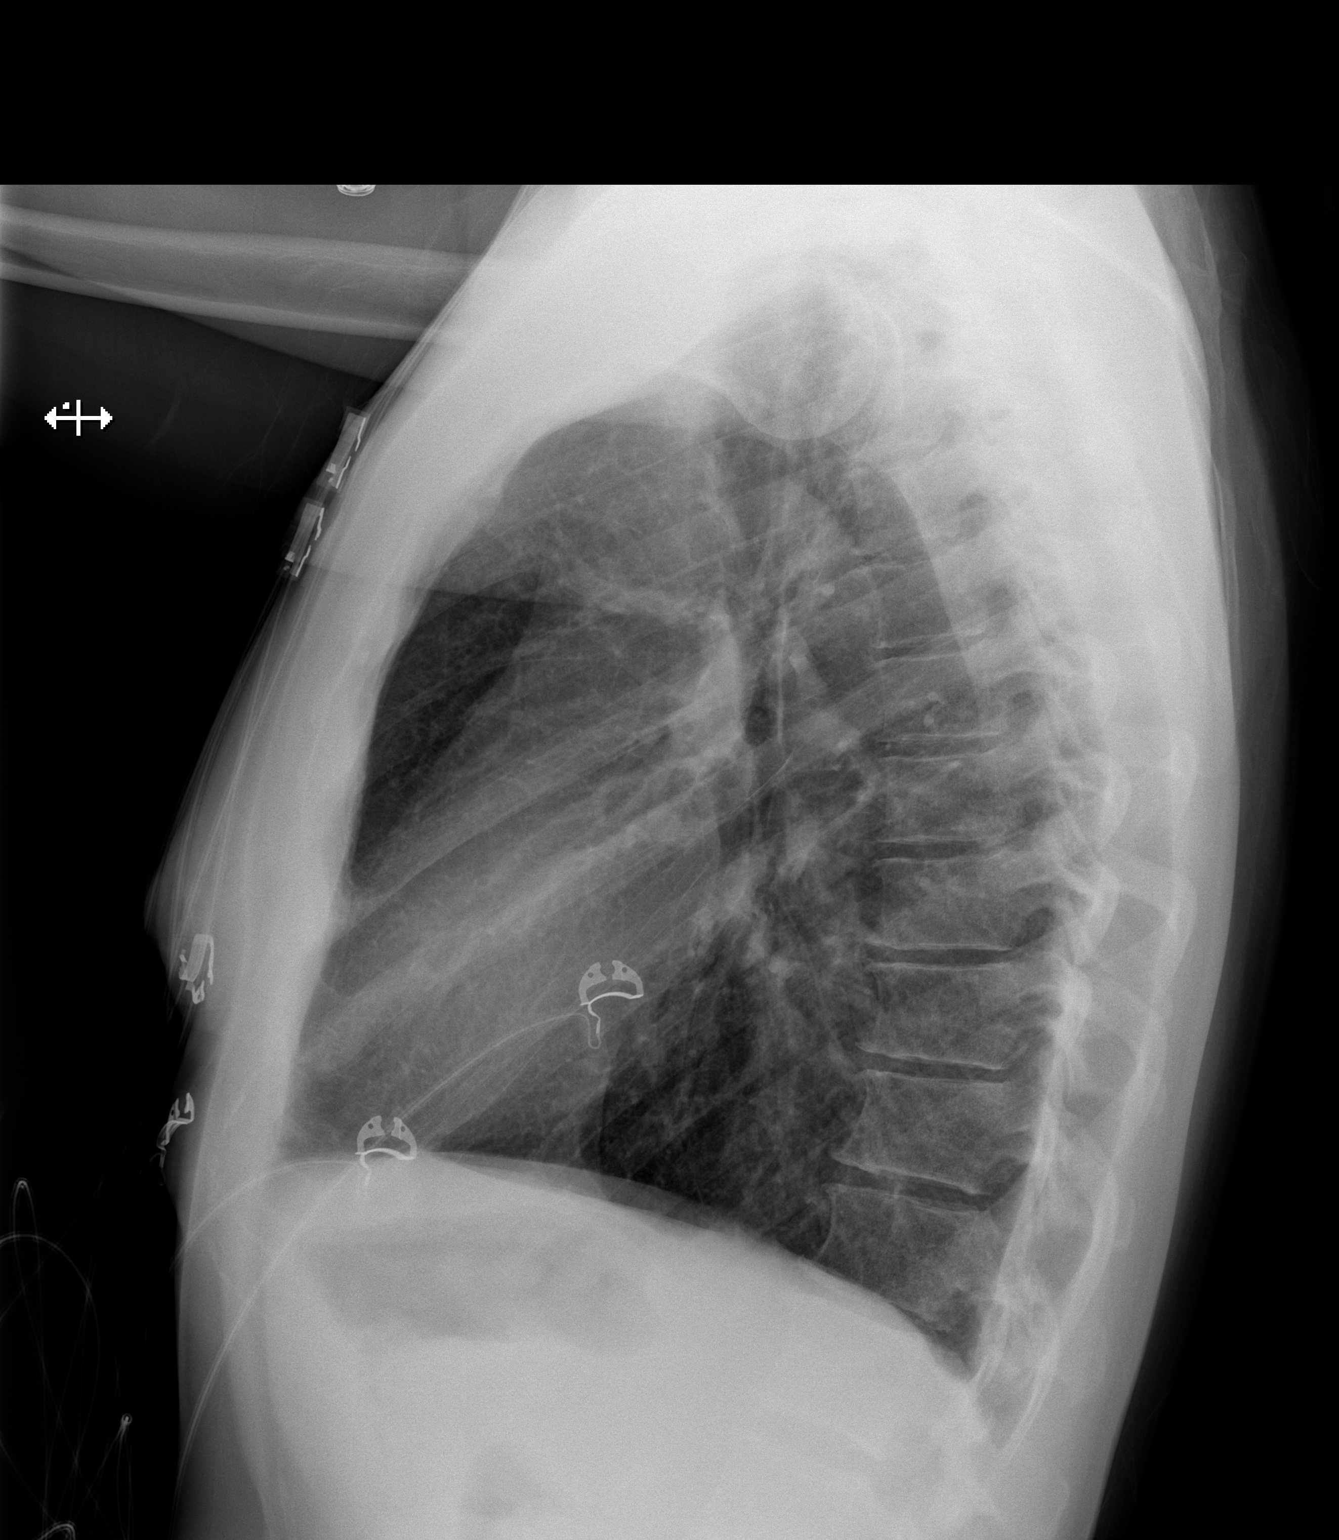

[2 of 2 positions shown; findings below may reference images not displayed]

FINDINGS: The lungs are mildly hyperexpanded but clear. Heart size and
pulmonary vascularity are normal. No adenopathy. No pneumothorax. No
bone lesions.
IMPRESSION: No edema or consolidation.  Lungs mildly hyperexpanded.

## 2014-06-13 ENCOUNTER — Ambulatory Visit (INDEPENDENT_AMBULATORY_CARE_PROVIDER_SITE_OTHER): Payer: BC Managed Care – PPO | Admitting: Family Medicine

## 2014-06-13 ENCOUNTER — Encounter: Payer: Self-pay | Admitting: Family Medicine

## 2014-06-13 VITALS — BP 120/80 | HR 89 | Temp 98.3°F | Ht 69.0 in | Wt 180.9 lb

## 2014-06-13 DIAGNOSIS — J069 Acute upper respiratory infection, unspecified: Secondary | ICD-10-CM

## 2014-06-13 NOTE — Patient Instructions (Signed)
BEFORE YOU LEAVE: -schedule new patient visit  INSTRUCTIONS FOR UPPER RESPIRATORY INFECTION:  -plenty of rest and fluids  -nasal saline wash 2-3 times daily (use prepackaged nasal saline or bottled/distilled water if making your own)   -can use tylenol or ibuprofen as directed for aches and sorethroat  -in the winter time, using a humidifier at night is helpful (please follow cleaning instructions)  -if you are taking a cough medication - use only as directed, may also try a teaspoon of honey to coat the throat and throat lozenges  -for sore throat, salt water gargles can help  -follow up if you have fevers, facial pain, tooth pain, difficulty breathing or are worsening or not getting better in 5-7 days

## 2014-06-13 NOTE — Progress Notes (Signed)
Pre visit review using our clinic review tool, if applicable. No additional management support is needed unless otherwise documented below in the visit note. 

## 2014-06-13 NOTE — Progress Notes (Signed)
HPI:  Sore throat: -started: started 2 days ago -symptomscongestion, sore throat, laryngitis -denies:fever, SOB, NVD, tooth pain, facial pain -has tried: ibuprofen -sick contacts/travel/risks: denies flu exposure, tick exposure, strep exposure or or Ebola risks - daughter with URI infection  ROS: See pertinent positives and negatives per HPI.  Past Medical History  Diagnosis Date  . Depression   . Anxiety     Past Surgical History  Procedure Laterality Date  . Appendectomy    . Ectopic pregnancy surgery    . Mole removal      Family History  Problem Relation Age of Onset  . Depression Mother   . Alcohol abuse Father   . Depression Father   . Alcohol abuse Maternal Grandfather     History   Social History  . Marital Status: Widowed    Spouse Name: N/A  . Number of Children: N/A  . Years of Education: N/A   Social History Main Topics  . Smoking status: Never Smoker   . Smokeless tobacco: Never Used  . Alcohol Use: No  . Drug Use: No  . Sexual Activity: No   Other Topics Concern  . None   Social History Narrative     Current outpatient prescriptions:  .  buPROPion (WELLBUTRIN XL) 150 MG 24 hr tablet, Take 300 mg by mouth daily., Disp: , Rfl:  .  clonazePAM (KLONOPIN) 0.5 MG tablet, Take 0.25 mg by mouth daily. , Disp: , Rfl:  .  DULoxetine (CYMBALTA) 30 MG capsule, Take 60 mg by mouth daily. , Disp: , Rfl:  .  estazolam (PROSOM) 1 MG tablet, Take 1 mg by mouth at bedtime., Disp: , Rfl:  .  loratadine (CLARITIN) 10 MG tablet, Take 10 mg by mouth daily as needed. , Disp: , Rfl:  .  Multiple Vitamins-Minerals (MULTIVITAMIN WITH MINERALS) tablet, Take 1 tablet by mouth daily., Disp: , Rfl:  .  OLANZapine (ZYPREXA) 2.5 MG tablet, Take 2.5 mg by mouth at bedtime., Disp: , Rfl:   EXAM:  Filed Vitals:   06/13/14 1057  BP: 120/80  Pulse: 89  Temp: 98.3 F (36.8 C)    Body mass index is 26.7 kg/(m^2).  GENERAL: vitals reviewed and listed above, alert,  oriented, appears well hydrated and in no acute distress  HEENT: atraumatic, conjunttiva clear, no obvious abnormalities on inspection of external nose and ears, normal appearance of ear canals and TMs, clear nasal congestion, mild post oropharyngeal erythema with PND, no tonsillar edema or exudate, no sinus TTP  NECK: no obvious masses on inspection  LUNGS: clear to auscultation bilaterally, no wheezes, rales or rhonchi, good air movement  CV: HRRR, no peripheral edema  MS: moves all extremities without noticeable abnormality  PSYCH: pleasant and cooperative, no obvious depression or anxiety  ASSESSMENT AND PLAN:  Discussed the following assessment and plan:  Acute upper respiratory infection  -given HPI and exam findings today, a serious infection or illness is unlikely. We discussed potential etiologies, with VURI being most likely, and advised supportive care and monitoring. We discussed treatment side effects, likely course, antibiotic misuse, transmission, and signs of developing a serious illness. -of course, we advised to return or notify a doctor immediately if symptoms worsen or persist or new concerns arise.    Patient Instructions  BEFORE YOU LEAVE: -schedule new patient visit  INSTRUCTIONS FOR UPPER RESPIRATORY INFECTION:  -plenty of rest and fluids  -nasal saline wash 2-3 times daily (use prepackaged nasal saline or bottled/distilled water if making your  own)   -can use tylenol or ibuprofen as directed for aches and sorethroat  -in the winter time, using a humidifier at night is helpful (please follow cleaning instructions)  -if you are taking a cough medication - use only as directed, may also try a teaspoon of honey to coat the throat and throat lozenges  -for sore throat, salt water gargles can help  -follow up if you have fevers, facial pain, tooth pain, difficulty breathing or are worsening or not getting better in 5-7 days      Moo Gravley, Jarrett Soho R.

## 2014-07-04 ENCOUNTER — Telehealth: Payer: Self-pay | Admitting: Family

## 2014-07-04 NOTE — Telephone Encounter (Signed)
[facility]  Patient Name: Dawn Scott  DOB: December 26, 1970    Initial Comment Caller states she recently had sore throat, has swallowing difficulty, thinks uvula still enlarged   Nurse Assessment  Nurse: Harlow Mares, RN, Suanne Marker Date/Time (Eastern Time): 07/04/2014 2:55:22 PM  Confirm and document reason for call. If symptomatic, describe symptoms. ---Caller states she recently had sore throat, has swallowing difficulty, thinks uvula still enlarged. Not taking abx. Reports no longer sore throat. She reports that it feels different when she swallows.  Has the patient traveled out of the country within the last 30 days? ---Not Applicable  Does the patient require triage? ---Yes  Related visit to physician within the last 2 weeks? ---No  Does the PT have any chronic conditions? (i.e. diabetes, asthma, etc.) ---No  Did the patient indicate they were pregnant? ---No     Guidelines    Guideline Title Affirmed Question Affirmed Notes  Swallowing Difficulty [1] Swallowing difficulty AND [2] cause unknown (Exception: difficulty swallowing is a chronic symptom)    Final Disposition User   See Physician within 24 Hours Harlow Mares, RN, Suanne Marker    Comments  Appt scheduled with Dr. Jearl Klinefelter for 07/05/14 @ 11:45 am. Osvaldo Human informed and voiced understanding.

## 2014-07-05 ENCOUNTER — Ambulatory Visit (INDEPENDENT_AMBULATORY_CARE_PROVIDER_SITE_OTHER): Payer: BLUE CROSS/BLUE SHIELD | Admitting: Family Medicine

## 2014-07-05 ENCOUNTER — Encounter: Payer: Self-pay | Admitting: Family Medicine

## 2014-07-05 VITALS — BP 120/72 | HR 89 | Temp 98.5°F | Wt 176.0 lb

## 2014-07-05 DIAGNOSIS — R0989 Other specified symptoms and signs involving the circulatory and respiratory systems: Secondary | ICD-10-CM

## 2014-07-05 DIAGNOSIS — F458 Other somatoform disorders: Secondary | ICD-10-CM

## 2014-07-05 NOTE — Patient Instructions (Signed)
Consider OTC Flonase or Nasacort AQ for postnasal drainage. Continue with the Claritan daily.

## 2014-07-05 NOTE — Progress Notes (Signed)
   Subjective:    Patient ID: Dawn Scott, female    DOB: Mar 04, 1971, 44 y.o.   MRN: [id number]  HPI Patient seen with initial chief complaint of "difficulty swallowing". On further questioning, she does not have any true dysphagia. She has sensation of clearing her throat but no true difficulty swallowing. She had recent viral URI and has some sore throat then which has now resolved. She is not aware of any obvious postnasal drip. She does have some allergies and takes Claritin. No pain with swallowing. No recent GERD symptoms. No appetite or weight changes. No hoarseness. No cough.  Past Medical History  Diagnosis Date  . Depression   . Anxiety    Past Surgical History  Procedure Laterality Date  . Appendectomy    . Ectopic pregnancy surgery    . Mole removal      reports that she has never smoked. She has never used smokeless tobacco. She reports that she does not drink alcohol or use illicit drugs. family history includes Alcohol abuse in her father and maternal grandfather; Depression in her father and mother. No Known Allergies    Review of Systems  Constitutional: Negative for fever, chills, appetite change and unexpected weight change.  HENT: Negative for congestion, postnasal drip and voice change.   Respiratory: Negative for cough.   Hematological: Negative for adenopathy.       Objective:   Physical Exam  Constitutional: She appears well-developed and well-nourished.  HENT:  Right Ear: External ear normal.  Left Ear: External ear normal.  Mouth/Throat: Oropharynx is clear and moist.  She has some thick white mucus draining into posterior pharynx. Tonsils appear normal. No erythema or exudate  Neck: Neck supple. No thyromegaly present.  Cardiovascular: Normal rate and regular rhythm.   Pulmonary/Chest: Effort normal and breath sounds normal. No respiratory distress. She has no wheezes. She has no rales.  Lymphadenopathy:    She has no cervical adenopathy.           Assessment & Plan:  Mild sensation of globus. She is not describing any true dysphagia. Question postnasal drip related. Add Flonase or Nasacort AQ to Claritin. Follow-up with primary in 2 weeks if not improving

## 2014-07-05 NOTE — Progress Notes (Signed)
Pre visit review using our clinic review tool, if applicable. No additional management support is needed unless otherwise documented below in the visit note. 

## 2014-07-05 NOTE — Telephone Encounter (Signed)
appt scheduled for 3.9.16

## 2014-08-31 ENCOUNTER — Ambulatory Visit: Payer: Self-pay | Admitting: Family Medicine

## 2014-11-14 ENCOUNTER — Telehealth: Payer: Self-pay | Admitting: Family

## 2014-11-14 NOTE — Telephone Encounter (Signed)
Patient would like to have B12 checked on 11/20/14 cpx lab orders.

## 2014-11-15 ENCOUNTER — Other Ambulatory Visit: Payer: Self-pay

## 2014-11-15 DIAGNOSIS — Z0000 Encounter for general adult medical examination without abnormal findings: Secondary | ICD-10-CM

## 2014-11-15 NOTE — Telephone Encounter (Signed)
Ok per Cecil ok to add B12. Labs ordered.

## 2014-11-20 ENCOUNTER — Other Ambulatory Visit (INDEPENDENT_AMBULATORY_CARE_PROVIDER_SITE_OTHER): Payer: BLUE CROSS/BLUE SHIELD

## 2014-11-20 DIAGNOSIS — Z0000 Encounter for general adult medical examination without abnormal findings: Secondary | ICD-10-CM

## 2014-11-20 LAB — CBC WITH DIFFERENTIAL/PLATELET
BASOS ABS: 0 10*3/uL (ref 0.0–0.1)
Basophils Relative: 0.5 % (ref 0.0–3.0)
EOS PCT: 1.6 % (ref 0.0–5.0)
Eosinophils Absolute: 0.1 10*3/uL (ref 0.0–0.7)
HEMATOCRIT: 40.8 % (ref 36.0–46.0)
Hemoglobin: 13.7 g/dL (ref 12.0–15.0)
LYMPHS ABS: 1.8 10*3/uL (ref 0.7–4.0)
Lymphocytes Relative: 36.1 % (ref 12.0–46.0)
MCHC: 33.6 g/dL (ref 30.0–36.0)
MCV: 87.4 fl (ref 78.0–100.0)
MONO ABS: 0.4 10*3/uL (ref 0.1–1.0)
Monocytes Relative: 7.2 % (ref 3.0–12.0)
NEUTROS PCT: 54.6 % (ref 43.0–77.0)
Neutro Abs: 2.7 10*3/uL (ref 1.4–7.7)
Platelets: 283 10*3/uL (ref 150.0–400.0)
RBC: 4.67 Mil/uL (ref 3.87–5.11)
RDW: 12.9 % (ref 11.5–15.5)
WBC: 4.9 10*3/uL (ref 4.0–10.5)

## 2014-11-20 LAB — BASIC METABOLIC PANEL
BUN: 11 mg/dL (ref 6–23)
CO2: 27 meq/L (ref 19–32)
Calcium: 8.9 mg/dL (ref 8.4–10.5)
Chloride: 100 mEq/L (ref 96–112)
Creatinine, Ser: 0.74 mg/dL (ref 0.40–1.20)
GFR: 91.09 mL/min (ref >60.00)
Glucose, Bld: 72 mg/dL (ref 70–99)
Potassium: 3.8 mEq/L (ref 3.5–5.1)
SODIUM: 134 meq/L — AB (ref 135–145)

## 2014-11-20 LAB — TSH: TSH: 1.93 u[IU]/mL (ref 0.35–4.50)

## 2014-11-20 LAB — LIPID PANEL
CHOLESTEROL: 176 mg/dL (ref 0–200)
HDL: 53.7 mg/dL (ref >39.00)
LDL Cholesterol: 106 mg/dL — ABNORMAL HIGH (ref 0–99)
NonHDL: 122.3
Total CHOL/HDL Ratio: 3
Triglycerides: 84 mg/dL (ref 0.0–149.0)
VLDL: 16.8 mg/dL (ref 0.0–40.0)

## 2014-11-20 LAB — HEPATIC FUNCTION PANEL
ALT: 7 U/L (ref 0–35)
AST: 15 U/L (ref 0–37)
Albumin: 4.1 g/dL (ref 3.5–5.2)
Alkaline Phosphatase: 47 U/L (ref 39–117)
Bilirubin, Direct: 0.1 mg/dL (ref 0.0–0.3)
TOTAL PROTEIN: 6.7 g/dL (ref 6.0–8.3)
Total Bilirubin: 0.4 mg/dL (ref 0.2–1.2)

## 2014-11-20 LAB — POCT URINALYSIS DIPSTICK
Bilirubin, UA: NEGATIVE
Blood, UA: NEGATIVE
GLUCOSE UA: NEGATIVE
Ketones, UA: NEGATIVE
Leukocytes, UA: NEGATIVE
NITRITE UA: NEGATIVE
Protein, UA: NEGATIVE
Spec Grav, UA: 1.01
Urobilinogen, UA: 0.2
pH, UA: 7.5

## 2014-11-20 LAB — VITAMIN B12: VITAMIN B 12: 851 pg/mL (ref 211–911)

## 2014-11-24 ENCOUNTER — Ambulatory Visit (INDEPENDENT_AMBULATORY_CARE_PROVIDER_SITE_OTHER): Payer: BLUE CROSS/BLUE SHIELD | Admitting: Family

## 2014-11-24 ENCOUNTER — Encounter: Payer: Self-pay | Admitting: Family

## 2014-11-24 VITALS — BP 132/72 | HR 76 | Temp 98.0°F | Ht 69.0 in | Wt 186.1 lb

## 2014-11-24 DIAGNOSIS — Z23 Encounter for immunization: Secondary | ICD-10-CM

## 2014-11-24 DIAGNOSIS — Z0000 Encounter for general adult medical examination without abnormal findings: Secondary | ICD-10-CM

## 2014-11-24 DIAGNOSIS — F32A Depression, unspecified: Secondary | ICD-10-CM

## 2014-11-24 DIAGNOSIS — F329 Major depressive disorder, single episode, unspecified: Secondary | ICD-10-CM

## 2014-11-24 NOTE — Patient Instructions (Signed)
DASH Eating Plan °DASH stands for "Dietary Approaches to Stop Hypertension." The DASH eating plan is a healthy eating plan that has been shown to reduce high blood pressure (hypertension). Additional health benefits may include reducing the risk of type 2 diabetes mellitus, heart disease, and stroke. The DASH eating plan may also help with weight loss. °WHAT DO I NEED TO KNOW ABOUT THE DASH EATING PLAN? °For the DASH eating plan, you will follow these general guidelines: °· Choose foods with a percent daily value for sodium of less than 5% (as listed on the food label). °· Use salt-free seasonings or herbs instead of table salt or sea salt. °· Check with your health care provider or pharmacist before using salt substitutes. °· Eat lower-sodium products, often labeled as "lower sodium" or "no salt added." °· Eat fresh foods. °· Eat more vegetables, fruits, and low-fat dairy products. °· Choose whole grains. Look for the word "whole" as the first word in the ingredient list. °· Choose fish and skinless chicken or turkey more often than red meat. Limit fish, poultry, and meat to 6 oz (170 g) each day. °· Limit sweets, desserts, sugars, and sugary drinks. °· Choose heart-healthy fats. °· Limit cheese to 1 oz (28 g) per day. °· Eat more home-cooked food and less restaurant, buffet, and fast food. °· Limit fried foods. °· Cook foods using methods other than frying. °· Limit canned vegetables. If you do use them, rinse them well to decrease the sodium. °· When eating at a restaurant, ask that your food be prepared with less salt, or no salt if possible. °WHAT FOODS CAN I EAT? °Seek help from a dietitian for individual calorie needs. °Grains °Whole grain or whole wheat bread. Brown rice. Whole grain or whole wheat pasta. Quinoa, bulgur, and whole grain cereals. Low-sodium cereals. Corn or whole wheat flour tortillas. Whole grain cornbread. Whole grain crackers. Low-sodium crackers. °Vegetables °Fresh or frozen vegetables  (raw, steamed, roasted, or grilled). Low-sodium or reduced-sodium tomato and vegetable juices. Low-sodium or reduced-sodium tomato sauce and paste. Low-sodium or reduced-sodium canned vegetables.  °Fruits °All fresh, canned (in natural juice), or frozen fruits. °Meat and Other Protein Products °Ground beef (85% or leaner), grass-fed beef, or beef trimmed of fat. Skinless chicken or turkey. Ground chicken or turkey. Pork trimmed of fat. All fish and seafood. Eggs. Dried beans, peas, or lentils. Unsalted nuts and seeds. Unsalted canned beans. °Dairy °Low-fat dairy products, such as skim or 1% milk, 2% or reduced-fat cheeses, low-fat ricotta or cottage cheese, or plain low-fat yogurt. Low-sodium or reduced-sodium cheeses. °Fats and Oils °Tub margarines without trans fats. Light or reduced-fat mayonnaise and salad dressings (reduced sodium). Avocado. Safflower, olive, or canola oils. Natural peanut or almond butter. °Other °Unsalted popcorn and pretzels. °The items listed above may not be a complete list of recommended foods or beverages. Contact your dietitian for more options. °WHAT FOODS ARE NOT RECOMMENDED? °Grains °White bread. White pasta. White rice. Refined cornbread. Bagels and croissants. Crackers that contain trans fat. °Vegetables °Creamed or fried vegetables. Vegetables in a cheese sauce. Regular canned vegetables. Regular canned tomato sauce and paste. Regular tomato and vegetable juices. °Fruits °Dried fruits. Canned fruit in light or heavy syrup. Fruit juice. °Meat and Other Protein Products °Fatty cuts of meat. Ribs, chicken wings, bacon, sausage, bologna, salami, chitterlings, fatback, hot dogs, bratwurst, and packaged luncheon meats. Salted nuts and seeds. Canned beans with salt. °Dairy °Whole or 2% milk, cream, half-and-half, and cream cheese. Whole-fat or sweetened yogurt. Full-fat   cheeses or blue cheese. Nondairy creamers and whipped toppings. Processed cheese, cheese spreads, or cheese  curds. °Condiments °Onion and garlic salt, seasoned salt, table salt, and sea salt. Canned and packaged gravies. Worcestershire sauce. Tartar sauce. Barbecue sauce. Teriyaki sauce. Soy sauce, including reduced sodium. Steak sauce. Fish sauce. Oyster sauce. Cocktail sauce. Horseradish. Ketchup and mustard. Meat flavorings and tenderizers. Bouillon cubes. Hot sauce. Tabasco sauce. Marinades. Taco seasonings. Relishes. °Fats and Oils °Butter, stick margarine, lard, shortening, ghee, and bacon fat. Coconut, palm kernel, or palm oils. Regular salad dressings. °Other °Pickles and olives. Salted popcorn and pretzels. °The items listed above may not be a complete list of foods and beverages to avoid. Contact your dietitian for more information. °WHERE CAN I FIND MORE INFORMATION? °National Heart, Lung, and [employer] Institute: www.nhlbi.nih.gov/health/health-topics/topics/dash/ °Document Released: 04/03/2011 Document Revised: 08/29/2013 Document Reviewed: 02/16/2013 °ExitCare® Patient Information ©2015 [employer]. This information is not intended to replace advice given to you by your health care provider. Make sure you discuss any questions you have with your health care provider. ° °

## 2014-11-24 NOTE — Progress Notes (Signed)
Pre visit review using our clinic review tool, if applicable. No additional management support is needed unless otherwise documented below in the visit note. 

## 2014-11-24 NOTE — Progress Notes (Signed)
Subjective:    Patient ID: Dawn Scott, female    DOB: 1970/05/22, 44 y.o.   MRN: [id number]  HPI Comments: 44 year old caucasian female here today for a routine physical.  States that she has no complaints at this time.  States that she has no pain and has not experienced any shortness of breath. Up to date on pap smear. Mammogram due in November. Needs tdap      Review of Systems  Constitutional: Negative.   HENT: Negative.   Eyes: Negative.   Respiratory: Negative.   Cardiovascular: Negative.   Gastrointestinal: Negative.   Endocrine: Negative.   Genitourinary: Negative.   Musculoskeletal: Negative.   Skin: Negative.   Allergic/Immunologic: Negative.   Neurological: Negative.   Hematological: Negative.   Psychiatric/Behavioral: Negative.    Past Medical History  Diagnosis Date  . Depression   . Anxiety     History   Social History  . Marital Status: Widowed    Spouse Name: N/A  . Number of Children: N/A  . Years of Education: N/A   Occupational History  . Not on file.   Social History Main Topics  . Smoking status: Never Smoker   . Smokeless tobacco: Never Used  . Alcohol Use: No  . Drug Use: No  . Sexual Activity: No   Other Topics Concern  . Not on file   Social History Narrative    Past Surgical History  Procedure Laterality Date  . Appendectomy    . Ectopic pregnancy surgery    . Mole removal      Family History  Problem Relation Age of Onset  . Depression Mother   . Alcohol abuse Father   . Depression Father   . Alcohol abuse Maternal Grandfather     No Known Allergies  Current Outpatient Prescriptions on File Prior to Visit  Medication Sig Dispense Refill  . buPROPion (WELLBUTRIN XL) 150 MG 24 hr tablet Take 300 mg by mouth daily.    . clonazePAM (KLONOPIN) 0.5 MG tablet Take 0.25 mg by mouth daily.     . DULoxetine (CYMBALTA) 30 MG capsule Take 60 mg by mouth daily.     Marland Kitchen estazolam (PROSOM) 1 MG tablet Take 1 mg by mouth at  bedtime.    Marland Kitchen loratadine (CLARITIN) 10 MG tablet Take 10 mg by mouth daily as needed.     . Multiple Vitamins-Minerals (MULTIVITAMIN WITH MINERALS) tablet Take 1 tablet by mouth daily.     No current facility-administered medications on file prior to visit.    BP 132/72 mmHg  Pulse 76  Temp(Src) 98 F (36.7 C) (Oral)  Ht 5\' 9"  (1.753 m)  Wt 186 lb 1 oz (84.397 kg)  BMI 27.46 kg/m2  SpO2 99%  LMP 07/28/2016chart    Objective:   Physical Exam  Constitutional: She is oriented to person, place, and time. She appears well-developed and well-nourished.  HENT:  Head: Normocephalic and atraumatic.  Right Ear: External ear normal.  Left Ear: External ear normal.  Mouth/Throat: Oropharynx is clear and moist.  Neck: Normal range of motion. Neck supple.  Cardiovascular: Normal rate, regular rhythm, normal heart sounds and intact distal pulses.   Pulmonary/Chest: Effort normal and breath sounds normal.  Abdominal: Soft. Bowel sounds are normal.  Musculoskeletal: Normal range of motion.  Neurological: She is alert and oriented to person, place, and time. She has normal reflexes.  Skin: Skin is warm and dry.  Psychiatric: She has a normal mood and affect.  Her behavior is normal. Judgment and thought content normal.          Assessment & Plan:  Diagnoses and all orders for this visit:  Preventative health care  Need for prophylactic vaccination with combined diphtheria-tetanus-pertussis (DTP) vaccine Orders: -     Tdap vaccine greater than or equal to 7yo IM  Depression   Labs reviewed today.  Patient to receive Tetanus Vaccine today.  Education given to patient concerning physical activity and healthy eating. Call the office with any questions or concerns. Recheck in 6 months and sooner as needed.  Patient to return to office if she has any complaints or concerns.

## 2014-12-29 ENCOUNTER — Telehealth: Payer: Self-pay

## 2014-12-29 NOTE — Telephone Encounter (Signed)
The pt is hoping to be set up with a phychiatric office. She states she wants to change from her physch office to a [facility] office.    Pt's callback - [phone]

## 2015-01-02 NOTE — Telephone Encounter (Signed)
Are we speaking of Psychiatry? If so, she can set that up. No referral needed. [facility]

## 2015-01-03 NOTE — Telephone Encounter (Signed)
Left message on machine for patient to return our call 

## 2015-01-04 NOTE — Telephone Encounter (Signed)
Spoke with patient.

## 2015-01-22 ENCOUNTER — Ambulatory Visit (INDEPENDENT_AMBULATORY_CARE_PROVIDER_SITE_OTHER): Payer: BLUE CROSS/BLUE SHIELD | Admitting: *Deleted

## 2015-01-22 DIAGNOSIS — Z23 Encounter for immunization: Secondary | ICD-10-CM

## 2015-06-13 ENCOUNTER — Ambulatory Visit: Payer: Self-pay | Admitting: Family Medicine

## 2015-06-28 ENCOUNTER — Encounter: Payer: Self-pay | Admitting: Family Medicine

## 2015-06-28 ENCOUNTER — Ambulatory Visit (INDEPENDENT_AMBULATORY_CARE_PROVIDER_SITE_OTHER): Payer: BLUE CROSS/BLUE SHIELD | Admitting: Family Medicine

## 2015-06-28 VITALS — BP 131/87 | HR 78 | Temp 98.4°F | Resp 20 | Ht 69.0 in | Wt 196.5 lb

## 2015-06-28 DIAGNOSIS — F32A Depression, unspecified: Secondary | ICD-10-CM

## 2015-06-28 DIAGNOSIS — F329 Major depressive disorder, single episode, unspecified: Secondary | ICD-10-CM

## 2015-06-28 DIAGNOSIS — Z7189 Other specified counseling: Secondary | ICD-10-CM | POA: Diagnosis not present

## 2015-06-28 DIAGNOSIS — E559 Vitamin D deficiency, unspecified: Secondary | ICD-10-CM | POA: Insufficient documentation

## 2015-06-28 DIAGNOSIS — G4700 Insomnia, unspecified: Secondary | ICD-10-CM

## 2015-06-28 DIAGNOSIS — Z0000 Encounter for general adult medical examination without abnormal findings: Secondary | ICD-10-CM | POA: Insufficient documentation

## 2015-06-28 DIAGNOSIS — N809 Endometriosis, unspecified: Secondary | ICD-10-CM | POA: Diagnosis not present

## 2015-06-28 DIAGNOSIS — Z7689 Persons encountering health services in other specified circumstances: Secondary | ICD-10-CM

## 2015-06-28 HISTORY — DX: Endometriosis, unspecified: N80.9

## 2015-06-28 NOTE — Progress Notes (Signed)
Patient ID: Dawn Scott, female   DOB: [birth year], 45 y.o.   MRN: [id number]      Patient ID: Dawn Scott, female  DOB: 1971-01-30, 45 y.o.   MRN: [id number]  Subjective:  Dawn Scott is a 45 y.o. female present for establishment of care with new doctor, within [employer] [facility]. Prior [facility] pt of  Dutch Quint, NP. All past medical history, surgical history, allergies, family history, immunizations, medications and social history were updated  in the electronic medical record today.  Preventive health/CPE completed 11/14/2014. Active problem list includes endometriosis,  insomnia and depression. With a [facility] admission 06/26/2012.  Patient lost her husband in a accident of the job 2013, and has struggled with depression. She is followed by Donnal Moat (under Dr.Cottle) for scripts/Psych. She also routinely follows with a therapists, Rhona Raider therapist.  Depression screen [facility] 2/9 06/28/2015  Decreased Interest 1  Down, Depressed, Hopeless 0  PHQ - 2 Score 1   GAD 7 : Generalized Anxiety Score 06/28/2015  Nervous, Anxious, on Edge 0  Control/stop worrying 0  Worry too much - different things 0  Trouble relaxing 0  Easily annoyed or irritable 0  Afraid - awful might happen 0    Negative Mood disorder screen.   Health maintenance:  Colonoscopy:No Fhx colon cancer. Mammogram: [facility] 2016, normal, GYN orders; Fhx aunt Cervical cancer screening: 2016 GYN,completed yearly.  Immunizations: Flu 2016 UTD, tdap 2016 UTD.  Infectious disease screening: HIV indicated if not completed by GYN  Labs collected a gynecological office 06/04/2015: Glucose 78, BUN 10, creatinine 0.80, GFR 91, sodium 136, potassium 4.2, chloride 95, CO2 22, calcium 9.3, protein 6.8, albumin 4.4, direct 0.4, alk phosphatase 49, AST 19, ALT 6. WBC 6.2, RBC 4.39, hemoglobin 12.7, hematocrit 36.8, MCV 84 RDW 13.1 platelet 297. Cholesterol 198, triglycerides 179, HDL 63, VLDL 36, LDL 99. Vitamin D 26.9, patient started on  supplementation through gynecology. TSH 2.17  Past Medical History  Diagnosis Date  . Depression   . Anxiety   . Endometriosis     followed by GYN  . Allergy   . Chicken pox    No Known Allergies Past Surgical History  Procedure Laterality Date  . Appendectomy    . Ectopic pregnancy surgery    . Mole removal    . Adenoidectomy    . Septoplasty    . Wisdom tooth extraction    . Dilation and curettage of uterus  2004   Family History  Problem Relation Age of Onset  . Depression Mother   . Alcohol abuse Father   . Depression Father   . Alcohol abuse Maternal Grandfather    Social History   Social History  . Marital Status: Widowed    Spouse Name: N/A  . Number of Children: N/A  . Years of Education: N/A   Occupational History  . Not on file.   Social History Main Topics  . Smoking status: Never Smoker   . Smokeless tobacco: Never Used  . Alcohol Use: No  . Drug Use: No  . Sexual Activity: No     Comment: [place]   Other Topics Concern  . Not on file   Social History Narrative    ROS: Negative, with the exception of above mentioned in HPI  Objective: BP 131/87 mmHg  Pulse 78  Temp(Src) 98.4 F (36.9 C)  Resp 20  Ht '5\' 9"'  (1.753 m)  Wt 196 lb 8 oz (89.132 kg)  BMI 29.00 kg/m2  SpO2 96%  LMP 06/25/2015 Gen: Afebrile. No acute distress. Nontoxic in appearance, well-developed, well-nourished, female, pleasant HENT: AT. [place]. Bilateral TM visualized and normal in appearance, normal external auditory canal. MMM, no oral lesions. No Cough on exam, No hoarseness on exam. Eyes:Pupils Equal Round Reactive to light, Extraocular movements intact,  Conjunctiva without redness, discharge or icterus. CV: RRR no m/c/g/r, No edema, +2/4 P posterior tibialis pulses.  Chest: CTAB, no wheeze, rhonchi or crackles.  Abd: Soft. NTND. BS present Skin: No rashes, purpura or petechiae. Warm and well-perfused. Skin intact. Neuro/Msk: Normal gait. PERLA. EOMi. Alert. Oriented  x3.   Psych: Normal affect, dress and demeanor. Normal speech. Normal thought content and judgment.  Assessment/plan: Dawn Scott is a 45 y.o. female present for establishment of care with new provider, within the [facility] system. Depression/insomnia - Patient will continue to follow with therapist in psychiatry for all medications concerning depression and insomnia.  Endometriosis - Patient will continue to follow yearly with gynecology. I have asked her to have her records for to me her Pap/mammograms.  Health maintenance: All patient's health maintenance is up-to-date, with the exception of her HIV screening. Will await records from gynecology to see if this has been completed prior. If not we'll offer at the next appointment.   Return in about 5 months (around 11/15/2015) for CPE. > 25 minutes spent with patient, >50% of time spent face to face counseling patient and coordinating care.  Electronically signed by: Howard Pouch, DO [place]

## 2015-06-28 NOTE — Patient Instructions (Signed)
It was a pleasure meeting you today! Physical due July 20th or after. If you need anything sooner, do no hesitate to call in.

## 2015-12-21 ENCOUNTER — Encounter: Payer: Self-pay | Admitting: Family Medicine

## 2016-01-30 ENCOUNTER — Encounter: Payer: Self-pay | Admitting: Family Medicine

## 2016-02-18 ENCOUNTER — Encounter: Payer: Self-pay | Admitting: Family Medicine

## 2016-07-07 ENCOUNTER — Other Ambulatory Visit: Payer: Self-pay | Admitting: Family Medicine

## 2016-07-07 ENCOUNTER — Ambulatory Visit (INDEPENDENT_AMBULATORY_CARE_PROVIDER_SITE_OTHER): Payer: 59 | Admitting: Family Medicine

## 2016-07-07 ENCOUNTER — Encounter: Payer: Self-pay | Admitting: Family Medicine

## 2016-07-07 VITALS — BP 109/73 | HR 81 | Temp 99.2°F | Resp 20 | Wt 196.0 lb

## 2016-07-07 DIAGNOSIS — K921 Melena: Secondary | ICD-10-CM | POA: Diagnosis not present

## 2016-07-07 DIAGNOSIS — R1032 Left lower quadrant pain: Secondary | ICD-10-CM | POA: Diagnosis not present

## 2016-07-07 HISTORY — DX: Melena: K92.1

## 2016-07-07 LAB — CBC WITH DIFFERENTIAL/PLATELET
Basophils Absolute: 50 cells/uL (ref 0–200)
Basophils Relative: 1 %
Eosinophils Absolute: 100 cells/uL (ref 15–500)
Eosinophils Relative: 2 %
HEMATOCRIT: 40.5 % (ref 35.0–45.0)
HEMOGLOBIN: 13.5 g/dL (ref 11.7–15.5)
LYMPHS ABS: 1500 {cells}/uL (ref 850–3900)
Lymphocytes Relative: 30 %
MCH: 29.6 pg (ref 27.0–33.0)
MCHC: 33.3 g/dL (ref 32.0–36.0)
MCV: 88.8 fL (ref 80.0–100.0)
MONO ABS: 300 {cells}/uL (ref 200–950)
MPV: 10.8 fL (ref 7.5–12.5)
Monocytes Relative: 6 %
NEUTROS PCT: 61 %
Neutro Abs: 3050 cells/uL (ref 1500–7800)
Platelets: 304 10*3/uL (ref 140–400)
RBC: 4.56 MIL/uL (ref 3.80–5.10)
RDW: 13.6 % (ref 11.0–15.0)
WBC: 5 10*3/uL (ref 3.8–10.8)

## 2016-07-07 LAB — POC HEMOCCULT BLD/STL (OFFICE/1-CARD/DIAGNOSTIC): FECAL OCCULT BLD: NEGATIVE

## 2016-07-07 MED ORDER — METRONIDAZOLE 500 MG PO TABS: 500.0000 mg | ORAL_TABLET | Freq: Three times a day (TID) | ORAL | 0 refills | Status: DC

## 2016-07-07 MED ORDER — CIPROFLOXACIN HCL 500 MG PO TABS: 500.0000 mg | ORAL_TABLET | Freq: Two times a day (BID) | ORAL | 0 refills | Status: DC

## 2016-07-07 NOTE — Progress Notes (Signed)
Dawn Scott , [birth year], 46 y.o., female MRN: [id number] Patient Care Team    Relationship Specialty Notifications Start End  Ma Hillock, DO PCP - General Family Medicine  06/28/15   Princess Bruins, MD Consulting Physician Obstetrics and Gynecology  06/28/15   Purnell Shoemaker., MD Attending Physician Psychiatry  06/28/15     CC: LLQ pain Subjective: Pt presents for an  OV with complaints of LLQ pain  of 5 days duration.  Associated symptoms include bloody stools and low grade fever. She has never had similar symptoms in the past. She has never had a colonoscopy. CT abd 2009 did not have evidence of diverticulosis. She reports a constant low grade fever of about 99. She experienced a sharp pain in her LLQ on Thursday. She did not think much of it because she has had ovarian cyst in the past and it felt similar. The following day she experienced BRBPR on her toilet tissue and in the toilet bowel after defecation. She has since had watery diarrhea and loose stool intermittently.  She denies personal or Fhx of IBS or colon cancer. She is tolerating PO, but states that food triggers her diarrhea. She has not lost weight over this time.   Depression screen PHQ 2/9 06/28/2015  Decreased Interest 1  Down, Depressed, Hopeless 0  PHQ - 2 Score 1    No Known Allergies Social History  Substance Use Topics  . Smoking status: Never Smoker  . Smokeless tobacco: Never Used  . Alcohol use No   Past Medical History:  Diagnosis Date  . Allergy   . Anxiety   . Chicken pox   . Depression   . Endometriosis    followed by GYN   Past Surgical History:  Procedure Laterality Date  . ADENOIDECTOMY    . APPENDECTOMY    . DILATION AND CURETTAGE OF UTERUS  2004  . ECTOPIC PREGNANCY SURGERY    . MOLE REMOVAL    . SEPTOPLASTY    . WISDOM TOOTH EXTRACTION     Family History  Problem Relation Age of Onset  . Depression Mother   . Arthritis Mother   . Heart disease Mother   . Transient ischemic  attack Mother   . Alcohol abuse Father   . Depression Father   . Diabetes Father   . Alcohol abuse Maternal Grandfather   . Leukemia Maternal Grandfather   . Early death Maternal Grandfather     leukemia  . Breast cancer Maternal Aunt   . Early death Maternal Aunt     cancer  . Depression Paternal Uncle   . Transient ischemic attack Maternal Grandmother   . Arthritis Paternal Grandmother   . Dementia Paternal Grandmother   . Alcohol abuse Paternal Grandfather   . Early death Paternal Grandfather    Allergies as of 07/07/2016   No Known Allergies     Medication List       Accurate as of 07/07/16  9:49 AM. Always use your most recent med list.          DULoxetine 30 MG capsule Commonly known as:  CYMBALTA Take 30 mg by mouth daily.   DULoxetine 60 MG capsule Commonly known as:  CYMBALTA 60 mg. Patient takes a total of 90 mg daily   lamoTRIgine 150 MG tablet Commonly known as:  LAMICTAL   loratadine 10 MG tablet Commonly known as:  CLARITIN Take 10 mg by mouth daily as needed.   multivitamin with  minerals tablet Take 1 tablet by mouth daily.   WELLBUTRIN XL 150 MG 24 hr tablet Generic drug:  buPROPion Take 300 mg by mouth daily.       No results found for this or any previous visit (from the past 24 hour(s)). No results found.   ROS: Negative, with the exception of above mentioned in HPI   Objective:  BP 109/73 (BP Location: Right Arm, Patient Position: Sitting, Cuff Size: Large)   Pulse 81   Temp 99.2 F (37.3 C)   Resp 20   Wt 196 lb (88.9 kg)   SpO2 99%   BMI 28.94 kg/m  Body mass index is 28.94 kg/m. Gen: febrile. No acute distress. Nontoxic in appearance, well developed, well nourished.  HENT: AT. [place]. MMM, no oral lesions.  Eyes:Pupils Equal Round Reactive to light, Extraocular movements intact,  Conjunctiva without redness, discharge or icterus. CV: RRR  Chest: CTAB, no wheeze or crackles. Good air movement, normal resp effort.  Abd:  Soft. flat. Mild LLQ discomfort. ND. BS present. no Masses palpated. No rebound or guarding. No peritoneal signs.  Neuro: Normal gait. PERLA. EOMi. Alert. Oriented x3  FOBT: negative.   Assessment/Plan: Dawn Scott is a 46 y.o. female present for OV for LLQ pain.   LLQ pain/bloody stools:  -  Pt is only experience mild discomfort today on exam. FOBT was negative. Discussed options with her and potential DDX colitis vs diverticulitis.  - clear liquid diet advance slowly to bland/soft. Start cipro/flagyl.  - if pain worsens or becomes more febrile would want to obtain CT imaging. Pt agreed with plan and would like to avoid CT unless worsening.  - CBC w/Diff - Basic Metabolic Panel (BMET) - Sed Rate (ESR) - C-reactive protein - ciprofloxacin (CIPRO) 500 MG tablet; Take 1 tablet (500 mg total) by mouth 2 (two) times daily.  Dispense: 14 tablet; Refill: 0 - metroNIDAZOLE (FLAGYL) 500 MG tablet; Take 1 tablet (500 mg total) by mouth 3 (three) times daily.  Dispense: 21 tablet; Refill: 0 - F/U 1 week if any residual discomfort/bleeding per rectum, sooner if worsening.   Reviewed expectations re: course of current medical issues.  Discussed self-management of symptoms.  Outlined signs and symptoms indicating need for more acute intervention.  Patient verbalized understanding and all questions were answered.  Patient received an After-Visit Summary.   electronically signed by:  Howard Pouch, DO  [place]

## 2016-07-07 NOTE — Patient Instructions (Addendum)
Diverticulitis Diverticulitis is when small pockets in your large intestine (colon) get infected or swollen. This causes stomach pain and watery poop (diarrhea). These pouches are called diverticula. They form in people who have a condition called diverticulosis. Follow these instructions at home: Medicines   Take over-the-counter and prescription medicines only as told by your doctor. These include:  Antibiotics.  Pain medicines.  Fiber pills.  Probiotics.  Stool softeners.  Do not drive or use heavy machinery while taking prescription pain medicine.  If you were prescribed an antibiotic, take it as told. Do not stop taking it even if you feel better. General instructions   Follow a diet as told by your doctor.  When you feel better, your doctor may tell you to change your diet. You may need to eat a lot of fiber. Fiber makes it easier to poop (have bowel movements). Healthy foods with fiber include:  Berries.  Beans.  Lentils.  Green vegetables.  Exercise 3 or more times a week. Aim for 30 minutes each time. Exercise enough to sweat and make your heart beat faster.  Keep all follow-up visits as told. This is important. You may need to have an exam of the large intestine. This is called a colonoscopy. Contact a doctor if:  Your pain does not get better.  You have a hard time eating or drinking.  You are not pooping like normal. Get help right away if:  Your pain gets worse.  Your problems do not get better.  Your problems get worse very fast.  You have a fever.  You throw up (vomit) more than one time.  You have poop that is:  Bloody.  Black.  Tarry. Summary  Diverticulitis is when small pockets in your large intestine (colon) get infected or swollen.  Take medicines only as told by your doctor.  Follow a diet as told by your doctor. This information is not intended to replace advice given to you by your health care provider. Make sure you  discuss any questions you have with your health care provider. Document Released: 10/01/2007 Document Revised: 05/01/2016 Document Reviewed: 05/01/2016 Elsevier Interactive Patient Education  2017 Else[employer] Inc. Colitis Colitis is inflammation of the colon. Colitis may last a short time (acute) or it may last a long time (chronic). What are the causes? This condition may be caused by:  Viruses.  Bacteria.  Reactions to medicine.  Certain autoimmune diseases, such as Crohn disease or ulcerative colitis. What are the signs or symptoms? Symptoms of this condition include:  Diarrhea.  Passing bloody or tarry stool.  Pain.  Fever.  Vomiting.  Tiredness (fatigue).  Weight loss.  Bloating.  Sudden increase in abdominal pain.  Having fewer bowel movements than usual. How is this diagnosed? This condition is diagnosed with a stool test or a blood test. You may also have other tests, including X-rays, a CT scan, or a colonoscopy. How is this treated? Treatment may include:  Resting the bowel. This involves not eating or drinking for a period of time.  Fluids that are given through an IV tube.  Medicine for pain and diarrhea.  Antibiotic medicines.  Cortisone medicines.  Surgery. Follow these instructions at home: Eating and drinking   Follow instructions from your health care provider about eating or drinking restrictions.  Drink enough fluid to keep your urine clear or pale yellow.  Work with a dietitian to determine which foods cause your condition to flare up.  Avoid foods that cause flare-ups.  Eat a well-balanced diet. Medicines   Take over-the-counter and prescription medicines only as told by your health care provider.  If you were prescribed an antibiotic medicine, take it as told by your health care provider. Do not stop taking the antibiotic even if you start to feel better. General instructions   Keep all follow-up visits as told by your health  care provider. This is important. Contact a health care provider if:  Your symptoms do not go away.  You develop new symptoms. Get help right away if:  You have a fever that does not go away with treatment.  You develop chills.  You have extreme weakness, fainting, or dehydration.  You have repeated vomiting.  You develop severe pain in your abdomen.  You pass bloody or tarry stool. This information is not intended to replace advice given to you by your health care provider. Make sure you discuss any questions you have with your health care provider. Document Released: 05/22/2004 Document Revised: 09/20/2015 Document Reviewed: 08/07/2014 Elsevier Interactive Patient Education  2017 [employer].    We will try to antibiotics for 7 days. Liquid diet for 24-48 hours, slowly advance to bland soft diet then add regular diet slowly thereafter.  If labs abnormal or pain returns/unresolved would then need to obtain Image by CT.

## 2016-07-07 NOTE — Addendum Note (Signed)
Addended by: Ralph Dowdy on: 07/07/2016 11:13 AM   Modules accepted: Orders

## 2016-07-08 ENCOUNTER — Telehealth: Payer: Self-pay | Admitting: Family Medicine

## 2016-07-08 ENCOUNTER — Encounter: Payer: Self-pay | Admitting: *Deleted

## 2016-07-08 LAB — BASIC METABOLIC PANEL WITH GFR
BUN: 7 mg/dL (ref 7–25)
CHLORIDE: 104 mmol/L (ref 98–110)
CO2: 24 mmol/L (ref 20–31)
CREATININE: 0.86 mg/dL (ref 0.50–1.10)
Calcium: 9.4 mg/dL (ref 8.6–10.2)
GFR, Est African American: >89 mL/min (ref >=60)
GFR, Est Non African American: 82 mL/min (ref >=60)
Glucose, Bld: 75 mg/dL (ref 65–99)
POTASSIUM: 4.2 mmol/L (ref 3.5–5.3)
SODIUM: 136 mmol/L (ref 135–146)

## 2016-07-08 LAB — C-REACTIVE PROTEIN: CRP: 1.2 mg/L (ref <8.0)

## 2016-07-08 LAB — SEDIMENTATION RATE: Sed Rate: 11 mm/hr (ref 0–20)

## 2016-07-08 NOTE — Telephone Encounter (Signed)
Left message with results for patient and sent message in [place].

## 2016-07-08 NOTE — Telephone Encounter (Signed)
Please call pt - all her labs are normal.  - continue abx, if pain worsens or does not resolve follow up.

## 2016-07-08 NOTE — Telephone Encounter (Signed)
Patient called to state that it was okay to leave a detailed message on mobile phone if she does not answer once CMA calls when lab results are ready.

## 2016-07-14 ENCOUNTER — Ambulatory Visit: Payer: Self-pay | Admitting: Family Medicine

## 2016-07-20 ENCOUNTER — Emergency Department (HOSPITAL_COMMUNITY)
Admission: EM | Admit: 2016-07-20 | Discharge: 2016-07-20 | Disposition: A | Discharge status: Home / Self Care | Payer: 59 | Attending: Emergency Medicine | Admitting: Emergency Medicine

## 2016-07-20 ENCOUNTER — Encounter (HOSPITAL_COMMUNITY): Payer: Self-pay | Admitting: Emergency Medicine

## 2016-07-20 DIAGNOSIS — R42 Dizziness and giddiness: Secondary | ICD-10-CM | POA: Diagnosis not present

## 2016-07-20 DIAGNOSIS — R197 Diarrhea, unspecified: Secondary | ICD-10-CM

## 2016-07-20 DIAGNOSIS — D72829 Elevated white blood cell count, unspecified: Secondary | ICD-10-CM | POA: Diagnosis not present

## 2016-07-20 LAB — COMPREHENSIVE METABOLIC PANEL
ALT: 9 U/L — AB (ref 14–54)
ANION GAP: 8 (ref 5–15)
AST: 21 U/L (ref 15–41)
Albumin: 3.8 g/dL (ref 3.5–5.0)
Alkaline Phosphatase: 46 U/L (ref 38–126)
BUN: 13 mg/dL (ref 6–20)
CO2: 24 mmol/L (ref 22–32)
Calcium: 8.7 mg/dL — ABNORMAL LOW (ref 8.9–10.3)
Chloride: 103 mmol/L (ref 101–111)
Creatinine, Ser: 0.74 mg/dL (ref 0.44–1.00)
GFR calc non Af Amer: >60 mL/min (ref >60)
Glucose, Bld: 116 mg/dL — ABNORMAL HIGH (ref 65–99)
Potassium: 3.6 mmol/L (ref 3.5–5.1)
Sodium: 135 mmol/L (ref 135–145)
Total Bilirubin: 0.6 mg/dL (ref 0.3–1.2)
Total Protein: 6.6 g/dL (ref 6.5–8.1)

## 2016-07-20 LAB — URINALYSIS, ROUTINE W REFLEX MICROSCOPIC
Bilirubin Urine: NEGATIVE
GLUCOSE, UA: NEGATIVE mg/dL
Hgb urine dipstick: NEGATIVE
KETONES UR: NEGATIVE mg/dL
Nitrite: NEGATIVE
Protein, ur: NEGATIVE mg/dL
Specific Gravity, Urine: 1.017 (ref 1.005–1.030)
pH: 6 (ref 5.0–8.0)

## 2016-07-20 LAB — CBC
HCT: 35.9 % — ABNORMAL LOW (ref 36.0–46.0)
Hemoglobin: 12.1 g/dL (ref 12.0–15.0)
MCH: 28.9 pg (ref 26.0–34.0)
MCHC: 33.7 g/dL (ref 30.0–36.0)
MCV: 85.9 fL (ref 78.0–100.0)
Platelets: 281 10*3/uL (ref 150–400)
RBC: 4.18 MIL/uL (ref 3.87–5.11)
RDW: 13 % (ref 11.5–15.5)
WBC: 19.2 10*3/uL — ABNORMAL HIGH (ref 4.0–10.5)

## 2016-07-20 LAB — C DIFFICILE QUICK SCREEN W PCR REFLEX
C DIFFICLE (CDIFF) ANTIGEN: POSITIVE — AB
C Diff interpretation: DETECTED
C Diff toxin: POSITIVE — AB

## 2016-07-20 LAB — POC URINE PREG, ED: Preg Test, Ur: NEGATIVE

## 2016-07-20 LAB — MAGNESIUM: MAGNESIUM: 1.6 mg/dL — AB (ref 1.7–2.4)

## 2016-07-20 MED ORDER — MAGNESIUM OXIDE 400 (241.3 MG) MG PO TABS
800.0000 mg | ORAL_TABLET | Freq: Once | ORAL | Status: AC
  Administered 2016-07-20: 800 mg via ORAL
  Filled 2016-07-20 (×3): qty 2

## 2016-07-20 MED ORDER — SODIUM CHLORIDE 0.9 % IV BOLUS (SEPSIS)
1000.0000 mL | Freq: Once | INTRAVENOUS | Status: AC
  Administered 2016-07-20: 1000 mL via INTRAVENOUS

## 2016-07-20 NOTE — ED Triage Notes (Signed)
Patient c/o diarrhea and nausea since this morning. Denies nausea. Reports abdominal cramping. States recent hx colitis. Denies chest pain and SOB.

## 2016-07-20 NOTE — ED Notes (Signed)
Pt is waiting on med from pharmacy

## 2016-07-20 NOTE — ED Notes (Signed)
Waiting for meds from pharmacy. 

## 2016-07-20 NOTE — ED Notes (Signed)
Lab called after pt was d/c and reports that pt is + for cdiff. Dr Kathrynn Humble notified and he sts he will contact pt

## 2016-07-20 NOTE — ED Notes (Signed)
Patient is aware of the need for urine and stool speciman

## 2016-07-20 NOTE — Discharge Instructions (Signed)
Please return to the ER if your symptoms worsen; you have increased pain, fevers, chills, bloody stools, inability to keep any medications down, confusion. Otherwise see the outpatient doctor as requested.  We have emailed your doctor to ensure they will follow up on your stool studies.

## 2016-07-20 NOTE — ED Notes (Signed)
ED Provider at bedside. 

## 2016-07-20 NOTE — ED Provider Notes (Signed)
[facility] DEPT Provider Note   CSN: [id number] Arrival date & time: 07/20/16  1141     History   Chief Complaint Chief Complaint  Patient presents with  . Diarrhea  . Nausea    HPI Dawn Scott is a 46 y.o. female.  HPI Pt comes in with cc of diarrhea. Patient started having diarrhea this morning, and since then she has had multiple rounds of loose, watery stools. No blood. Pt has nausea, no emesis. Pt has some abdominal cramping pain in the lower quadrants. There is no dysuria, polyuria. Pt doesn't suspect food as the source of her symptoms.   Past Medical History:  Diagnosis Date  . Allergy   . Anxiety   . Chicken pox   . Depression   . Endometriosis    followed by GYN    Patient Active Problem List   Diagnosis Date Noted  . Bloody stool 07/07/2016  . LLQ pain 07/07/2016  . Endometriosis 06/28/2015  . Vitamin D deficiency 06/28/2015  . Health care maintenance 06/28/2015  . Depression 06/16/2012  . Insomnia 06/12/2012    Past Surgical History:  Procedure Laterality Date  . ADENOIDECTOMY    . APPENDECTOMY    . DILATION AND CURETTAGE OF UTERUS  2004  . ECTOPIC PREGNANCY SURGERY    . MOLE REMOVAL    . SEPTOPLASTY    . WISDOM TOOTH EXTRACTION      OB History    No data available       Home Medications    Prior to Admission medications   Medication Sig Start Date End Date Taking? Authorizing Provider  buPROPion (WELLBUTRIN XL) 150 MG 24 hr tablet Take 300 mg by mouth daily. 08/23/12  Yes Kennyth Arnold, FNP  Cyanocobalamin (B-12 PO) Take 1 tablet by mouth daily.   Yes Historical Provider, MD  DULoxetine (CYMBALTA) 30 MG capsule Take 30 mg by mouth daily.  09/25/12  Yes Historical Provider, MD  DULoxetine (CYMBALTA) 60 MG capsule 60 mg. Patient takes a total of 90 mg daily 04/02/15  Yes Historical Provider, MD  lamoTRIgine (LAMICTAL) 150 MG tablet  11/06/14  Yes Historical Provider, MD  loratadine (CLARITIN) 10 MG tablet Take 10 mg by mouth daily as  needed.    Yes Historical Provider, MD  Multiple Vitamins-Minerals (MULTIVITAMIN WITH MINERALS) tablet Take 1 tablet by mouth daily.   Yes Historical Provider, MD  ciprofloxacin (CIPRO) 500 MG tablet Take 1 tablet (500 mg total) by mouth 2 (two) times daily. Patient not taking: Reported on 07/20/2016 07/07/16   Renee A Kuneff, DO  metroNIDAZOLE (FLAGYL) 500 MG tablet Take 1 tablet (500 mg total) by mouth 3 (three) times daily. Patient not taking: Reported on 07/20/2016 07/07/16   Ma Hillock, DO    Family History Family History  Problem Relation Age of Onset  . Depression Mother   . Arthritis Mother   . Heart disease Mother   . Transient ischemic attack Mother   . Alcohol abuse Father   . Depression Father   . Diabetes Father   . Alcohol abuse Maternal Grandfather   . Leukemia Maternal Grandfather   . Early death Maternal Grandfather     leukemia  . Breast cancer Maternal Aunt   . Early death Maternal Aunt     cancer  . Depression Paternal Uncle   . Transient ischemic attack Maternal Grandmother   . Arthritis Paternal Grandmother   . Dementia Paternal Grandmother   . Alcohol abuse Paternal Grandfather   .  Early death Paternal Grandfather     Social History Social History  Substance Use Topics  . Smoking status: Never Smoker  . Smokeless tobacco: Never Used  . Alcohol use No     Allergies   Patient has no known allergies.   Review of Systems Review of Systems  ROS 10 Systems reviewed and are negative for acute change except as noted in the HPI.     Physical Exam Updated Vital Signs BP 106/66   Pulse 83   Temp 97.8 F (36.6 C) (Oral)   Resp 16   Ht 5\' 10"  (1.778 m)   Wt 195 lb (88.5 kg)   LMP 07/06/2016   SpO2 100%   BMI 27.98 kg/m   Physical Exam  Constitutional: She is oriented to person, place, and time. She appears well-developed and well-nourished.  HENT:  Head: Normocephalic and atraumatic.  Eyes: EOM are normal. Pupils are equal, round, and  reactive to light.  Neck: Neck supple.  Cardiovascular: Normal rate, regular rhythm and normal heart sounds.   No murmur heard. Pulmonary/Chest: Effort normal. No respiratory distress.  Abdominal: Soft. She exhibits no distension. There is no tenderness. There is no rebound and no guarding.  Neurological: She is alert and oriented to person, place, and time.  Skin: Skin is warm and dry.  Nursing note and vitals reviewed.    ED Treatments / Results  Labs (all labs ordered are listed, but only abnormal results are displayed) Labs Reviewed  C DIFFICILE QUICK SCREEN W PCR REFLEX - Abnormal; Notable for the following:       Result Value   C Diff antigen POSITIVE (*)    C Diff toxin POSITIVE (*)    All other components within normal limits  COMPREHENSIVE METABOLIC PANEL - Abnormal; Notable for the following:    Glucose, Bld 116 (*)    Calcium 8.7 (*)    ALT 9 (*)    All other components within normal limits  CBC - Abnormal; Notable for the following:    WBC 19.2 (*)    HCT 35.9 (*)    All other components within normal limits  URINALYSIS, ROUTINE W REFLEX MICROSCOPIC - Abnormal; Notable for the following:    APPearance HAZY (*)    Leukocytes, UA LARGE (*)    Bacteria, UA RARE (*)    Squamous Epithelial / LPF 0-5 (*)    All other components within normal limits  MAGNESIUM - Abnormal; Notable for the following:    Magnesium 1.6 (*)    All other components within normal limits  POC URINE PREG, ED    EKG  EKG Interpretation None       Radiology No results found.  Procedures Procedures (including critical care time)  Medications Ordered in ED Medications  sodium chloride 0.9 % bolus 1,000 mL (0 mLs Intravenous Stopped 07/20/16 1415)  magnesium oxide (MAG-OX) tablet 800 mg (800 mg Oral Given 07/20/16 1650)     Initial Impression / Assessment and Plan / ED Course  I have reviewed the triage vital signs and the nursing notes.  Pertinent labs & imaging results that  were available during my care of the patient were reviewed by me and considered in my medical decision making (see chart for details).  Clinical Course as of Jul 20 1728  Sun Jul 20, 2016  1726 Cdiff is +. I called Dawn Scott and shared the results. She asked me to call in a prescription to [facility] on [address], which  I did.  Pt has severe Cdiff based on criteria of WC > 15K, so vanc 125 mg qid x 10 days ordered.  Strict ER return precautions have been discussed, and patient is agreeing with the plan and is comfortable with the workup done and the recommendations from the ER.  C Diff antigen: (!) POSITIVE [AN]    Clinical Course User Index [AN] Varney Biles, MD    Pt comes in with diarrhea. Recent use of cipro for diverticulitis increases the suspicion for c-diff colitis. Pt's WC is fairly elevated -which increases the suspicion even more.  Cdiff study ordered.  Pt is having dizziness -she is orthostatic +. Hydration started here.  Pt wants to go home rather than wait for results. I have emailed the PCP to f/u the results.   Final Clinical Impressions(s) / ED Diagnoses   Final diagnoses:  Diarrhea of presumed infectious origin  Leukocytosis, unspecified type  Hypomagnesemia  Orthostatic dizziness    New Prescriptions Discharge Medication List as of 07/20/2016  4:22 PM       Varney Biles, MD 07/20/16 1730

## 2016-07-21 ENCOUNTER — Telehealth: Payer: Self-pay | Admitting: *Deleted

## 2016-07-21 NOTE — Telephone Encounter (Signed)
-----   Message from Varney Biles, MD sent at 07/20/2016  5:06 PM EDT ----- Regarding: stool study follow up Hello Dr. Raoul Pitch and Ms. Riki Sheer,  Ms. Stanbrough came in with diarrhea. She has elevated white count and a recent cipro prescription. C-diff study ordered. Pt wanted to go home sooner than the results would have been available - so will you kindly follow up with the results.  We didn't screen for any of the other pathogens or start patient on any antibiotics - so if cdiff is negative, and your suspicion is high for bacterial cause, please advise patient for a close follow up.  Thank you for your great care.  Sincerely,  [facility]

## 2016-07-21 NOTE — Telephone Encounter (Signed)
-----   Message from Leota Jacobsen, LPN sent at 7/57/9728  8:27 AM EDT ----- Regarding: FW: stool study follow up Patient positive for C-Diff not treated in ED do you want to treat? ----- Message ----- From: Varney Biles, MD Sent: 07/20/2016   5:06 PM To: Ralph Dowdy, CMA, Ma Hillock, DO Subject: stool study follow up                          Hello Dr. Raoul Pitch and Ms. Riki Sheer,  Ms. Selvy came in with diarrhea. She has elevated white count and a recent cipro prescription. C-diff study ordered. Pt wanted to go home sooner than the results would have been available - so will you kindly follow up with the results.  We didn't screen for any of the other pathogens or start patient on any antibiotics - so if cdiff is negative, and your suspicion is high for bacterial cause, please advise patient for a close follow up.  Thank you for your great care.  Sincerely,  [facility]

## 2016-07-21 NOTE — Telephone Encounter (Signed)
Spoke with patient she states ED doctor called in antibiotics for her C Diff. She will follow up as needed.

## 2016-07-21 NOTE — Telephone Encounter (Signed)
Update: the EDP ended up following up with this pt after all b/c her C diff returned positive. The EDP sent in an antibiotic for her already.  Signed:  Crissie Sickles, MD           07/21/2016

## 2016-08-20 ENCOUNTER — Ambulatory Visit (INDEPENDENT_AMBULATORY_CARE_PROVIDER_SITE_OTHER): Payer: 59 | Admitting: Obstetrics & Gynecology

## 2016-08-20 ENCOUNTER — Encounter: Payer: Self-pay | Admitting: Obstetrics & Gynecology

## 2016-08-20 VITALS — BP 116/74 | Ht 69.0 in | Wt 200.0 lb

## 2016-08-20 DIAGNOSIS — Z975 Presence of (intrauterine) contraceptive device: Secondary | ICD-10-CM

## 2016-08-20 DIAGNOSIS — Z01419 Encounter for gynecological examination (general) (routine) without abnormal findings: Secondary | ICD-10-CM | POA: Diagnosis not present

## 2016-08-20 DIAGNOSIS — Z1151 Encounter for screening for human papillomavirus (HPV): Secondary | ICD-10-CM

## 2016-08-20 DIAGNOSIS — N945 Secondary dysmenorrhea: Secondary | ICD-10-CM

## 2016-08-20 NOTE — Patient Instructions (Addendum)
Your Annual/Gyn exam was normal today.  A Pap/HR HPV was done.  Given your heavy and painful menses, we discussed proceeding with a Novasure Endometrial Ablation.  We will do a Pelvic US next visit to make sure your Endometrial lining is normal.  Given that the IUD will need to be removed before the Ablation, we could proceed with a Tubal ligation at the same time.   It was a pleasure to see you today Dawn Scott, I'll see you soon for the Pelvic US.   Laparoscopic Tubal Ligation Laparoscopic tubal ligation is a procedure to close the fallopian tubes. This is done so that you cannot get pregnant. When the fallopian tubes are closed, the eggs that your ovaries release cannot enter the uterus, and sperm cannot reach the released eggs. A laparoscopic tubal ligation is sometimes called "getting your tubes tied." You should not have this procedure if you want to get pregnant someday or if you are unsure about having more children. Tell a health care provider about:  Any allergies you have.  All medicines you are taking, including vitamins, herbs, eye drops, creams, and over-the-counter medicines.  Any problems you or family members have had with anesthetic medicines.  Any blood disorders you have.  Any surgeries you have had.  Any medical conditions you have.  Whether you are pregnant or may be pregnant.  Any past pregnancies. What are the risks? Generally, this is a safe procedure. However, problems may occur, including:  Infection.  Bleeding.  Injury to surrounding organs.  Side effects from anesthetics.  Failure of the procedure. This procedure can increase your risk of a kind of pregnancy in which a fertilized egg attaches to the outside of the uterus (ectopic pregnancy). What happens before the procedure?  Ask your health care provider about:  Changing or stopping your regular medicines. This is especially important if you are taking diabetes medicines or blood thinners.  Taking  medicines such as aspirin and ibuprofen. These medicines can thin your blood. Do not take these medicines before your procedure if your health care provider instructs you not to.  Follow instructions from your health care provider about eating and drinking restrictions.  Plan to have someone take you home after the procedure.  If you go home right after the procedure, plan to have someone with you for 24 hours. What happens during the procedure?  You will be given one or more of the following:  A medicine to help you relax (sedative).  A medicine to numb the area (local anesthetic).  A medicine to make you fall asleep (general anesthetic).  A medicine that is injected into an area of your body to numb everything below the injection site (regional anesthetic).  An IV tube will be inserted into one of your veins. It will be used to give you medicines and fluids during the procedure.  Your bladder may be emptied with a small tube (catheter).  If you have been given a general anesthetic, a tube will be put down your throat to help you breathe.  Two small cuts (incisions) will be made in your lower abdomen and near your belly button.  Your abdomen will be inflated with a gas. This will let the surgeon see better and will give the surgeon room to work.  A thin, lighted tube (laparoscope) with a camera attached will be inserted into your abdomen through one of the incisions. Small instruments will be inserted through the other incision.  The fallopian tubes will be  tied off, burned (cauterized), or blocked with a clip, ring, or clamp. A small portion in the center of each fallopian tube may be removed.  The gas will be released from the abdomen.  The incisions will be closed with stitches (sutures).  A bandage (dressing) will be placed over the incisions. The procedure may vary among health care providers and hospitals. What happens after the procedure?  Your blood pressure, heart  rate, breathing rate, and blood oxygen level will be monitored often until the medicines you were given have worn off.  You will be given medicine to help with pain, nausea, and vomiting as needed. This information is not intended to replace advice given to you by your health care provider. Make sure you discuss any questions you have with your health care provider. Document Released: 07/21/2000 Document Revised: 09/20/2015 Document Reviewed: 03/25/2015 Elsevier Interactive Patient Education  2017 [employer].  Endometrial Ablation Endometrial ablation is a procedure that destroys the thin inner layer of the lining of the uterus (endometrium). This procedure may be done:  To stop heavy periods.  To stop bleeding that is causing anemia.  To control irregular bleeding.  To treat bleeding caused by small tumors (fibroids) in the endometrium. This procedure is often an alternative to major surgery, such as removal of the uterus and cervix (hysterectomy). As a result of this procedure:  You may not be able to have children. However, if you are premenopausal (you have not gone through menopause):  You may still have a small chance of getting pregnant.  You will need to use a reliable method of birth control after the procedure to prevent pregnancy.  You may stop having a menstrual period, or you may have only a small amount of bleeding during your period. Menstruation may return several years after the procedure. Tell a health care provider about:  Any allergies you have.  All medicines you are taking, including vitamins, herbs, eye drops, creams, and over-the-counter medicines.  Any problems you or family members have had with the use of anesthetic medicines.  Any blood disorders you have.  Any surgeries you have had.  Any medical conditions you have. What are the risks? Generally, this is a safe procedure. However, problems may occur, including:  A hole (perforation) in the  uterus or bowel.  Infection of the uterus, bladder, or vagina.  Bleeding.  Damage to other structures or organs.  An air bubble in the lung (air embolus).  Problems with pregnancy after the procedure.  Failure of the procedure.  Decreased ability to diagnose cancer in the endometrium. What happens before the procedure?  You will have tests of your endometrium to make sure there are no pre-cancerous cells or cancer cells present.  You may have an ultrasound of the uterus.  You may be given medicines to thin the endometrium.  Ask your health care provider about:  Changing or stopping your regular medicines. This is especially important if you take diabetes medicines or blood thinners.  Taking medicines such as aspirin and ibuprofen. These medicines can thin your blood. Do not take these medicines before your procedure if your doctor tells you not to.  Plan to have someone take you home from the hospital or clinic. What happens during the procedure?  You will lie on an exam table with your feet and legs supported as in a pelvic exam.  To lower your risk of infection:  Your health care team will wash or sanitize their hands and put  on germ-free (sterile) gloves.  Your genital area will be washed with soap.  An IV tube will be inserted into one of your veins.  You will be given a medicine to help you relax (sedative).  A surgical instrument with a light and camera (resectoscope) will be inserted into your vagina and moved into your uterus. This allows your surgeon to see inside your uterus.  Endometrial tissue will be removed using one of the following methods:  Radiofrequency. This method uses a radiofrequency-alternating electric current to remove the endometrium.  Cryotherapy. This method uses extreme cold to freeze the endometrium.  Heated-free liquid. This method uses a heated saltwater (saline) solution to remove the endometrium.  Microwave. This method uses  high-energy microwaves to heat up the endometrium and remove it.  Thermal balloon. This method involves inserting a catheter with a balloon tip into the uterus. The balloon tip is filled with heated fluid to remove the endometrium. The procedure may vary among health care providers and hospitals. What happens after the procedure?  Your blood pressure, heart rate, breathing rate, and blood oxygen level will be monitored until the medicines you were given have worn off.  As tissue healing occurs, you may notice vaginal bleeding for 4-6 weeks after the procedure. You may also experience:  Cramps.  Thin, watery vaginal discharge that is light pink or brown in color.  A need to urinate more frequently than usual.  Nausea.  Do not drive for 24 hours if you were given a sedative.  Do not have sex or insert anything into your vagina until your health care provider approves. Summary  Endometrial ablation is done to treat the many causes of heavy menstrual bleeding.  The procedure may be done only after medications have been tried to control the bleeding.  Plan to have someone take you home from the hospital or clinic. This information is not intended to replace advice given to you by your health care provider. Make sure you discuss any questions you have with your health care provider. Document Released: 02/22/2004 Document Revised: 05/01/2016 Document Reviewed: 05/01/2016 Elsevier Interactive Patient Education  2017 [employer].

## 2016-08-20 NOTE — Progress Notes (Signed)
    Dawn Scott 06/06/1970 [id number]   History:    46 y.o. G2P1A1 widowed.  Daughter doing well.  Established patient presenting for annual gyn exam.  New boyfriend, sexually active.  Vasectomy.  Paragard IUD x 04/2013.  Menses heavy with severe cramping.  Desires Endometrial Ablation.  Declines any hormonal control of her menstrual cycle.  No pelvic pain outside periods.  No vaginal d/c.  Breasts wnl.  Past medical history,surgical history, family history and social history were all reviewed and documented in the EPIC chart.  Gynecologic History Patient's last menstrual period was 07/30/2016. Contraception: IUD, Paragard x 04/2013 Last Pap: 10/2015. Results were: normal/HPV HR neg Last mammogram: 02/2016. Results were: normal  Obstetric History 1 term pregnancy 1 ectopic pregnancy   ROS: A ROS was performed and pertinent positives and negatives are included in the history.  GENERAL: No fevers or chills. HEENT: No change in vision, no earache, sore throat or sinus congestion. NECK: No pain or stiffness. CARDIOVASCULAR: No chest pain or pressure. No palpitations. PULMONARY: No shortness of breath, cough or wheeze. GASTROINTESTINAL: No abdominal pain, nausea, vomiting or diarrhea, melena or bright red blood per rectum. GENITOURINARY: No urinary frequency, urgency, hesitancy or dysuria. MUSCULOSKELETAL: No joint or muscle pain, no back pain, no recent trauma. DERMATOLOGIC: No rash, no itching, no lesions. ENDOCRINE: No polyuria, polydipsia, no heat or cold intolerance. No recent change in weight. HEMATOLOGICAL: No anemia or easy bruising or bleeding. NEUROLOGIC: No headache, seizures, numbness, tingling or weakness. PSYCHIATRIC: No depression, no loss of interest in normal activity or change in sleep pattern.     Exam:   BP 116/74   Ht 5\' 9"  (1.753 m)   Wt 200 lb (90.7 kg)   LMP 07/30/2016   BMI 29.53 kg/m   Body mass index is 29.53 kg/m.  General appearance : Well developed well  nourished female. No acute distress HEENT: Eyes: no retinal hemorrhage or exudates,  Neck supple, trachea midline, no carotid bruits, no thyroidmegaly Lungs: Clear to auscultation, no rhonchi or wheezes, or rib retractions  Heart: Regular rate and rhythm, no murmurs or gallops Breast:Examined in sitting and supine position were symmetrical in appearance, no palpable masses or tenderness,  no skin retraction, no nipple inversion, no nipple discharge, no skin discoloration, no axillary or supraclavicular lymphadenopathy Abdomen: no palpable masses or tenderness, no rebound or guarding Extremities: no edema or skin discoloration or tenderness  Pelvic:  Bartholin, Urethra, Skene Glands: Within normal limits             Vagina: No gross lesions or discharge  Cervix: No gross lesions or discharge.  IUD strings seen.  Pap/HPV HR done.  Uterus  AV, normal size, shape and consistency, non-tender and mobile  Adnexa  Without masses or tenderness  Anus and perineum  normal    Assessment/Plan:  46 y.o. female for annual exam  1. Encounter for gynecological examination without abnormal finding Normal Annual/Gyn exam.  Pap/HPV HR pending.  Will schedule next Screening Mammo at [facility] 02/2017.  2. Contraception, device intrauterine Paragard IUD in good location.  But patient considering Novasure Endometrial Ablation.  Boyfriend has a Scientific laboratory technician.  May do BT/S none-the-less.  3. Secondary dysmenorrhea with heavy periods Declines hormonal control.  Decision to proceed with Novasure Endometrial Ablation.  Possibly BT/S.  Surgery and risks briefly reviewed.  Information printed with AVS.  F/U Pelvic US to evaluate the Endometrial lining Preop.   Princess Bruins MD, 3:35 PM 08/20/2016

## 2016-08-21 DIAGNOSIS — D225 Melanocytic nevi of trunk: Secondary | ICD-10-CM | POA: Diagnosis not present

## 2016-08-21 LAB — PAP IG AND HPV HIGH-RISK: HPV DNA High Risk: NOT DETECTED

## 2016-08-22 ENCOUNTER — Encounter: Payer: Self-pay | Admitting: Obstetrics & Gynecology

## 2016-08-25 ENCOUNTER — Other Ambulatory Visit: Payer: Self-pay | Admitting: Obstetrics & Gynecology

## 2016-08-25 DIAGNOSIS — N939 Abnormal uterine and vaginal bleeding, unspecified: Secondary | ICD-10-CM

## 2016-09-04 ENCOUNTER — Ambulatory Visit (INDEPENDENT_AMBULATORY_CARE_PROVIDER_SITE_OTHER): Payer: 59 | Admitting: Obstetrics & Gynecology

## 2016-09-04 ENCOUNTER — Other Ambulatory Visit: Payer: Self-pay | Admitting: Obstetrics & Gynecology

## 2016-09-04 ENCOUNTER — Encounter: Payer: Self-pay | Admitting: Obstetrics & Gynecology

## 2016-09-04 ENCOUNTER — Ambulatory Visit (INDEPENDENT_AMBULATORY_CARE_PROVIDER_SITE_OTHER): Payer: 59

## 2016-09-04 VITALS — BP 120/84 | Ht 69.0 in | Wt 200.0 lb

## 2016-09-04 DIAGNOSIS — N83202 Unspecified ovarian cyst, left side: Secondary | ICD-10-CM

## 2016-09-04 DIAGNOSIS — D251 Intramural leiomyoma of uterus: Secondary | ICD-10-CM

## 2016-09-04 DIAGNOSIS — N939 Abnormal uterine and vaginal bleeding, unspecified: Secondary | ICD-10-CM | POA: Diagnosis not present

## 2016-09-04 DIAGNOSIS — N920 Excessive and frequent menstruation with regular cycle: Secondary | ICD-10-CM

## 2016-09-04 NOTE — Patient Instructions (Signed)
1. Menorrhagia with regular cycle Thin Endometrium 3.2 mm.  Decision to proceed with Removal of IUD, HSC/D+C/Novasure Endometrial Ablation.  Surgery and risks reviewed in details including trauma (uterine perforation), infection, hemorrhage, failure.  2. Fibroids, intramural Will observe.  3. Left ovarian cyst 2 functional cysts.  1 possible small Endometrioma 3.2 cm.  H/O Endometriosis.  No current pelvic pain, just around periods.  Decision to observe, will repeat Pelvic US in 6 months.  Good to see you again Dawn Scott!  [name] will call you to schedule surgery.  Would be best just after your next period.

## 2016-09-04 NOTE — Progress Notes (Signed)
    Dawn Scott 07/25/1970 [id number]        46 y.o.  G2P0011   RP:  Pelvic US for Menorrhagia with pelvic pain  Past medical history,surgical history, problem list, medications, allergies, family history and social history were all reviewed and documented in the EPIC chart.  Directed ROS with pertinent positives and negatives documented in the history of present illness/assessment and plan.  Exam:  Vitals:   09/04/16 1606  BP: 120/84  Weight: 200 lb (90.7 kg)  Height: 5\' 9"  (1.753 m)   General appearance:  Normal  Pelvic US today:  T/V Anteverted Uterus with IUD seen in normal position.  Uterine length 9.8 cm.  Intramural fibroids: 2.6 cm, 3.7 cm, 2.2 cm, 4.3 cm.  Endometrial line 3.2 mm.  Rt Ovary normal.  Lt Ovary enlarged with 3 cysts: 2 thin walled simple cysts at 2.7 cm and 3.2 cm.  1 thin walled cyst with low level echoes at 3.2 cm. Dopplers: No increased BF to any cyst.  Good arterial BF to both Ovaries.  No FF in CDS.  Assessment/Plan:  46 y.o. G2P0011   1. Menorrhagia with regular cycle Thin Endometrium 3.2 mm.  Decision to proceed with Removal of IUD, HSC/D+C/Novasure Endometrial Ablation.  Surgery and risks reviewed in details including trauma (uterine perforation), infection, hemorrhage, failure.  2. Fibroids, intramural Will observe.  3. Left ovarian cyst 2 functional cysts.  1 possible small Endometrioma 3.2 cm.  H/O Endometriosis.  No current pelvic pain, just around periods.  Decision to observe, will repeat Pelvic US in 6 months.  Counseling on above issues >50% x 15 minutes.  Princess Bruins MD, 4:10 PM 09/04/2016

## 2016-09-05 ENCOUNTER — Telehealth: Payer: Self-pay

## 2016-09-05 NOTE — Telephone Encounter (Signed)
I contacted patient regarding scheduling surgery.  I discussed her ins benefits and her estimated surgery prepymt due by one week prior to surgery. Financial letter will be sent. I explained procedure should be schedule shortly after period ends.  She has calculated and wants to wait until July because she has vacation planned in June for right after her menses.  She wants July 5th and assures me this should be right after menses.  She will call me if anything changes in her cycle to where this is not the case.  She understands she should not be on her period for surgery.  Surgery scheduled for 10/30/16 7:30am at [facility]. Pamphlet will be mailed.

## 2016-09-18 DIAGNOSIS — M9901 Segmental and somatic dysfunction of cervical region: Secondary | ICD-10-CM | POA: Diagnosis not present

## 2016-09-18 DIAGNOSIS — M5411 Radiculopathy, occipito-atlanto-axial region: Secondary | ICD-10-CM | POA: Diagnosis not present

## 2016-09-23 DIAGNOSIS — M5411 Radiculopathy, occipito-atlanto-axial region: Secondary | ICD-10-CM | POA: Diagnosis not present

## 2016-09-23 DIAGNOSIS — M9901 Segmental and somatic dysfunction of cervical region: Secondary | ICD-10-CM | POA: Diagnosis not present

## 2016-09-24 DIAGNOSIS — M5411 Radiculopathy, occipito-atlanto-axial region: Secondary | ICD-10-CM | POA: Diagnosis not present

## 2016-09-24 DIAGNOSIS — M9901 Segmental and somatic dysfunction of cervical region: Secondary | ICD-10-CM | POA: Diagnosis not present

## 2016-09-25 DIAGNOSIS — M5411 Radiculopathy, occipito-atlanto-axial region: Secondary | ICD-10-CM | POA: Diagnosis not present

## 2016-09-25 DIAGNOSIS — M9901 Segmental and somatic dysfunction of cervical region: Secondary | ICD-10-CM | POA: Diagnosis not present

## 2016-09-30 DIAGNOSIS — M9901 Segmental and somatic dysfunction of cervical region: Secondary | ICD-10-CM | POA: Diagnosis not present

## 2016-09-30 DIAGNOSIS — M5411 Radiculopathy, occipito-atlanto-axial region: Secondary | ICD-10-CM | POA: Diagnosis not present

## 2016-10-02 DIAGNOSIS — M9901 Segmental and somatic dysfunction of cervical region: Secondary | ICD-10-CM | POA: Diagnosis not present

## 2016-10-02 DIAGNOSIS — M5411 Radiculopathy, occipito-atlanto-axial region: Secondary | ICD-10-CM | POA: Diagnosis not present

## 2016-10-16 ENCOUNTER — Encounter: Payer: Self-pay | Admitting: Anesthesiology

## 2016-10-21 ENCOUNTER — Encounter (HOSPITAL_BASED_OUTPATIENT_CLINIC_OR_DEPARTMENT_OTHER): Payer: Self-pay | Admitting: *Deleted

## 2016-10-21 NOTE — Progress Notes (Signed)
NPO AFTER MN.  ARRIVE AT 0600.  NEEDS URINE PREG.  GETTING CBC DONE PRIOR TO DOS.  WILL TAKE CYMBALTA AND WELLBUTRIN AM DOS W/ SIPS OF WATER.

## 2016-10-24 DIAGNOSIS — Z79899 Other long term (current) drug therapy: Secondary | ICD-10-CM | POA: Diagnosis not present

## 2016-10-24 DIAGNOSIS — N920 Excessive and frequent menstruation with regular cycle: Secondary | ICD-10-CM | POA: Diagnosis present

## 2016-10-24 DIAGNOSIS — D251 Intramural leiomyoma of uterus: Secondary | ICD-10-CM | POA: Diagnosis not present

## 2016-10-24 DIAGNOSIS — F329 Major depressive disorder, single episode, unspecified: Secondary | ICD-10-CM | POA: Diagnosis not present

## 2016-10-24 DIAGNOSIS — N858 Other specified noninflammatory disorders of uterus: Secondary | ICD-10-CM | POA: Diagnosis not present

## 2016-10-24 DIAGNOSIS — Z30432 Encounter for removal of intrauterine contraceptive device: Secondary | ICD-10-CM | POA: Diagnosis not present

## 2016-10-24 DIAGNOSIS — Q51810 Arcuate uterus: Secondary | ICD-10-CM | POA: Diagnosis not present

## 2016-10-24 LAB — CBC
HEMATOCRIT: 39.7 % (ref 36.0–46.0)
Hemoglobin: 13.5 g/dL (ref 12.0–15.0)
MCH: 29.2 pg (ref 26.0–34.0)
MCHC: 34 g/dL (ref 30.0–36.0)
MCV: 85.7 fL (ref 78.0–100.0)
Platelets: 301 10*3/uL (ref 150–400)
RBC: 4.63 MIL/uL (ref 3.87–5.11)
RDW: 12.8 % (ref 11.5–15.5)
WBC: 5.1 10*3/uL (ref 4.0–10.5)

## 2016-10-27 ENCOUNTER — Telehealth: Payer: Self-pay

## 2016-10-27 NOTE — Telephone Encounter (Signed)
Patient called in voice mail about pain med for surgery. Left message to call.

## 2016-10-27 NOTE — Telephone Encounter (Signed)
Patient asked if she could go ahead and get post op pain medication now and get it filled ahead of time or will she need to wait until after surgery.

## 2016-10-28 MED ORDER — OXYCODONE-ACETAMINOPHEN 5-325 MG PO TABS: 1.0000 | ORAL_TABLET | ORAL | 0 refills | Status: DC | PRN

## 2016-10-28 NOTE — Telephone Encounter (Signed)
Per Dr. Carlean Jews. Give 5 mg. Rx printed and signed. Left patient a message that it has to be picked up and will be at the front desk.

## 2016-10-28 NOTE — Telephone Encounter (Signed)
Please advise dose of Percocet? ( 2.5, 5, 7.5, 10)

## 2016-10-28 NOTE — Telephone Encounter (Signed)
Agree to give a prescription of Percocet #6.  Tell her that she may only need Ibuprofen OTC though for that type of surgery.

## 2016-10-30 ENCOUNTER — Encounter: Payer: Self-pay | Admitting: Anesthesiology

## 2016-10-30 ENCOUNTER — Ambulatory Visit (HOSPITAL_BASED_OUTPATIENT_CLINIC_OR_DEPARTMENT_OTHER): Payer: 59 | Admitting: Anesthesiology

## 2016-10-30 ENCOUNTER — Ambulatory Visit (HOSPITAL_BASED_OUTPATIENT_CLINIC_OR_DEPARTMENT_OTHER)
Admission: RE | Admit: 2016-10-30 | Discharge: 2016-10-30 | Disposition: A | Discharge status: Home / Self Care | Payer: 59 | Source: Ambulatory Visit | Attending: Obstetrics & Gynecology | Admitting: Obstetrics & Gynecology

## 2016-10-30 ENCOUNTER — Encounter (HOSPITAL_BASED_OUTPATIENT_CLINIC_OR_DEPARTMENT_OTHER): Payer: Self-pay | Admitting: *Deleted

## 2016-10-30 ENCOUNTER — Encounter (HOSPITAL_BASED_OUTPATIENT_CLINIC_OR_DEPARTMENT_OTHER)
Admission: RE | Disposition: A | Discharge status: Home / Self Care | Payer: Self-pay | Source: Ambulatory Visit | Attending: Obstetrics & Gynecology

## 2016-10-30 DIAGNOSIS — Q51810 Arcuate uterus: Secondary | ICD-10-CM | POA: Insufficient documentation

## 2016-10-30 DIAGNOSIS — Z30432 Encounter for removal of intrauterine contraceptive device: Secondary | ICD-10-CM | POA: Insufficient documentation

## 2016-10-30 DIAGNOSIS — F329 Major depressive disorder, single episode, unspecified: Secondary | ICD-10-CM | POA: Insufficient documentation

## 2016-10-30 DIAGNOSIS — N920 Excessive and frequent menstruation with regular cycle: Secondary | ICD-10-CM | POA: Diagnosis not present

## 2016-10-30 DIAGNOSIS — D251 Intramural leiomyoma of uterus: Secondary | ICD-10-CM | POA: Insufficient documentation

## 2016-10-30 DIAGNOSIS — N858 Other specified noninflammatory disorders of uterus: Secondary | ICD-10-CM | POA: Insufficient documentation

## 2016-10-30 DIAGNOSIS — Z79899 Other long term (current) drug therapy: Secondary | ICD-10-CM | POA: Insufficient documentation

## 2016-10-30 DIAGNOSIS — D259 Leiomyoma of uterus, unspecified: Secondary | ICD-10-CM | POA: Diagnosis not present

## 2016-10-30 HISTORY — DX: Personal history of other diseases of the female genital tract: Z87.42

## 2016-10-30 HISTORY — DX: Personal history of other complications of pregnancy, childbirth and the puerperium: Z87.59

## 2016-10-30 HISTORY — DX: Unspecified ovarian cyst, left side: N83.202

## 2016-10-30 HISTORY — DX: Excessive and frequent menstruation with regular cycle: N92.0

## 2016-10-30 HISTORY — PX: HYSTEROSCOPY WITH NOVASURE: SHX5574

## 2016-10-30 HISTORY — PX: DILATATION & CURETTAGE/HYSTEROSCOPY WITH MYOSURE: SHX6511

## 2016-10-30 HISTORY — DX: Nausea with vomiting, unspecified: R11.2

## 2016-10-30 HISTORY — DX: Other specified postprocedural states: Z98.890

## 2016-10-30 HISTORY — DX: Leiomyoma of uterus, unspecified: D25.9

## 2016-10-30 LAB — POCT PREGNANCY, URINE: PREG TEST UR: NEGATIVE

## 2016-10-30 SURGERY — DILATATION & CURETTAGE/HYSTEROSCOPY WITH MYOSURE
Anesthesia: General | Site: Vagina

## 2016-10-30 MED ORDER — CEFAZOLIN SODIUM-DEXTROSE 2-4 GM/100ML-% IV SOLN
2.0000 g | INTRAVENOUS | Status: AC
  Administered 2016-10-30: 2 g via INTRAVENOUS
  Filled 2016-10-30: qty 100

## 2016-10-30 MED ORDER — DEXAMETHASONE SODIUM PHOSPHATE 10 MG/ML IJ SOLN
INTRAMUSCULAR | Status: AC
  Filled 2016-10-30: qty 1

## 2016-10-30 MED ORDER — ONDANSETRON HCL 4 MG/2ML IJ SOLN
INTRAMUSCULAR | Status: DC | PRN
  Administered 2016-10-30: 4 mg via INTRAVENOUS

## 2016-10-30 MED ORDER — SOD CITRATE-CITRIC ACID 500-334 MG/5ML PO SOLN
30.0000 mL | ORAL | Status: DC
  Filled 2016-10-30: qty 30

## 2016-10-30 MED ORDER — ONDANSETRON HCL 4 MG/2ML IJ SOLN
INTRAMUSCULAR | Status: AC
  Filled 2016-10-30: qty 2

## 2016-10-30 MED ORDER — SCOPOLAMINE 1 MG/3DAYS TD PT72
1.0000 | MEDICATED_PATCH | TRANSDERMAL | Status: DC
  Administered 2016-10-30: 1.5 mg via TRANSDERMAL
  Filled 2016-10-30: qty 1

## 2016-10-30 MED ORDER — LIDOCAINE 2% (20 MG/ML) 5 ML SYRINGE
INTRAMUSCULAR | Status: AC
  Filled 2016-10-30: qty 5

## 2016-10-30 MED ORDER — DEXAMETHASONE SODIUM PHOSPHATE 10 MG/ML IJ SOLN
INTRAMUSCULAR | Status: DC | PRN
  Administered 2016-10-30: 10 mg via INTRAVENOUS

## 2016-10-30 MED ORDER — KETOROLAC TROMETHAMINE 30 MG/ML IJ SOLN
INTRAMUSCULAR | Status: DC | PRN
  Administered 2016-10-30: 30 mg via INTRAVENOUS

## 2016-10-30 MED ORDER — FENTANYL CITRATE (PF) 100 MCG/2ML IJ SOLN
INTRAMUSCULAR | Status: AC
  Filled 2016-10-30: qty 2

## 2016-10-30 MED ORDER — MIDAZOLAM HCL 5 MG/5ML IJ SOLN
INTRAMUSCULAR | Status: DC | PRN
  Administered 2016-10-30: 2 mg via INTRAVENOUS

## 2016-10-30 MED ORDER — CEFAZOLIN SODIUM-DEXTROSE 2-4 GM/100ML-% IV SOLN
INTRAVENOUS | Status: AC
  Filled 2016-10-30: qty 100

## 2016-10-30 MED ORDER — LACTATED RINGERS IV SOLN
INTRAVENOUS | Status: DC
  Filled 2016-10-30: qty 1000

## 2016-10-30 MED ORDER — CHLOROPROCAINE HCL 1 % IJ SOLN
INTRAMUSCULAR | Status: DC | PRN
  Administered 2016-10-30: 20 mL

## 2016-10-30 MED ORDER — FENTANYL CITRATE (PF) 100 MCG/2ML IJ SOLN
INTRAMUSCULAR | Status: DC | PRN
  Administered 2016-10-30: 50 ug via INTRAVENOUS

## 2016-10-30 MED ORDER — PROPOFOL 10 MG/ML IV BOLUS
INTRAVENOUS | Status: DC | PRN
  Administered 2016-10-30: 200 mg via INTRAVENOUS

## 2016-10-30 MED ORDER — KETOROLAC TROMETHAMINE 30 MG/ML IJ SOLN
INTRAMUSCULAR | Status: AC
  Filled 2016-10-30: qty 1

## 2016-10-30 MED ORDER — LIDOCAINE 2% (20 MG/ML) 5 ML SYRINGE
INTRAMUSCULAR | Status: DC | PRN
  Administered 2016-10-30: 100 mg via INTRAVENOUS

## 2016-10-30 MED ORDER — LACTATED RINGERS IV SOLN
INTRAVENOUS | Status: AC | PRN
  Administered 2016-10-30: 3000 mL via INTRAUTERINE

## 2016-10-30 MED ORDER — PROPOFOL 10 MG/ML IV BOLUS
INTRAVENOUS | Status: AC
  Filled 2016-10-30: qty 40

## 2016-10-30 MED ORDER — SCOPOLAMINE 1 MG/3DAYS TD PT72
MEDICATED_PATCH | TRANSDERMAL | Status: AC
  Filled 2016-10-30: qty 1

## 2016-10-30 MED ORDER — LACTATED RINGERS IV SOLN
INTRAVENOUS | Status: DC
  Administered 2016-10-30: 07:00:00 via INTRAVENOUS
  Filled 2016-10-30: qty 1000

## 2016-10-30 MED ORDER — MIDAZOLAM HCL 2 MG/2ML IJ SOLN
INTRAMUSCULAR | Status: AC
  Filled 2016-10-30: qty 2

## 2016-10-30 SURGICAL SUPPLY — 25 items
ABLATOR ENDOMETRIAL BIPOLAR (ABLATOR) ×6 IMPLANT
CANISTER SUCT 3000ML PPV (MISCELLANEOUS) ×3 IMPLANT
CATH ROBINSON RED A/P 16FR (CATHETERS) ×3 IMPLANT
CLOTH BEACON ORANGE TIMEOUT ST (SAFETY) ×3 IMPLANT
CONTAINER PREFILL 10% NBF 60ML (FORM) IMPLANT
DEVICE MYOSURE LITE (MISCELLANEOUS) IMPLANT
DEVICE MYOSURE REACH (MISCELLANEOUS) IMPLANT
DILATOR CANAL MILEX (MISCELLANEOUS) IMPLANT
ELECT REM PT RETURN 9FT ADLT (ELECTROSURGICAL)
ELECTRODE REM PT RTRN 9FT ADLT (ELECTROSURGICAL) IMPLANT
FILTER ARTHROSCOPY CONVERTOR (FILTER) ×3 IMPLANT
GLOVE BIO SURGEON STRL SZ 6.5 (GLOVE) ×3 IMPLANT
GLOVE BIOGEL PI IND STRL 7.0 (GLOVE) ×4 IMPLANT
GLOVE BIOGEL PI INDICATOR 7.0 (GLOVE) ×2
GOWN STRL REUS W/TWL LRG LVL3 (GOWN DISPOSABLE) ×6 IMPLANT
IV NS IRRIG 3000ML ARTHROMATIC (IV SOLUTION) ×6 IMPLANT
MYOSURE XL FIBROID REM (MISCELLANEOUS)
PACK VAGINAL MINOR WOMEN LF (CUSTOM PROCEDURE TRAY) ×3 IMPLANT
PAD OB MATERNITY 4.3X12.25 (PERSONAL CARE ITEMS) ×3 IMPLANT
PAD PREP 24X48 CUFFED NSTRL (MISCELLANEOUS) ×3 IMPLANT
SEAL ROD LENS SCOPE MYOSURE (ABLATOR) IMPLANT
SYSTEM TISS REMOVAL MYSR XL RM (MISCELLANEOUS) IMPLANT
TOWEL OR 17X24 6PK STRL BLUE (TOWEL DISPOSABLE) ×6 IMPLANT
TUBING AQUILEX INFLOW (TUBING) ×3 IMPLANT
TUBING AQUILEX OUTFLOW (TUBING) ×3 IMPLANT

## 2016-10-30 NOTE — Anesthesia Procedure Notes (Signed)
Procedure Name: LMA Insertion Date/Time: 10/30/2016 8:05 AM Performed by: Bethena Roys T Pre-anesthesia Checklist: Patient identified, Emergency Drugs available, Suction available and Patient being monitored Patient Re-evaluated:Patient Re-evaluated prior to inductionOxygen Delivery Method: Circle system utilized Preoxygenation: Pre-oxygenation with 100% oxygen Intubation Type: IV induction Ventilation: Mask ventilation without difficulty LMA: LMA inserted LMA Size: 4.0 Number of attempts: 1 Airway Equipment and Method: Bite block Placement Confirmation: positive ETCO2 Dental Injury: Teeth and Oropharynx as per pre-operative assessment

## 2016-10-30 NOTE — Anesthesia Postprocedure Evaluation (Signed)
Anesthesia Post Note  Patient: Dawn Scott  Procedure(s) Performed: Procedure(s) (LRB): , REMOVAL IUD, HYSTEROSCOPY, DILATION AND CURETTAGE (N/A) HYSTEROSCOPY WITH NOVASURE (N/A)     Patient location during evaluation: PACU Anesthesia Type: General Level of consciousness: awake and alert Pain management: pain level controlled Vital Signs Assessment: post-procedure vital signs reviewed and stable Respiratory status: spontaneous breathing, nonlabored ventilation, respiratory function stable and patient connected to nasal cannula oxygen Cardiovascular status: blood pressure returned to baseline and stable Postop Assessment: no signs of nausea or vomiting Anesthetic complications: no    Last Vitals:  Vitals:   10/30/16 0900 10/30/16 0915  BP: 124/70 130/72  Pulse: 79 85  Resp: 13 13  Temp:      Last Pain:  Vitals:   10/30/16 0915  TempSrc:   PainSc: 4                  Lanice Folden

## 2016-10-30 NOTE — Discharge Instructions (Addendum)
°Post Anesthesia Home Care Instructions ° °Activity: °Get plenty of rest for the remainder of the day. A responsible individual must stay with you for 24 hours following the procedure.  °For the next 24 hours, DO NOT: °-Drive a car °-Operate machinery °-Drink alcoholic beverages °-Take any medication unless instructed by your physician °-Make any legal decisions or sign important papers. ° °Meals: °Start with liquid foods such as gelatin or soup. Progress to regular foods as tolerated. Avoid greasy, spicy, heavy foods. If nausea and/or vomiting occur, drink only clear liquids until the nausea and/or vomiting subsides. Call your physician if vomiting continues. ° °Special Instructions/Symptoms: °Your throat may feel dry or sore from the anesthesia or the breathing tube placed in your throat during surgery. If this causes discomfort, gargle with warm salt water. The discomfort should disappear within 24 hours. ° °If you had a scopolamine patch placed behind your ear for the management of post- operative nausea and/or vomiting: ° °1. The medication in the patch is effective for 72 hours, after which it should be removed.  Wrap patch in a tissue and discard in the trash. Wash hands thoroughly with soap and water. °2. You may remove the patch earlier than 72 hours if you experience unpleasant side effects which may include dry mouth, dizziness or visual disturbances. °3. Avoid touching the patch. Wash your hands with soap and water after contact with the patch. °  ° °Endometrial Ablation °Endometrial ablation is a procedure that destroys the thin inner layer of the lining of the uterus (endometrium). This procedure may be done: °· To stop heavy periods. °· To stop bleeding that is causing anemia. °· To control irregular bleeding. °· To treat bleeding caused by small tumors (fibroids) in the endometrium. ° °This procedure is often an alternative to major surgery, such as removal of the uterus and cervix (hysterectomy).  As a result of this procedure: °· You may not be able to have children. However, if you are premenopausal (you have not gone through menopause): °? You may still have a small chance of getting pregnant. °? You will need to use a reliable method of birth control after the procedure to prevent pregnancy. °· You may stop having a menstrual period, or you may have only a small amount of bleeding during your period. Menstruation may return several years after the procedure. ° °Tell a health care provider about: °· Any allergies you have. °· All medicines you are taking, including vitamins, herbs, eye drops, creams, and over-the-counter medicines. °· Any problems you or family members have had with the use of anesthetic medicines. °· Any blood disorders you have. °· Any surgeries you have had. °· Any medical conditions you have. °What are the risks? °Generally, this is a safe procedure. However, problems may occur, including: °· A hole (perforation) in the uterus or bowel. °· Infection of the uterus, bladder, or vagina. °· Bleeding. °· Damage to other structures or organs. °· An air bubble in the lung (air embolus). °· Problems with pregnancy after the procedure. °· Failure of the procedure. °· Decreased ability to diagnose cancer in the endometrium. ° °What happens before the procedure? °· You will have tests of your endometrium to make sure there are no pre-cancerous cells or cancer cells present. °· You may have an ultrasound of the uterus. °· You may be given medicines to thin the endometrium. °· Ask your health care provider about: °? Changing or stopping your regular medicines. This is especially important if you   take diabetes medicines or blood thinners. °? Taking medicines such as aspirin and ibuprofen. These medicines can thin your blood. Do not take these medicines before your procedure if your doctor tells you not to. °· Plan to have someone take you home from the hospital or clinic. °What happens during the  procedure? °· You will lie on an exam table with your feet and legs supported as in a pelvic exam. °· To lower your risk of infection: °? Your health care team will wash or sanitize their hands and put on germ-free (sterile) gloves. °? Your genital area will be washed with soap. °· An IV tube will be inserted into one of your veins. °· You will be given a medicine to help you relax (sedative). °· A surgical instrument with a light and camera (resectoscope) will be inserted into your vagina and moved into your uterus. This allows your surgeon to see inside your uterus. °· Endometrial tissue will be removed using one of the following methods: °? Radiofrequency. This method uses a radiofrequency-alternating electric current to remove the endometrium. °? Cryotherapy. This method uses extreme cold to freeze the endometrium. °? Heated-free liquid. This method uses a heated saltwater (saline) solution to remove the endometrium. °? Microwave. This method uses high-energy microwaves to heat up the endometrium and remove it. °? Thermal balloon. This method involves inserting a catheter with a balloon tip into the uterus. The balloon tip is filled with heated fluid to remove the endometrium. °The procedure may vary among health care providers and hospitals. °What happens after the procedure? °· Your blood pressure, heart rate, breathing rate, and blood oxygen level will be monitored until the medicines you were given have worn off. °· As tissue healing occurs, you may notice vaginal bleeding for 4-6 weeks after the procedure. You may also experience: °? Cramps. °? Thin, watery vaginal discharge that is light pink or brown in color. °? A need to urinate more frequently than usual. °? Nausea. °· Do not drive for 24 hours if you were given a sedative. °· Do not have sex or insert anything into your vagina until your health care provider approves. °Summary °· Endometrial ablation is done to treat the many causes of heavy menstrual  bleeding. °· The procedure may be done only after medications have been tried to control the bleeding. °· Plan to have someone take you home from the hospital or clinic. °This information is not intended to replace advice given to you by your health care provider. Make sure you discuss any questions you have with your health care provider. °Document Released: 02/22/2004 Document Revised: 05/01/2016 Document Reviewed: 05/01/2016 °Elsevier Interactive Patient Education © 2017 [employer] ° °

## 2016-10-30 NOTE — Anesthesia Preprocedure Evaluation (Addendum)
Anesthesia Evaluation  Patient identified by MRN, date of birth, ID band Patient awake    Reviewed: Allergy & Precautions, NPO status , Patient's Chart, lab work & pertinent test results  History of Anesthesia Complications (+) PONV and history of anesthetic complications  Airway Mallampati: II  TM Distance: >3 FB Neck ROM: Full    Dental no notable dental hx.    Pulmonary neg pulmonary ROS,    Pulmonary exam normal breath sounds clear to auscultation       Cardiovascular negative cardio ROS Normal cardiovascular exam Rhythm:Regular Rate:Normal     Neuro/Psych PSYCHIATRIC DISORDERS Depression negative neurological ROS  negative psych ROS   GI/Hepatic negative GI ROS, Neg liver ROS,   Endo/Other  negative endocrine ROS  Renal/GU negative Renal ROS  negative genitourinary   Musculoskeletal negative musculoskeletal ROS (+)   Abdominal   Peds negative pediatric ROS (+)  Hematology negative hematology ROS (+)   Anesthesia Other Findings   Reproductive/Obstetrics negative OB ROS                             Anesthesia Physical Anesthesia Plan  ASA: II  Anesthesia Plan: General   Post-op Pain Management:    Induction: Intravenous  PONV Risk Score and Plan: 3 and Ondansetron, Dexamethasone, Propofol, Midazolam, Scopolamine patch - Pre-op and Metaclopromide  Airway Management Planned: LMA  Additional Equipment:   Intra-op Plan:   Post-operative Plan: Extubation in OR  Informed Consent: I have reviewed the patients History and Physical, chart, labs and discussed the procedure including the risks, benefits and alternatives for the proposed anesthesia with the patient or authorized representative who has indicated his/her understanding and acceptance.   Dental advisory given  Plan Discussed with: CRNA and Surgeon  Anesthesia Plan Comments: ( )        Anesthesia Quick  Evaluation

## 2016-10-30 NOTE — H&P (Signed)
@LOGO @  Dawn Scott is an 46 y.o. female. G2P1A1  Widowed, boyfriend present  RP:  Refractory Menorrhagia for Dawn Scott D+C, Novasure Endometrial Ablation  Pertinent Gynecological History: Menses: Heavy flow Contraception: IUD Blood transfusions: none Sexually transmitted diseases: no past history Last mammogram: Normal Last pap: Normal   Menstrual History:  Patient's last menstrual period was 10/12/2016 (exact date).    Past Medical History:  Diagnosis Date  . Anxiety   . Depression   . History of ectopic pregnancy    04/ 2002  S/P  LEFT SALPINGECTOMY  . History of endometriosis   . History of ovarian cyst   . Left ovarian cyst   . Menorrhagia with regular cycle   . PONV (postoperative nausea and vomiting)   . Uterine fibroid     Past Surgical History:  Procedure Laterality Date  . DILATION AND EVACUATION  03/10/2003   dr Dellis Filbert   retained placental tissue postpartum  . DX LAPAROSCOPY/ LYSIS ADHESIONAS/  FULGERATION ENDOMETRIOSIS/  LEFT SALPINGECTOMY WITH REMOVAL ECOTPIC  08/18/2000   dr Ronita Hipps  . LAPAROSCOPIC APPENDECTOMY  11/30/2007  . SEPTOPLASTY  Aug 11, 2009  . TONSILLECTOMY AND ADENOIDECTOMY  child  . WISDOM TOOTH EXTRACTION  1996    Family History  Problem Relation Age of Onset  . Depression Mother   . Arthritis Mother   . Heart disease Mother   . Transient ischemic attack Mother   . Alcohol abuse Father   . Depression Father   . Diabetes Father   . Alcohol abuse Maternal Grandfather   . Leukemia Maternal Grandfather   . Early death Maternal Grandfather        leukemia  . Breast cancer Maternal Aunt 08/11/68  . Early death Maternal Aunt        cancer  . Depression Paternal Uncle   . Transient ischemic attack Maternal Grandmother   . Arthritis Paternal Grandmother   . Dementia Paternal Grandmother   . Alcohol abuse Paternal Grandfather   . Early death Paternal Grandfather     Social History:  reports that she has never smoked. She has never used smokeless  tobacco. She reports that she drinks alcohol. She reports that she does not use drugs.  Allergies: No Known Allergies  Prescriptions Prior to Admission  Medication Sig Dispense Refill Last Dose  . buPROPion (WELLBUTRIN XL) 150 MG 24 hr tablet Take 150 mg by mouth every morning.    10/29/2016 at Unknown time  . Cholecalciferol (VITAMIN D3) 2000 units TABS Take 1 tablet by mouth every morning.   10/29/2016 at Unknown time  . Cyanocobalamin (B-12 PO) Take 1 tablet by mouth every morning.    10/29/2016 at Unknown time  . DULoxetine (CYMBALTA) 30 MG capsule Take 30 mg by mouth every morning. Takes w/ 60mg  cap. For total 90 mg   Taking  . DULoxetine (CYMBALTA) 60 MG capsule Take 60 mg by mouth every morning. Patient takes a total of 90 mg daily  1 10/29/2016 at Unknown time  . lamoTRIgine (LAMICTAL) 150 MG tablet Take 150 mg by mouth every evening.   1 10/29/2016 at Unknown time  . loratadine (CLARITIN) 10 MG tablet Take 10 mg by mouth every evening.    10/29/2016 at Unknown time  . Multiple Vitamins-Minerals (MULTIVITAMIN WITH MINERALS) tablet Take 1 tablet by mouth every evening.    10/29/2016 at Unknown time  . oxyCODONE-acetaminophen (ROXICET) 5-325 MG tablet Take 1 tablet by mouth every 4 (four) hours as needed for severe pain. 6 tablet  0     REVIEW OF SYSTEMS: A ROS was performed and pertinent positives and negatives are included in the history.  GENERAL: No fevers or chills. HEENT: No change in vision, no earache, sore throat or sinus congestion. NECK: No pain or stiffness. CARDIOVASCULAR: No chest pain or pressure. No palpitations. PULMONARY: No shortness of breath, cough or wheeze. GASTROINTESTINAL: No abdominal pain, nausea, vomiting or diarrhea, melena or bright red blood per rectum. GENITOURINARY: No urinary frequency, urgency, hesitancy or dysuria. MUSCULOSKELETAL: No joint or muscle pain, no back pain, no recent trauma. DERMATOLOGIC: No rash, no itching, no lesions. ENDOCRINE: No polyuria, polydipsia, no  heat or cold intolerance. No recent change in weight. HEMATOLOGICAL: No anemia or easy bruising or bleeding. NEUROLOGIC: No headache, seizures, numbness, tingling or weakness. PSYCHIATRIC: No depression, no loss of interest in normal activity or change in sleep pattern.     Blood pressure 126/80, pulse 86, temperature 98.8 F (37.1 C), temperature source Oral, resp. rate 16, height 5\' 9"  (1.753 m), weight 202 lb (91.6 kg), last menstrual period 10/12/2016, SpO2 100 %.  Physical Exam:  See office notes  Results for orders placed or performed during the hospital encounter of 10/30/16 (from the past 24 hour(s))  Pregnancy, urine POC     Status: None   Collection Time: 10/30/16  7:14 AM  Result Value Ref Range   Preg Test, Ur NEGATIVE NEGATIVE    Pelvic US 09/04/2016 :  T/V Anteverted Uterus with IUD seen in normal position.  Uterine length 9.8 cm.  Intramural fibroids: 2.6 cm, 3.7 cm, 2.2 cm, 4.3 cm.  Endometrial line 3.2 mm.  Rt Ovary normal.  Lt Ovary enlarged with 3 cysts: 2 thin walled simple cysts at 2.7 cm and 3.2 cm.  1 thin walled cyst with low level echoes at 3.2 cm. Dopplers: No increased BF to any cyst.  Good arterial BF to both Ovaries.  No FF in CDS.  Assessment/Plan:  1. Menorrhagia with regular cycle Thin Endometrium 3.2 mm.  Decision to proceed with Removal of IUD, HSC/D+C/Novasure Endometrial Ablation.  Surgery and risks reviewed in details including trauma (uterine perforation), infection, hemorrhage, failure.  2. Fibroids, intramural Will observe.  3. Left ovarian cyst 2 functional cysts.  1 possible small Endometrioma 3.2 cm.  H/O Endometriosis.  No current pelvic pain, just around periods.  Decision to observe, will repeat Pelvic US in 6 months.   [name] 10/30/2016, 8:01 AM

## 2016-10-30 NOTE — Op Note (Signed)
Operative Note  10/30/2016  8:39 AM  PATIENT:  Dawn Scott  46 y.o. female  PRE-OPERATIVE DIAGNOSIS:  Menorrhagia, intramural fibroids, IUD  POST-OPERATIVE DIAGNOSIS:  Menorrhagia, intramural fibroids, IUD, Arcuate Uterus  PROCEDURE:  Procedure(s): REMOVAL OF IUD, DIAGNOSTIC HYSTEROSCOPY, D+C, ENDOMETRIAL ABLATION WITH NOVASURE  SURGEON:  Surgeon(s): Princess Bruins, MD  ANESTHESIA:   general with Laryngeal Mask  FINDINGS:  Arcuate Uterus  DESCRIPTION OF OPERATION:  Under general anesthesia with laryngeal mask the patient is in lithotomy position. She is prepped with Betadine on the suprapubic, vulvar and vaginal areas. The bladder is catheterized. The patient is draped as usual. A timeout is done.  The vaginal exam reveals an anteverted uterus, mildly increased in volume and nodular from known intramural myomas, no adnexal mass felt.  Per ultrasound she has left ovarian cysts, but that was not felt on exam.  The speculum is inserted in the vagina.  The strings of the the IUD are well seen. The IUD is easily removed by grasping the strings with a fenestrated clamp.  The IUD is intact and discarded.  An additional Betadine prep is done on the cervix. The anterior lip of the cervix is grasped with a tenaculum.  A paracervical block is done with Nesacaine 1% a total of 20 cc at 4 and 8:00.  Dilation of the cervix with Hegar dilators up to #25 without difficulty.  The hysterometry is at 9 cm and the cervical length at 5 cm, for a cavity length of 4 cm.  Insertion of the hysteroscope in the intrauterine cavity.  We note an arcuate shape of the intrauterine cavity.  This is a mild malformation of the cavity and will allow [place] to proceed with the endometrial ablation with NovaSure, although the areaS around the ostia may not be reached as perfectly.  Both ostia are well seen and no other pathology is present.  The hysteroscope is removed. We proceed with a systematic curettage of the intrauterine  cavity on all surfaces with the sharp small curette. The endometrial curetting specimen is sent to pathology.  We further dilate the cervix to #27 to allow insertion of the NovaSure instrument.  The width of the cavity is at 2.6 cm.  The security test is passed.  We proceed with ablation of the endometrium at the power of 46 W for 1 minute and 13 seconds.  The NovaSure instrument is removed.  We note BURNED tissue on the instrument.  The tenaculum is removed from the cervix. Hemostasis is adequate. The speculum is removed as well. The patient is brought to recovery room in good and stable status.   ESTIMATED BLOOD LOSS: 5 cc  FLUID DEFICIT: 170 cc   Intake/Output Summary (Last 24 hours) at 10/30/16 0839 Last data filed at 10/30/16 0834  Gross per 24 hour  Intake              100 ml  Output                5 ml  Net               95 ml     BLOOD ADMINISTERED:none   LOCAL MEDICATIONS USED:  Nesacaine 1% 20 cc for Paracervical block  SPECIMEN:  Source of Specimen:  Endometrial curettings  DISPOSITION OF SPECIMEN:  PATHOLOGY  COUNTS:  YES  PLAN OF CARE: Transfer to PACU  Marie-Lyne LavoieMD8:39 AM

## 2016-10-30 NOTE — Transfer of Care (Signed)
Immediate Anesthesia Transfer of Care Note  Patient: Dawn Scott  Procedure(s) Performed: Procedure(s): DILATATION & CURETTAGE/HYSTEROSCOPY (N/A) HYSTEROSCOPY WITH NOVASURE (N/A)  Patient Location: PACU  Anesthesia Type:General  Level of Consciousness: sedated and responds to stimulation  Airway & Oxygen Therapy: Patient Spontanous Breathing and Patient connected to nasal cannula oxygen  Post-op Assessment: Report given to RN  Post vital signs: Reviewed and stable  Last Vitals: 124/74, 81, 12, 100%, 98.3 Vitals:   10/30/16 0607  BP: 126/80  Pulse: 86  Resp: 16  Temp: 37.1 C    Last Pain:  Vitals:   10/30/16 0607  TempSrc: Oral      Patients Stated Pain Goal: 5 (58/09/98 3382)  Complications: No apparent anesthesia complications

## 2016-10-31 ENCOUNTER — Encounter (HOSPITAL_BASED_OUTPATIENT_CLINIC_OR_DEPARTMENT_OTHER): Payer: Self-pay | Admitting: Obstetrics & Gynecology

## 2016-10-31 ENCOUNTER — Telehealth: Payer: Self-pay | Admitting: *Deleted

## 2016-10-31 NOTE — Telephone Encounter (Signed)
Pt called to let us know she was doing well with Ablation on 10/30/16, no bleeding thus far and cramping has stopped for now. Pt will follow up for post op visit.

## 2016-11-20 ENCOUNTER — Encounter: Payer: Self-pay | Admitting: Obstetrics & Gynecology

## 2016-11-20 ENCOUNTER — Ambulatory Visit (INDEPENDENT_AMBULATORY_CARE_PROVIDER_SITE_OTHER): Payer: 59 | Admitting: Obstetrics & Gynecology

## 2016-11-20 VITALS — BP 120/76

## 2016-11-20 DIAGNOSIS — Z09 Encounter for follow-up examination after completed treatment for conditions other than malignant neoplasm: Secondary | ICD-10-CM | POA: Diagnosis not present

## 2016-11-20 DIAGNOSIS — Q51810 Arcuate uterus: Secondary | ICD-10-CM | POA: Diagnosis not present

## 2016-11-20 NOTE — Progress Notes (Signed)
    Dawn Scott December 08, 1970 [id number]        45 y.o.  G2P0011   RP:  Post HSC/D+C/Novasure Endometrial Ablation on 10/30/2016  HPI:  Very good postop.  No pelvic pain, no vaginal bleeding, no abnormal d/c.  No fever.  Mictions/BMs wnl.    Past medical history,surgical history, problem list, medications, allergies, family history and social history were all reviewed and documented in the EPIC chart.  Directed ROS with pertinent positives and negatives documented in the history of present illness/assessment and plan.  Exam:  Vitals:   11/20/16 1542  BP: 120/76   General appearance:  Normal  Abdo soft, NT  Gyn exam:  Vulva normal                    Bimanual:  Uterus AV, NT, mobile, normal volume.  No Adnexal mass, NT.  No abnormal vaginal d/c, no blood.  Op findings:  Arcuate Uterus  Patho:  Benign secretory endometrium, no hyperplasia, no malignancy.  Assessment/Plan:  46 y.o. G2P0011   1. Follow-up examination after gynecological surgery Very good post op healing.  No Cx.  2. Arcuate uterus Post Novasure Endometrial Ablation.  Patient informed of findings and reassured.  Patient was informed of the possibility that with the heart shaped uterus, the Novasure instrument might not have destroyed the Endometrium as completely at the fundus.  We will observe her bleeding pattern in the coming months and see how successful the procedure was.  Counseling on above issues >50% x 15 minutes.  Princess Bruins MD, 4:08 PM 11/20/2016

## 2016-11-21 NOTE — Patient Instructions (Signed)
1. Follow-up examination after gynecological surgery Very good post op healing.  No Cx.  2. Arcuate uterus Post Novasure Endometrial Ablation.  Patient informed of findings and reassured.  Patient was informed of the possibility that with the heart shaped uterus, the Novasure instrument might not have destroyed the Endometrium as completely at the fundus.  We will observe her bleeding pattern in the coming months and see how successful the procedure was.  Dawn Scott, it was good to see you today!  Happy you did so well with the surgery!

## 2017-03-18 ENCOUNTER — Encounter: Payer: Self-pay | Admitting: Obstetrics & Gynecology

## 2017-03-18 DIAGNOSIS — Z1231 Encounter for screening mammogram for malignant neoplasm of breast: Secondary | ICD-10-CM | POA: Diagnosis not present

## 2017-04-01 ENCOUNTER — Encounter: Payer: Self-pay | Admitting: Obstetrics & Gynecology

## 2017-04-01 DIAGNOSIS — N6321 Unspecified lump in the left breast, upper outer quadrant: Secondary | ICD-10-CM | POA: Diagnosis not present

## 2017-04-08 ENCOUNTER — Encounter: Payer: Self-pay | Admitting: Obstetrics & Gynecology

## 2017-04-08 ENCOUNTER — Other Ambulatory Visit: Payer: Self-pay | Admitting: Radiology

## 2017-04-08 DIAGNOSIS — N6032 Fibrosclerosis of left breast: Secondary | ICD-10-CM | POA: Diagnosis not present

## 2017-04-08 DIAGNOSIS — N6332 Unspecified lump in axillary tail of the left breast: Secondary | ICD-10-CM | POA: Diagnosis not present

## 2017-07-07 ENCOUNTER — Encounter: Payer: Self-pay | Admitting: Obstetrics & Gynecology

## 2017-07-07 DIAGNOSIS — N6489 Other specified disorders of breast: Secondary | ICD-10-CM | POA: Diagnosis not present

## 2017-07-07 DIAGNOSIS — R922 Inconclusive mammogram: Secondary | ICD-10-CM | POA: Diagnosis not present

## 2017-10-06 ENCOUNTER — Encounter: Payer: Self-pay | Admitting: Obstetrics & Gynecology

## 2017-10-06 ENCOUNTER — Ambulatory Visit (INDEPENDENT_AMBULATORY_CARE_PROVIDER_SITE_OTHER): Payer: 59 | Admitting: Obstetrics & Gynecology

## 2017-10-06 VITALS — BP 126/82 | Ht 68.5 in | Wt 198.0 lb

## 2017-10-06 DIAGNOSIS — R87612 Low grade squamous intraepithelial lesion on cytologic smear of cervix (LGSIL): Secondary | ICD-10-CM | POA: Diagnosis not present

## 2017-10-06 DIAGNOSIS — Z113 Encounter for screening for infections with a predominantly sexual mode of transmission: Secondary | ICD-10-CM | POA: Diagnosis not present

## 2017-10-06 DIAGNOSIS — Z789 Other specified health status: Secondary | ICD-10-CM

## 2017-10-06 DIAGNOSIS — Z1151 Encounter for screening for human papillomavirus (HPV): Secondary | ICD-10-CM | POA: Diagnosis not present

## 2017-10-06 DIAGNOSIS — Z01419 Encounter for gynecological examination (general) (routine) without abnormal findings: Secondary | ICD-10-CM

## 2017-10-06 NOTE — Patient Instructions (Signed)
  1. Encounter for routine gynecological examination with Papanicolaou smear of cervix Normal gynecologic exam.  Pap with high-risk HPV done.  Breast exam normal.  Will repeat a left breast ultrasound in September.  Screening mammogram in December 2019.  Health labs with family physician.  Continue with regular physical activity and healthy nutrition.  2. Screen for STD (sexually transmitted disease) -Gono-Chlam on pap  3. Use of condoms for contraception Currently abstinent  Dawn Scott, it was a pleasure seeing you today!  I will inform you of your results as soon as they are available.

## 2017-10-06 NOTE — Progress Notes (Signed)
Dawn Scott [birth year] [id number]   History:    47 y.o. G2P1A1L1 Widowed.  Daughter rising Freshman in HS.  RP:  Established patient presenting for annual gyn exam   HPI: Post Endometrial Ablation with just browning spotting x 1 day every month.  No BTB.  No pelvic pain.  Normal vaginal secretions.  Broke up with boyfriend in January 2019.  Abstinent since then.  Urine and bowel movements normal.  Breasts normal.  Body mass index 29.67.  Walking regularly.  Health labs with family physician.  Past medical history,surgical history, family history and social history were all reviewed and documented in the EPIC chart.  Gynecologic History Patient's last menstrual period was 07/30/2017. Contraception: abstinence/Condoms as needed Last Pap: 10/2015. Results were: Negative/HPV HR neg Last mammogram: 03/2017 . Results were: Left breast nodule, Bx benign.  Breast US 06/2017 probably benign, repeat US in 6 months. Bone Density: Never Colonoscopy: Never  Obstetric History OB History  Gravida Para Term Preterm AB Living  2 1     1 1   SAB TAB Ectopic Multiple Live Births      1        # Outcome Date GA Lbr Len/2nd Weight Sex Delivery Anes PTL Lv  2 Ectopic           1 Para              ROS: A ROS was performed and pertinent positives and negatives are included in the history.  GENERAL: No fevers or chills. HEENT: No change in vision, no earache, sore throat or sinus congestion. NECK: No pain or stiffness. CARDIOVASCULAR: No chest pain or pressure. No palpitations. PULMONARY: No shortness of breath, cough or wheeze. GASTROINTESTINAL: No abdominal pain, nausea, vomiting or diarrhea, melena or bright red blood per rectum. GENITOURINARY: No urinary frequency, urgency, hesitancy or dysuria. MUSCULOSKELETAL: No joint or muscle pain, no back pain, no recent trauma. DERMATOLOGIC: No rash, no itching, no lesions. ENDOCRINE: No polyuria, polydipsia, no heat or cold intolerance. No recent change in  weight. HEMATOLOGICAL: No anemia or easy bruising or bleeding. NEUROLOGIC: No headache, seizures, numbness, tingling or weakness. PSYCHIATRIC: No depression, no loss of interest in normal activity or change in sleep pattern.     Exam:   BP 126/82   Ht 5' 8.5" (1.74 m)   Wt 198 lb (89.8 kg)   LMP 07/30/2017 Comment: no birth control   BMI 29.67 kg/m   Body mass index is 29.67 kg/m.  General appearance : Well developed well nourished female. No acute distress HEENT: Eyes: no retinal hemorrhage or exudates,  Neck supple, trachea midline, no carotid bruits, no thyroidmegaly Lungs: Clear to auscultation, no rhonchi or wheezes, or rib retractions  Heart: Regular rate and rhythm, no murmurs or gallops Breast:Examined in sitting and supine position were symmetrical in appearance, no palpable masses or tenderness,  no skin retraction, no nipple inversion, no nipple discharge, no skin discoloration, no axillary or supraclavicular lymphadenopathy Abdomen: no palpable masses or tenderness, no rebound or guarding Extremities: no edema or skin discoloration or tenderness  Pelvic: Vulva: Normal             Vagina: No gross lesions or discharge  Cervix: No gross lesions or discharge.  Pap/HR HPV, Gono-Chlam done.  Uterus  AV, normal size, shape and consistency, non-tender and mobile  Adnexa  Without masses or tenderness  Anus: Normal   Assessment/Plan:  47 y.o. female for annual exam   1. Encounter for  routine gynecological examination with Papanicolaou smear of cervix Normal gynecologic exam.  Pap with high-risk HPV done.  Breast exam normal.  Will repeat a left breast ultrasound in September.  Screening mammogram in December 2019.  Health labs with family physician.  Continue with regular physical activity and healthy nutrition.  2. Screen for STD (sexually transmitted disease) -Gono-Chlam on pap  3. Use of condoms for contraception Currently abstinent  Princess Bruins MD, 3:45 PM  10/06/2017

## 2017-10-06 NOTE — Addendum Note (Signed)
Addended by: Thurnell Garbe A on: 10/06/2017 04:46 PM   Modules accepted: Orders

## 2017-10-07 LAB — PAP IG, CT-NG NAA, HPV HIGH-RISK
C. trachomatis RNA, TMA: NOT DETECTED
HPV DNA HIGH RISK: DETECTED — AB
N. GONORRHOEAE RNA, TMA: NOT DETECTED

## 2017-10-13 ENCOUNTER — Encounter: Payer: 59 | Admitting: Obstetrics & Gynecology

## 2017-10-27 ENCOUNTER — Encounter: Payer: Self-pay | Admitting: Obstetrics & Gynecology

## 2017-10-27 ENCOUNTER — Ambulatory Visit (INDEPENDENT_AMBULATORY_CARE_PROVIDER_SITE_OTHER): Payer: 59 | Admitting: Obstetrics & Gynecology

## 2017-10-27 VITALS — BP 132/84

## 2017-10-27 DIAGNOSIS — R87612 Low grade squamous intraepithelial lesion on cytologic smear of cervix (LGSIL): Secondary | ICD-10-CM | POA: Diagnosis not present

## 2017-10-27 DIAGNOSIS — R87810 Cervical high risk human papillomavirus (HPV) DNA test positive: Secondary | ICD-10-CM

## 2017-10-27 NOTE — Progress Notes (Signed)
    Dawn Scott [birth year] [id number]        47 y.o.  G2P0011 Widowed.  Daughter rising Freshman in HS.  RP: LGSIL/HPV HR positive for Colposcopy  HPI:  Broke up with boyfriend 04/2017.  Abstinent since then.  Previous Paps normal.  Pap 10/06/2017 LGSIL/HPV HR pos.  Gono-Chlam negative.     OB History  Gravida Para Term Preterm AB Living  2 1     1 1   SAB TAB Ectopic Multiple Live Births      1        # Outcome Date GA Lbr Len/2nd Weight Sex Delivery Anes PTL Lv  2 Ectopic           1 Para             Past medical history,surgical history, problem list, medications, allergies, family history and social history were all reviewed and documented in the EPIC chart.   Directed ROS with pertinent positives and negatives documented in the history of present illness/assessment and plan.  Exam:  Vitals:   10/27/17 1126  BP: 132/84   General appearance:  Normal  Colposcopy Procedure Note STEFFIE WAGGONER 10/27/2017  Indications:  LGSIL/HPV HR positive  Procedure Details  The risks and benefits of the procedure and Verbal informed consent obtained.  Speculum placed in vagina and excellent visualization of cervix achieved, cervix swabbed x 3 with acetic acid solution.  Findings:  Cervix colposcopy:  Physical Exam  Genitourinary:      Vaginal colposcopy: Normal  Vulvar colposcopy:  Grossly normal  Perirectal colposcopy: Grossly normal  The cervix was sprayed with Hurricane before performing the cervical biopsies.  Specimens: HPV 16-18-45 done.  Cervical Biopsy at 12 and 4 O'clock.  ECC.  Complications:  None, Silver Nitrate for hemostasis . Plan:  Pending results, management per results   Assessment/Plan:  47 y.o. G2P0011   1. LGSIL on Pap smear of cervix Counseling on abnormal Pap test and high risk HPV done.  LGSIL with high risk HPV on Pap test October 06, 2017.  No previous abnormal Pap test.  Colposcopy procedure reviewed.  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  Patient informed of colposcopy findings.  Pending cervical biopsies and endocervical curetting.  Pending HPV 16, 18 and 45.  Management per results.  Recommend healthy nutrition and supplements of folic acid to provide the best immune system possible to fight HPV.  2. Cervical high risk HPV (human papillomavirus) test positive As above.  Recent gonorrhea and Chlamydia negative.  Counseling on above issues and coordination of care more than 50% for 10 minutes.  Princess Bruins MD, 11:37 AM 10/27/2017

## 2017-10-27 NOTE — Patient Instructions (Addendum)
1. LGSIL on Pap smear of cervix Counseling on abnormal Pap test and high risk HPV done.  LGSIL with high risk HPV on Pap test October 06, 2017.  No previous abnormal Pap test.  Colposcopy procedure reviewed.  Colposcopy done today showing probable CIN-1.  Patient informed of colposcopy findings.  Pending cervical biopsies and endocervical curetting.  Pending HPV 16, 18 and 45.  Management per results.  Recommend healthy nutrition and supplements of folic acid to provide the best immune system possible to fight HPV.  2. Cervical high risk HPV (human papillomavirus) test positive As above.  Recent gonorrhea and Chlamydia negative.  [name], good seeing you today!  I will inform you of your results as soon as they are available.   Colposcopy, Care After This sheet gives you information about how to care for yourself after your procedure. Your health care provider may also give you more specific instructions. If you have problems or questions, contact your health care provider. What can I expect after the procedure? If you had a colposcopy without a biopsy, you can expect to feel fine right away, but you may have some spotting for a few days. You can go back to your normal activities. If you had a colposcopy with a biopsy, it is common to have:  Soreness and pain. This may last for a few days.  Light-headedness.  Mild vaginal bleeding or dark-colored, grainy discharge. This may last for a few days. The discharge may be due to a solution that was used during the procedure. You may need to wear a sanitary pad during this time.  Spotting for at least 48 hours after the procedure.  Follow these instructions at home:  Take over-the-counter and prescription medicines only as told by your health care provider. Talk with your health care provider about what type of over-the-counter pain medicine and prescription medicine you can start taking again. It is especially important to talk with your health care  provider if you take blood-thinning medicine.  Do not drive or use heavy machinery while taking prescription pain medicine.  For at least 3 days after your procedure, or as long as told by your health care provider, avoid: ? Douching. ? Using tampons. ? Having sexual intercourse.  Continue to use birth control (contraception).  Limit your physical activity for the first day after the procedure as told by your health care provider. Ask your health care provider what activities are safe for you.  It is up to you to get the results of your procedure. Ask your health care provider, or the department performing the procedure, when your results will be ready.  Keep all follow-up visits as told by your health care provider. This is important. Contact a health care provider if:  You develop a skin rash. Get help right away if:  You are bleeding heavily from your vagina or you are passing blood clots. This includes using more than one sanitary pad per hour for 2 hours in a row.  You have a fever or chills.  You have pelvic pain.  You have abnormal, yellow-colored, or bad-smelling vaginal discharge. This could be a sign of infection.  You have severe pain or cramps in your lower abdomen that do not get better with medicine.  You feel light-headed or dizzy, or you faint. Summary  If you had a colposcopy without a biopsy, you can expect to feel fine right away, but you may have some spotting for a few days. You can go  back to your normal activities.  If you had a colposcopy with a biopsy, you may notice mild pain and spotting for 48 hours after the procedure.  Avoid douching, using tampons, and having sexual intercourse for 3 days after the procedure or as long as told by your health care provider.  Contact your health care provider if you have bleeding, severe pain, or signs of infection. This information is not intended to replace advice given to you by your health care provider. Make  sure you discuss any questions you have with your health care provider. Document Released: 02/02/2013 Document Revised: 11/30/2015 Document Reviewed: 11/30/2015 Elsevier Interactive Patient Education  2018 [employer].

## 2017-10-27 NOTE — Addendum Note (Signed)
Addended by: Thurnell Garbe A on: 10/27/2017 02:50 PM   Modules accepted: Orders

## 2017-10-30 LAB — HPV TYPE 16 AND 18/45 RNA
HPV TYPE 16 RNA: NOT DETECTED
HPV Type 18/45 RNA: NOT DETECTED

## 2017-11-02 LAB — TISSUE PATH REPORT 10802

## 2017-11-02 LAB — PATHOLOGY

## 2018-01-18 DIAGNOSIS — M9901 Segmental and somatic dysfunction of cervical region: Secondary | ICD-10-CM | POA: Diagnosis not present

## 2018-01-18 DIAGNOSIS — M5411 Radiculopathy, occipito-atlanto-axial region: Secondary | ICD-10-CM | POA: Diagnosis not present

## 2018-01-20 DIAGNOSIS — M5411 Radiculopathy, occipito-atlanto-axial region: Secondary | ICD-10-CM | POA: Diagnosis not present

## 2018-01-20 DIAGNOSIS — M9901 Segmental and somatic dysfunction of cervical region: Secondary | ICD-10-CM | POA: Diagnosis not present

## 2018-01-25 DIAGNOSIS — M9901 Segmental and somatic dysfunction of cervical region: Secondary | ICD-10-CM | POA: Diagnosis not present

## 2018-01-25 DIAGNOSIS — M5411 Radiculopathy, occipito-atlanto-axial region: Secondary | ICD-10-CM | POA: Diagnosis not present

## 2018-01-27 DIAGNOSIS — M5411 Radiculopathy, occipito-atlanto-axial region: Secondary | ICD-10-CM | POA: Diagnosis not present

## 2018-01-27 DIAGNOSIS — M9901 Segmental and somatic dysfunction of cervical region: Secondary | ICD-10-CM | POA: Diagnosis not present

## 2018-01-28 DIAGNOSIS — N6459 Other signs and symptoms in breast: Secondary | ICD-10-CM | POA: Diagnosis not present

## 2018-02-01 DIAGNOSIS — M5411 Radiculopathy, occipito-atlanto-axial region: Secondary | ICD-10-CM | POA: Diagnosis not present

## 2018-02-01 DIAGNOSIS — M9901 Segmental and somatic dysfunction of cervical region: Secondary | ICD-10-CM | POA: Diagnosis not present

## 2018-02-03 DIAGNOSIS — M9901 Segmental and somatic dysfunction of cervical region: Secondary | ICD-10-CM | POA: Diagnosis not present

## 2018-02-03 DIAGNOSIS — M5411 Radiculopathy, occipito-atlanto-axial region: Secondary | ICD-10-CM | POA: Diagnosis not present

## 2018-02-08 DIAGNOSIS — M5411 Radiculopathy, occipito-atlanto-axial region: Secondary | ICD-10-CM | POA: Diagnosis not present

## 2018-02-08 DIAGNOSIS — M9901 Segmental and somatic dysfunction of cervical region: Secondary | ICD-10-CM | POA: Diagnosis not present

## 2018-02-15 DIAGNOSIS — M5411 Radiculopathy, occipito-atlanto-axial region: Secondary | ICD-10-CM | POA: Diagnosis not present

## 2018-02-15 DIAGNOSIS — M9901 Segmental and somatic dysfunction of cervical region: Secondary | ICD-10-CM | POA: Diagnosis not present

## 2018-02-18 DIAGNOSIS — M9901 Segmental and somatic dysfunction of cervical region: Secondary | ICD-10-CM | POA: Diagnosis not present

## 2018-02-18 DIAGNOSIS — M5411 Radiculopathy, occipito-atlanto-axial region: Secondary | ICD-10-CM | POA: Diagnosis not present

## 2018-02-22 DIAGNOSIS — M9901 Segmental and somatic dysfunction of cervical region: Secondary | ICD-10-CM | POA: Diagnosis not present

## 2018-02-22 DIAGNOSIS — M5411 Radiculopathy, occipito-atlanto-axial region: Secondary | ICD-10-CM | POA: Diagnosis not present

## 2018-02-24 DIAGNOSIS — M9901 Segmental and somatic dysfunction of cervical region: Secondary | ICD-10-CM | POA: Diagnosis not present

## 2018-02-24 DIAGNOSIS — M5411 Radiculopathy, occipito-atlanto-axial region: Secondary | ICD-10-CM | POA: Diagnosis not present

## 2018-03-01 DIAGNOSIS — M9901 Segmental and somatic dysfunction of cervical region: Secondary | ICD-10-CM | POA: Diagnosis not present

## 2018-03-01 DIAGNOSIS — M5411 Radiculopathy, occipito-atlanto-axial region: Secondary | ICD-10-CM | POA: Diagnosis not present

## 2018-03-08 DIAGNOSIS — M9901 Segmental and somatic dysfunction of cervical region: Secondary | ICD-10-CM | POA: Diagnosis not present

## 2018-03-08 DIAGNOSIS — M5411 Radiculopathy, occipito-atlanto-axial region: Secondary | ICD-10-CM | POA: Diagnosis not present

## 2018-03-17 DIAGNOSIS — M9901 Segmental and somatic dysfunction of cervical region: Secondary | ICD-10-CM | POA: Diagnosis not present

## 2018-03-17 DIAGNOSIS — M5411 Radiculopathy, occipito-atlanto-axial region: Secondary | ICD-10-CM | POA: Diagnosis not present

## 2018-04-01 ENCOUNTER — Encounter: Payer: Self-pay | Admitting: Emergency Medicine

## 2018-04-01 DIAGNOSIS — F411 Generalized anxiety disorder: Secondary | ICD-10-CM | POA: Insufficient documentation

## 2018-04-01 HISTORY — DX: Generalized anxiety disorder: F41.1

## 2018-04-14 ENCOUNTER — Ambulatory Visit: Payer: Self-pay | Admitting: Physician Assistant

## 2018-05-03 ENCOUNTER — Ambulatory Visit (INDEPENDENT_AMBULATORY_CARE_PROVIDER_SITE_OTHER): Payer: 59 | Admitting: Obstetrics & Gynecology

## 2018-05-03 ENCOUNTER — Encounter: Payer: Self-pay | Admitting: Obstetrics & Gynecology

## 2018-05-03 VITALS — BP 132/88

## 2018-05-03 DIAGNOSIS — R87810 Cervical high risk human papillomavirus (HPV) DNA test positive: Secondary | ICD-10-CM | POA: Diagnosis not present

## 2018-05-03 DIAGNOSIS — R87612 Low grade squamous intraepithelial lesion on cytologic smear of cervix (LGSIL): Secondary | ICD-10-CM

## 2018-05-03 NOTE — Patient Instructions (Signed)
1. LGSIL on Pap smear of cervix LGSIL with high-risk HPV positive in June 2019.  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  A repeat Pap test was done today with high-risk HPV testing.  Management per results.  Continue folic acid.  2. Cervical high risk HPV (human papillomavirus) test positive As above.  Tamar, always good seeing you!  I will inform you of your results as soon as they are available.

## 2018-05-03 NOTE — Progress Notes (Signed)
    LUS KRIEGEL Mar 06, 1971 [id number]        48 y.o.  G2P0011   RP: 6 month f/u Pap test  HPI: LGSIL/HPV HR pos 09/2017.  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  Abstinent since then.  Menses very light x 1 day monthly post Endometrial Ablation, but LMP had severe cramps toward the Rt pelvis.   OB History  Gravida Para Term Preterm AB Living  2 1     1 1   SAB TAB Ectopic Multiple Live Births      1        # Outcome Date GA Lbr Len/2nd Weight Sex Delivery Anes PTL Lv  2 Ectopic           1 Para             Past medical history,surgical history, problem list, medications, allergies, family history and social history were all reviewed and documented in the EPIC chart.   Directed ROS with pertinent positives and negatives documented in the history of present illness/assessment and plan.  Exam:  Vitals:   05/03/18 1549  BP: 132/88   General appearance:  Normal   Gynecologic exam: Vulva normal.  Speculum:  Cervix/Vagina normal.  Pap/HPV HR done.     Assessment/Plan:  48 y.o. G2P0011   1. LGSIL on Pap smear of cervix LGSIL with high-risk HPV positive in June 2019.  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  A repeat Pap test was done today with high-risk HPV testing.  Management per results.  Continue folic acid.  2. Cervical high risk HPV (human papillomavirus) test positive As above.  Counseling on above issues and coordination of care more than 50% for 15 minutes.  Princess Bruins MD, 4:19 PM 05/03/2018

## 2018-05-04 LAB — PAP, TP IMAGING W/ HPV RNA, RFLX HPV TYPE 16,18/45: HPV DNA High Risk: NOT DETECTED

## 2018-05-15 ENCOUNTER — Other Ambulatory Visit: Payer: Self-pay | Admitting: Physician Assistant

## 2018-05-17 ENCOUNTER — Encounter: Payer: Self-pay | Admitting: Obstetrics & Gynecology

## 2018-05-17 DIAGNOSIS — Z1231 Encounter for screening mammogram for malignant neoplasm of breast: Secondary | ICD-10-CM | POA: Diagnosis not present

## 2018-05-17 DIAGNOSIS — Z803 Family history of malignant neoplasm of breast: Secondary | ICD-10-CM | POA: Diagnosis not present

## 2018-05-17 NOTE — Telephone Encounter (Signed)
Not seen in epic review paper chart for dosage

## 2018-05-19 ENCOUNTER — Ambulatory Visit: Payer: Self-pay | Admitting: Physician Assistant

## 2018-05-25 ENCOUNTER — Encounter: Payer: Self-pay | Admitting: Family Medicine

## 2018-05-25 ENCOUNTER — Ambulatory Visit: Payer: 59 | Admitting: Family Medicine

## 2018-05-25 VITALS — BP 122/71 | HR 93 | Temp 98.5°F | Resp 16 | Ht 69.0 in | Wt 199.0 lb

## 2018-05-25 DIAGNOSIS — J0100 Acute maxillary sinusitis, unspecified: Secondary | ICD-10-CM | POA: Diagnosis not present

## 2018-05-25 MED ORDER — AMOXICILLIN-POT CLAVULANATE 875-125 MG PO TABS: 1.0000 | ORAL_TABLET | Freq: Two times a day (BID) | ORAL | 0 refills | Status: DC

## 2018-05-25 MED ORDER — BENZONATATE 200 MG PO CAPS: 200.0000 mg | ORAL_CAPSULE | Freq: Two times a day (BID) | ORAL | 0 refills | Status: DC | PRN

## 2018-05-25 NOTE — Patient Instructions (Addendum)
Rest, hydrate. Start  flonase, mucinex (DM if cough) augmentin prescribed, take until completed.  Tessalon perles for cough If cough present it can last up to 6-8 weeks.  F/U 2 weeks of not improved.     Sinusitis, Adult Sinusitis is soreness and swelling (inflammation) of your sinuses. Sinuses are hollow spaces in the bones around your face. They are located:  Around your eyes.  In the middle of your forehead.  Behind your nose.  In your cheekbones. Your sinuses and nasal passages are lined with a fluid called mucus. Mucus drains out of your sinuses. Swelling can trap mucus in your sinuses. This lets germs (bacteria, virus, or fungus) grow, which leads to infection. Most of the time, this condition is caused by a virus. What are the causes? This condition is caused by:  Allergies.  Asthma.  Germs.  Things that block your nose or sinuses.  Growths in the nose (nasal polyps).  Chemicals or irritants in the air.  Fungus (rare). What increases the risk? You are more likely to develop this condition if:  You have a weak body defense system (immune system).  You do a lot of swimming or diving.  You use nasal sprays too much.  You smoke. What are the signs or symptoms? The main symptoms of this condition are pain and a feeling of pressure around the sinuses. Other symptoms include:  Stuffy nose (congestion).  Runny nose (drainage).  Swelling and warmth in the sinuses.  Headache.  Toothache.  A cough that may get worse at night.  Mucus that collects in the throat or the back of the nose (postnasal drip).  Being unable to smell and taste.  Being very tired (fatigue).  A fever.  Sore throat.  Bad breath. How is this diagnosed? This condition is diagnosed based on:  Your symptoms.  Your medical history.  A physical exam.  Tests to find out if your condition is short-term (acute) or long-term (chronic). Your doctor may: ? Check your nose for  growths (polyps). ? Check your sinuses using a tool that has a light (endoscope). ? Check for allergies or germs. ? Do imaging tests, such as an MRI or CT scan. How is this treated? Treatment for this condition depends on the cause and whether it is short-term or long-term.  If caused by a virus, your symptoms should go away on their own within 10 days. You may be given medicines to relieve symptoms. They include: ? Medicines that shrink swollen tissue in the nose. ? Medicines that treat allergies (antihistamines). ? A spray that treats swelling of the nostrils. ? Rinses that help get rid of thick mucus in your nose (nasal saline washes).  If caused by bacteria, your doctor may wait to see if you will get better without treatment. You may be given antibiotic medicine if you have: ? A very bad infection. ? A weak body defense system.  If caused by growths in the nose, you may need to have surgery. Follow these instructions at home: Medicines  Take, use, or apply over-the-counter and prescription medicines only as told by your doctor. These may include nasal sprays.  If you were prescribed an antibiotic medicine, take it as told by your doctor. Do not stop taking the antibiotic even if you start to feel better. Hydrate and humidify   Drink enough water to keep your pee (urine) pale yellow.  Use a cool mist humidifier to keep the humidity level in your home above 50%.  Breathe in steam for 10-15 minutes, 3-4 times a day, or as told by your doctor. You can do this in the bathroom while a hot shower is running.  Try not to spend time in cool or dry air. Rest  Rest as much as you can.  Sleep with your head raised (elevated).  Make sure you get enough sleep each night. General instructions   Put a warm, moist washcloth on your face 3-4 times a day, or as often as told by your doctor. This will help with discomfort.  Wash your hands often with soap and water. If there is no  soap and water, use hand sanitizer.  Do not smoke. Avoid being around people who are smoking (secondhand smoke).  Keep all follow-up visits as told by your doctor. This is important. Contact a doctor if:  You have a fever.  Your symptoms get worse.  Your symptoms do not get better within 10 days. Get help right away if:  You have a very bad headache.  You cannot stop throwing up (vomiting).  You have very bad pain or swelling around your face or eyes.  You have trouble seeing.  You feel confused.  Your neck is stiff.  You have trouble breathing. Summary  Sinusitis is swelling of your sinuses. Sinuses are hollow spaces in the bones around your face.  This condition is caused by tissues in your nose that become inflamed or swollen. This traps germs. These can lead to infection.  If you were prescribed an antibiotic medicine, take it as told by your doctor. Do not stop taking it even if you start to feel better.  Keep all follow-up visits as told by your doctor. This is important. This information is not intended to replace advice given to you by your health care provider. Make sure you discuss any questions you have with your health care provider. Document Released: 10/01/2007 Document Revised: 09/14/2017 Document Reviewed: 09/14/2017 Elsevier Interactive Patient Education  2019 [employer].

## 2018-05-25 NOTE — Progress Notes (Signed)
Dawn Scott , [birth year], 48 y.o., female MRN: [id number] Patient Care Team    Relationship Specialty Notifications Start End  Ma Hillock, DO PCP - General Family Medicine  06/28/15   Princess Bruins, MD Consulting Physician Obstetrics and Gynecology  06/28/15   Purnell Shoemaker., MD Attending Physician Psychiatry  06/28/15     Chief Complaint  Patient presents with  . Sore Throat    Pt had cold last was better for two days and then started coughing and having congestion again      Subjective: Pt presents for an OV with complaints of sore throat of 1 week duration.  Associated symptoms include he had a cold last week, she thought she felt better for a few days, and then she had a new onset cough that was productive with green phlegm.  She endorses fatigue, nasal congestion, sinus pressure, decreased appetite, feeling achy, low-grade fever. Did have her flu shot this year. Pt has tried VapoRub, ibuprofen to ease their symptoms.   Depression screen PHQ 2/9 06/28/2015  Decreased Interest 1  Down, Depressed, Hopeless 0  PHQ - 2 Score 1    No Known Allergies Social History   Tobacco Use  . Smoking status: Never Smoker  . Smokeless tobacco: Never Used  Substance Use Topics  . Alcohol use: Yes    Comment: very Rare   Past Medical History:  Diagnosis Date  . Anxiety   . Depression   . History of ectopic pregnancy    04/ 2002  S/P  LEFT SALPINGECTOMY  . History of endometriosis   . History of ovarian cyst   . Left ovarian cyst   . Menorrhagia with regular cycle   . PONV (postoperative nausea and vomiting)   . Uterine fibroid    Past Surgical History:  Procedure Laterality Date  . DILATATION & CURETTAGE/HYSTEROSCOPY WITH MYOSURE N/A 10/30/2016   Procedure: , REMOVAL IUD, HYSTEROSCOPY, DILATION AND CURETTAGE;  Surgeon: Princess Bruins, MD;  Location: [place] [facility];  Service: Gynecology;  Laterality: N/A;  . DILATION AND EVACUATION  03/10/2003   dr Dellis Filbert     retained placental tissue postpartum  . DX LAPAROSCOPY/ LYSIS ADHESIONAS/  FULGERATION ENDOMETRIOSIS/  LEFT SALPINGECTOMY WITH REMOVAL ECOTPIC  08/18/2000   dr Ronita Hipps  . HYSTEROSCOPY WITH NOVASURE N/A 10/30/2016   Procedure: HYSTEROSCOPY WITH NOVASURE;  Surgeon: Princess Bruins, MD;  Location: [facility];  Service: Gynecology;  Laterality: N/A;  . LAPAROSCOPIC APPENDECTOMY  11/30/2007  . SEPTOPLASTY  2009/08/14  . TONSILLECTOMY AND ADENOIDECTOMY  child  . WISDOM TOOTH EXTRACTION  1996   Family History  Problem Relation Age of Onset  . Depression Mother   . Arthritis Mother   . Heart disease Mother   . Transient ischemic attack Mother   . Alcohol abuse Father   . Depression Father   . Diabetes Father   . Alcohol abuse Maternal Grandfather   . Leukemia Maternal Grandfather   . Early death Maternal Grandfather        leukemia  . Breast cancer Maternal Aunt Aug 14, 2068  . Early death Maternal Aunt        cancer  . Depression Paternal Uncle   . Transient ischemic attack Maternal Grandmother   . Arthritis Paternal Grandmother   . Dementia Paternal Grandmother   . Alcohol abuse Paternal Grandfather   . Early death Paternal Grandfather    Allergies as of 05/25/2018   No Known Allergies  Medication List       Accurate as of May 25, 2018 11:23 AM. Always use your most recent med list.        B-12 PO Take 1 tablet by mouth every morning.   Biotin 1 MG Caps Take by mouth.   buPROPion 150 MG 24 hr tablet Commonly known as:  WELLBUTRIN XL TAKE 1 TABLET BY MOUTH EVERY MORNING   DULoxetine 30 MG capsule Commonly known as:  CYMBALTA Take 30 mg by mouth every morning. Takes w/ 60mg  cap. For total 90 mg   DULoxetine 60 MG capsule Commonly known as:  CYMBALTA Take 60 mg by mouth every morning. Patient takes a total of 90 mg daily   lamoTRIgine 150 MG tablet Commonly known as:  LAMICTAL Take 150 mg by mouth every evening.   loratadine 10 MG tablet Commonly  known as:  CLARITIN Take 10 mg by mouth every evening.   multivitamin with minerals tablet Take 1 tablet by mouth every evening.   Vitamin D3 50 MCG (2000 UT) Tabs Take 1 tablet by mouth every morning.       All past medical history, surgical history, allergies, family history, immunizations andmedications were updated in the EMR today and reviewed under the history and medication portions of their EMR.     ROS: Negative, with the exception of above mentioned in HPI   Objective:  BP 122/71 (BP Location: Right Arm, Patient Position: Sitting, Cuff Size: Normal)   Pulse 93   Temp 98.5 F (36.9 C) (Oral)   Resp 16   Ht 5\' 9"  (1.753 m)   Wt 199 lb (90.3 kg)   LMP 04/30/2018   SpO2 97%   BMI 29.39 kg/m  Body mass index is 29.39 kg/m. Gen: Afebrile. No acute distress. Nontoxic in appearance, well developed, well nourished.  HENT: AT. [place]. Bilateral TM visualized with bilateral fullness, no erythema. MMM, no oral lesions. Bilateral nares with erythema, swelling and drainage. Throat without erythema or exudates.  Cough present.  Hoarseness present.  Tender to palpation maxillary sinus. Eyes:Pupils Equal Round Reactive to light, Extraocular movements intact,  Conjunctiva without redness, discharge or icterus. Neck/lymp/endocrine: Supple, no lymphadenopathy CV: RRR  Chest: CTAB, no wheeze or crackles. Skin: No rashes, purpura or petechiae.  Neuro:  Normal gait. PERLA. EOMi. Alert. Oriented x3    No exam data present No results found. No results found for this or any previous visit (from the past 24 hour(s)).  Assessment/Plan: Dawn Scott is a 48 y.o. female present for OV for  Acute non-recurrent maxillary sinusitis Rest, hydrate. Start  flonase, mucinex (DM if cough) augmentin prescribed, take until completed.  If cough present it can last up to 6-8 weeks.  Tessalon perles for cough Work note for today and tomorrow.  F/U 2 weeks of not improved.    Reviewed expectations  re: course of current medical issues.  Discussed self-management of symptoms.  Outlined signs and symptoms indicating need for more acute intervention.  Patient verbalized understanding and all questions were answered.  Patient received an After-Visit Summary.    No orders of the defined types were placed in this encounter.    Note is dictated utilizing voice recognition software. Although note has been proof read prior to signing, occasional typographical errors still can be missed. If any questions arise, please do not hesitate to call for verification.   electronically signed by:  Howard Pouch, DO  [place]

## 2018-05-26 ENCOUNTER — Encounter: Payer: Self-pay | Admitting: Family Medicine

## 2018-05-27 ENCOUNTER — Ambulatory Visit: Payer: Self-pay | Admitting: *Deleted

## 2018-05-27 NOTE — Telephone Encounter (Signed)
Message from Esaw Dace sent at 05/27/2018 9:36 AM EST   Summary: Medication Reaction   Pt stated she was given amoxicillin-clavulanate (AUGMENTIN) 875-125 MG tablet and after there first 3 doses she was sick on her stomach. She would like to know if an alternative can be given. Please advise. CB# [id number]          Pt called with having diarrhea after taking the first 3 doses of Augmentin. She has not taken any more since yesterday. She has also had some nausea. Flow at [facility] notified per protocol. Pt requesting a call back.  Reason for Disposition . Caller has URGENT medication question about med that PCP prescribed and triager unable to answer question  Answer Assessment - Initial Assessment Questions 1. SYMPTOMS: "Do you have any symptoms?"     Yes diarrhea 2. SEVERITY: If symptoms are present, ask "Are they mild, moderate or severe?"     severe  Protocols used: MEDICATION QUESTION CALL-A-AH

## 2018-05-27 NOTE — Telephone Encounter (Signed)
Pt complains of having loose stools, 7 times yesterday. No blood in stool. She does have nausea but no vomiting. She is eating a meal before taking abx and drinking plenty of water. She had C-Diff a couple of years back and is afraid she will get it again. She is asking for something else to be called in.  She took medication yesterday morning and has not taken any since.   Please advise

## 2018-05-27 NOTE — Telephone Encounter (Signed)
Called pt and advised her that Dr. Raoul Pitch is out of the office today but we did send her message to Dr. Anitra Lauth for review.   I advised her of Dr. Idelle Leech recommendation and she voiced understanding but declined to schedule an apt due to missing so much work already.  She is going to just stay off the Augmentin and continue the otc medications that were recommended by Dr. Raoul Pitch. She will call for apt is she does not improve or if symptoms worsen.

## 2018-05-27 NOTE — Telephone Encounter (Signed)
No meds. Must be seen before any consideration of med rx's.

## 2018-06-14 ENCOUNTER — Ambulatory Visit: Payer: Self-pay

## 2018-06-14 ENCOUNTER — Other Ambulatory Visit: Payer: Self-pay | Admitting: Physician Assistant

## 2018-06-14 NOTE — Telephone Encounter (Signed)
Contacted patient and tried to get her to come in when Dr Raoul Pitch had a 30 minute appointment available but patient states she has no more Vacation / personal time left at work so she had to take the 8:30am appointment.  I put a block on the 9:15am slot for Dr Raoul Pitch to hopefully not get behind as this should have been scheduled as a 58minute appointment.  FYI appointment scheduled 06/15/2018 @ 8:30am.

## 2018-06-14 NOTE — Telephone Encounter (Signed)
Pt. Reports she was recently on an antibiotic for sinus infection. After she finished the antibiotic, she noticed a lot mucus in her stools and some blood in the mucus. Has some abdominal pain - worse with dairy and hurts with BM's. Has been on a BRAT diet. Reports she has had C-diff in the past.Appointment made for tomorrow. Instructed if symptoms worsen to go to ED. Verbalizes understanding.  Reason for Disposition . MODERATE rectal bleeding (small blood clots, passing blood without stool, or toilet water turns red)  Answer Assessment - Initial Assessment Questions 1. APPEARANCE of BLOOD: "What color is it?" "Is it passed separately, on the surface of the stool, or mixed in with the stool?"      Seeing small amount of blood in the mucus 2. AMOUNT: "How much blood was passed?"      Small amount 3. FREQUENCY: "How many times has blood been passed with the stools?"      With each stool 4. ONSET: "When was the blood first seen in the stools?" (Days or weeks)       1 week ago 5. DIARRHEA: "Is there also some diarrhea?" If so, ask: "How many diarrhea stools were passed in past 24 hours?"      Diarrhea when she started the antibiotic 6. CONSTIPATION: "Do you have constipation?" If so, "How bad is it?"     No 7. RECURRENT SYMPTOMS: "Have you had blood in your stools before?" If so, ask: "When was the last time?" and "What happened that time?"      No 8. BLOOD THINNERS: "Do you take any blood thinners?" (e.g., Coumadin/warfarin, Pradaxa/dabigatran, aspirin)     No 9. OTHER SYMPTOMS: "Do you have any other symptoms?"  (e.g., abdominal pain, vomiting, dizziness, fever)     Abdominal pain is worse with BM 10. PREGNANCY: "Is there any chance you are pregnant?" "When was your last menstrual period?"       No  Protocols used: RECTAL BLEEDING-A-AH

## 2018-06-15 ENCOUNTER — Encounter: Payer: Self-pay | Admitting: Family Medicine

## 2018-06-15 ENCOUNTER — Ambulatory Visit: Payer: 59 | Admitting: Family Medicine

## 2018-06-15 VITALS — BP 132/91 | HR 82 | Temp 98.6°F | Resp 12 | Ht 69.0 in | Wt 205.0 lb

## 2018-06-15 DIAGNOSIS — A09 Infectious gastroenteritis and colitis, unspecified: Secondary | ICD-10-CM | POA: Diagnosis not present

## 2018-06-15 DIAGNOSIS — R195 Other fecal abnormalities: Secondary | ICD-10-CM

## 2018-06-15 DIAGNOSIS — K921 Melena: Secondary | ICD-10-CM | POA: Diagnosis not present

## 2018-06-15 LAB — POC HEMOCCULT BLD/STL (OFFICE/1-CARD/DIAGNOSTIC)
Card #1 Date: 2182020
FECAL OCCULT BLD: NEGATIVE

## 2018-06-15 MED ORDER — VANCOMYCIN HCL 125 MG PO CAPS: 125.0000 mg | ORAL_CAPSULE | Freq: Four times a day (QID) | ORAL | 0 refills | Status: DC

## 2018-06-15 NOTE — Patient Instructions (Signed)
Please complete C.Diff. test If positive start the script provided to you today, every 6 hours for 10 days.   Make sure to take probiotic.   You did have a small internal hemorrhoid, no blood per rectum today. You also had 2 very small external hemorrhoids.   If C.Diff test normal and mucous stools continue will need to complete further stool studies.     Clostridium Difficile Infection Clostridium difficile (C. difficile or C. diff) infection is a condition that occurs when an overgrowth of C. diff bacteria causes irritation and swelling (inflammation) of the colon (colitis). This infection can be passed from person to person (can be contagious). You may also be exposed to the bacteria from the environment, such as from food or water or from touching surfaces that have the bacteria on them. What are the causes? Certain bacteria normally live in the colon and help to digest food. This infection develops when the balance of bacteria in the colon is changed and C. diff grows out of control. This is often caused by taking antibiotics. What increases the risk? This condition is more likely to develop in people who:  Need to take antibiotics for a long period of time.  Take certain antibiotics that kill a wide range of bacteria.  Are in the hospital.  Are older.  Live in a place where there is a lot of contact with others, such as a nursing home.  Have had a C. diff infection before.  Have a weak defense (immune) system.  Take a medicine called proton pump inhibitors over a long period of time (chronic use).  Have serious underlying conditions, such as colon cancer.  Have had gastrointestinal (GI) tract procedure or surgery. What are the signs or symptoms? Symptoms of this condition include:  Diarrhea. This may be bloody, watery, or yellow or green in color.  Fever.  Fatigue.  Loss of appetite.  Nausea.  Swelling, pain, or tenderness in the abdomen. How is this  diagnosed? This condition is diagnosed with medical history and physical exam. You may also have other tests, including:  A test that checks for C. diff in your stool.  Blood tests.  Imaging tests, such as a CT scan of your abdomen. In rare cases, a sigmoidoscopy or colonoscopy may be used to look directly into your colon. These procedures involve passing an instrument through your rectum to look at the inside of your colon. How is this treated? Treatment for this condition includes:  Stopping the antibiotics that you were on when the C. diff infection began. Do this only as told by your health care provider.  Taking certain antibiotics to stop C. diff from growing.  Taking donor stool from a healthy person and placing it into the colon (fecal transplant) through a colonoscope or nasogastric tube. This may be done if you have a recurrent infection.  Having surgery to remove the infected part of the colon (rare). Follow these instructions at home: Eating and drinking   Eat bland, easy-to-digest foods in small amounts as you are able. These foods include bananas, applesauce, rice, lean meats, toast, and crackers.  Follow specific instructions on how to get enough water into your body (rehydrate). This may include: ? Drinking clear fluids, such as water, ice chips, diluted fruit juice, and low-calorie sports drinks. ? Taking an oral rehydration solution (ORS). This is a drink that is sold at pharmacies and retail stores.  Avoid milk, caffeine, and alcohol.  Drink enough fluid to keep  your urine pale yellow. General instructions  Take over-the-counter and prescription medicines only as told by your health care provider.  Take your antibiotic medicine as told by your health care provider. Do not stop taking the antibiotic, even if you start to feel better. You may stop taking it only if your health care provider tells you to stop.  Wash your hands with soap and water. If soap and  water are not available, use hand sanitizer.  Do not use medicines to help with diarrhea.  Keep all follow-up visits as told by your health care provider. This is important. How is this prevented? Hand hygiene   Use soap and water to wash your hands thoroughly before you prepare food and after you use the bathroom. Make sure that people who live with you also wash their hands often.  If you are being treated at a hospital or clinic, make sure that all health care providers wash their hands with soap and water before touching you.  If you are in the hospital, make sure that all visitors wash their hands with soap and water before touching you. Contact precautions  If you develop diarrhea while in the hospital or a long-term care facility, let your health care team know right away.  When visiting someone in the hospital or a long-term care facility, follow guidelines for wearing a gown, gloves, or other protective equipment.  If possible, avoid contact with people who have diarrhea. Clean environment  Clean surfaces with a product that contains chlorine bleach.  If you are in the hospital, make sure that staff members clean the surfaces in your room daily. Let a staff person know right away if body fluids have splashed or spilled in your room. Contact a health care provider if:  Your symptoms do not get better with treatment.  Your symptoms get worse, even with treatment.  Your symptoms go away and then come back.  You have a fever.  You have new symptoms. Get help right away if:  You have more pain or tenderness in your abdomen.  You have stool that is mostly bloody, or your stool looks dark black and tarry.  You cannot eat or drink without vomiting.  You have signs or symptoms of dehydration, such as: ? Dark urine, very little urine, or no urine. ? Cracked lips. ? Not making tears when you cry. ? Dry mouth. ? Sunken  eyes. ? Sleepiness. ? Weakness. ? Dizziness. Summary  Clostridium difficile (C. difficile or C. diff) infection is a condition that occurs when an overgrowth of C. diff bacteria causes irritation and swelling (inflammation) of the colon.  Symptoms of C. diff infection include diarrhea, fever, fatigue, loss of appetite, nausea, and swelling, pain, or tenderness in the abdomen.  C. diff infection is treated by stopping the antibiotics you were on when the C. diff infection began, and then taking certain antibiotics to keep C. diff from growing. In some cases, fecal transplant or surgery is performed.  Hand washing with soap and water, contact precautions, and a clean environment can help prevent or limit the spread of C. diff infection. This information is not intended to replace advice given to you by your health care provider. Make sure you discuss any questions you have with your health care provider. Document Released: 01/22/2005 Document Revised: 01/21/2017 Document Reviewed: 01/21/2017 Elsevier Interactive Patient Education  2019 [employer].

## 2018-06-15 NOTE — Progress Notes (Signed)
Dawn Scott , May 02, 1970, 48 y.o., female MRN: [id number] Patient Care Team    Relationship Specialty Notifications Start End  Dawn Hillock, DO PCP - General Family Medicine  06/28/15   Dawn Bruins, MD Consulting Physician Obstetrics and Gynecology  06/28/15   Dawn Scott., MD Attending Physician Psychiatry  06/28/15     Chief Complaint  Patient presents with  . blood in stool     Subjective: Pt presents for an OV with complaints of mucous stools of 1 weeks duration.  Associated symptoms include very mild blood tinge in stools that she feels is rectal, but possibly in her urine as well. She denies fever, chills, dysuria. She endorses loose stools and diarrhea. She states her stool also has mucous within and some blood. She has had some abdominal cramping, bloating and mil nausea, especially RLQ. Pt had a positive C.diff about 2 years ago. She had recently been treated for a sinus infection with Augmentin prior to onset of symptoms. She did start kefir over this time as well.   Beginning of feb Lmp- normal.    Depression screen PHQ 2/9 06/28/2015  Decreased Interest 1  Down, Depressed, Hopeless 0  PHQ - 2 Score 1    No Known Allergies Social History   Social History Narrative   Widower. Husband died on the job July 26, 2011.   Bachelor of fine arts, currently not working    One child, Designer, jewellery.   No tobacco or drug use, rare use of alcohol.   Drinks caffeinated beverages, takes a daily vitamin   Wears her seatbelt, wears a bicycle, smoke detectors in the home   exercise at least 3 times a week.   Feels safe in her relationships.   Past Medical History:  Diagnosis Date  . Anxiety   . Depression   . History of ectopic pregnancy    04/ 2002  S/P  LEFT SALPINGECTOMY  . History of endometriosis   . History of ovarian cyst   . Left ovarian cyst   . Menorrhagia with regular cycle   . PONV (postoperative nausea and vomiting)   . Uterine fibroid    Past Surgical History:    Procedure Laterality Date  . DILATATION & CURETTAGE/HYSTEROSCOPY WITH MYOSURE N/A 10/30/2016   Procedure: , REMOVAL IUD, HYSTEROSCOPY, DILATION AND CURETTAGE;  Surgeon: Dawn Bruins, MD;  Location: [facility];  Service: Gynecology;  Laterality: N/A;  . DILATION AND EVACUATION  03/10/2003   dr Dawn Scott   retained placental tissue postpartum  . DX LAPAROSCOPY/ LYSIS ADHESIONAS/  FULGERATION ENDOMETRIOSIS/  LEFT SALPINGECTOMY WITH REMOVAL ECOTPIC  08/18/2000   dr Dawn Scott  . HYSTEROSCOPY WITH NOVASURE N/A 10/30/2016   Procedure: HYSTEROSCOPY WITH NOVASURE;  Surgeon: Dawn Bruins, MD;  Location: [facility];  Service: Gynecology;  Laterality: N/A;  . LAPAROSCOPIC APPENDECTOMY  11/30/2007  . SEPTOPLASTY  07-25-2009  . TONSILLECTOMY AND ADENOIDECTOMY  child  . WISDOM TOOTH EXTRACTION  1996   Family History  Problem Relation Age of Onset  . Depression Mother   . Arthritis Mother   . Heart disease Mother   . Transient ischemic attack Mother   . Alcohol abuse Father   . Depression Father   . Diabetes Father   . Alcohol abuse Maternal Grandfather   . Leukemia Maternal Grandfather   . Early death Maternal Grandfather        leukemia  . Breast cancer Maternal Aunt 07-25-2068  . Early death Maternal Aunt  cancer  . Depression Paternal Uncle   . Transient ischemic attack Maternal Grandmother   . Arthritis Paternal Grandmother   . Dementia Paternal Grandmother   . Alcohol abuse Paternal Grandfather   . Early death Paternal Grandfather    Allergies as of 06/15/2018   No Known Allergies     Medication List       Accurate as of June 15, 2018  8:43 AM. Always use your most recent med list.        B-12 PO Take 1 tablet by mouth every morning.   benzonatate 200 MG capsule Commonly known as:  TESSALON Take 1 capsule (200 mg total) by mouth 2 (two) times daily as needed for cough.   Biotin 1 MG Caps Take by mouth.   buPROPion 150 MG 24 hr  tablet Commonly known as:  WELLBUTRIN XL TAKE 1 TABLET BY MOUTH EVERY MORNING   DULoxetine 30 MG capsule Commonly known as:  CYMBALTA Take 30 mg by mouth every morning. Takes w/ 60mg  cap. For total 90 mg   DULoxetine 60 MG capsule Commonly known as:  CYMBALTA Take 60 mg by mouth every morning. Patient takes a total of 90 mg daily   lamoTRIgine 150 MG tablet Commonly known as:  LAMICTAL Take 150 mg by mouth every evening.   loratadine 10 MG tablet Commonly known as:  CLARITIN Take 10 mg by mouth every evening.   multivitamin with minerals tablet Take 1 tablet by mouth every evening.   Vitamin D3 50 MCG (2000 UT) Tabs Take 1 tablet by mouth every morning.       All past medical history, surgical history, allergies, family history, immunizations andmedications were updated in the EMR today and reviewed under the history and medication portions of their EMR.     ROS: Negative, with the exception of above mentioned in HPI   Objective:  BP (!) 132/91 (BP Location: Right Arm, Patient Position: Sitting, Cuff Size: Normal)   Pulse 82   Temp 98.6 F (37 C) (Oral)   Resp 12   Ht 5\' 9"  (1.753 m)   Wt 205 lb (93 kg)   SpO2 100%   BMI 30.27 kg/m  Body mass index is 30.27 kg/m. Gen: Afebrile. No acute distress. Nontoxic in appearance, well developed, well nourished.  HENT: AT. . MMM Eyes:Pupils Equal Round Reactive to light, Extraocular movements intact,  Conjunctiva without redness, discharge or icterus. Neck/lymp/endocrine: Supple,no lymphadenopathy CV: RRR no murmur Chest: CTAB, no wheeze or crackles. Good air movement, normal resp effort.  Abd: Soft. obese. NTND. BS hyperactive. no Masses palpated. No rebound or guarding.  Skin: no rashes, purpura or petechiae.  Neuro:  Normal gait. PERLA. EOMi. Alert. Oriented x3  Rectal exam: negative without mass, lesions or tenderness, internal hemorrhoids noted, external hemorrhoids noted.  No exam data present No results  found. No results found for this or any previous visit (from the past 24 hour(s)).  Assessment/Plan: Dawn Scott is a 48 y.o. female present for OV for  Bloody stool/mucous stools/diarrhea - possibly C.diff after abx use, although can not be certain. She does have hemorrhoids, which do not appear to be the cause wither. FOBT is negative today.  - C. Diff PCR ordered, vanc script printed in the event it is positive she will start.  - vancomycin (VANCOCIN HCL) 125 MG capsule; Take 1 capsule (125 mg total) by mouth 4 (four) times daily.  Dispense: 40 capsule; Refill: 0 - POC Hemoccult Bld/Stl (1-Cd Office Dx) -  Clostridium difficile Toxin B, Qualitative, Real-Time PCR([facility]); Future - Stop Kefir use if C.diff negative- many of those symptoms could be caused by Kefir.  - F/U c.diff negative nad stopping Kefir does not solve issue, follow up in 2 weeks.     Reviewed expectations re: course of current medical issues.  Discussed self-management of symptoms.  Outlined signs and symptoms indicating need for more acute intervention.  Patient verbalized understanding and all questions were answered.  Patient received an After-Visit Summary.    No orders of the defined types were placed in this encounter.  > 25 minutes spent with patient, >50% of time spent face to face counseling    Note is dictated utilizing voice recognition software. Although note has been proof read prior to signing, occasional typographical errors still can be missed. If any questions arise, please do not hesitate to call for verification.   electronically signed by:  Howard Pouch, DO  [place]

## 2018-06-15 NOTE — Addendum Note (Signed)
Addended by: Deveron Furlong D on: 06/15/2018 04:33 PM   Modules accepted: Orders

## 2018-06-16 ENCOUNTER — Telehealth: Payer: Self-pay | Admitting: Family Medicine

## 2018-06-16 LAB — CLOSTRIDIUM DIFFICILE TOXIN B, QUALITATIVE, REAL-TIME PCR: Toxigenic C. Difficile by PCR: DETECTED — AB

## 2018-06-16 NOTE — Telephone Encounter (Signed)
Left message for pt. To call back for her results.

## 2018-06-16 NOTE — Telephone Encounter (Signed)
Copied from [place] [id number]. Topic: Quick Communication - Lab Results (Clinic Use ONLY) >> Jun 16, 2018  1:28 PM Ralph Dowdy, CMA wrote: Called patient to inform them of positive C diff lab results. When patient returns call, triage nurse may disclose results. >> Jun 16, 2018  1:59 PM Bea Graff, NT wrote: Pt returning call to receive lab results.

## 2018-06-17 NOTE — Telephone Encounter (Signed)
Dawn Scott, from [facility] calling to report that the pt had a C-Diff test collected on 06/15/18 and the test came back showing that c-diff was detected. Also See MyChart message from 06/16/18 and result  Note.

## 2018-07-09 ENCOUNTER — Other Ambulatory Visit: Payer: Self-pay | Admitting: Physician Assistant

## 2018-07-09 NOTE — Telephone Encounter (Signed)
Has appt Monday 03/16 can get then

## 2018-07-12 ENCOUNTER — Telehealth: Payer: Self-pay

## 2018-07-12 ENCOUNTER — Encounter: Payer: Self-pay | Admitting: Physician Assistant

## 2018-07-12 ENCOUNTER — Ambulatory Visit: Payer: Self-pay | Admitting: Family Medicine

## 2018-07-12 ENCOUNTER — Ambulatory Visit: Payer: Self-pay | Admitting: Physician Assistant

## 2018-07-12 ENCOUNTER — Other Ambulatory Visit: Payer: Self-pay

## 2018-07-12 DIAGNOSIS — F331 Major depressive disorder, recurrent, moderate: Secondary | ICD-10-CM

## 2018-07-12 DIAGNOSIS — G4700 Insomnia, unspecified: Secondary | ICD-10-CM | POA: Diagnosis not present

## 2018-07-12 DIAGNOSIS — F411 Generalized anxiety disorder: Secondary | ICD-10-CM

## 2018-07-12 MED ORDER — DULOXETINE HCL 60 MG PO CPEP: 60.0000 mg | ORAL_CAPSULE | Freq: Every day | ORAL | 1 refills | Status: DC

## 2018-07-12 MED ORDER — DULOXETINE HCL 30 MG PO CPEP: 30.0000 mg | ORAL_CAPSULE | Freq: Every morning | ORAL | 1 refills | Status: DC

## 2018-07-12 MED ORDER — LAMOTRIGINE 150 MG PO TABS: 150.0000 mg | ORAL_TABLET | Freq: Every evening | ORAL | 1 refills | Status: DC

## 2018-07-12 MED ORDER — BUPROPION HCL ER (XL) 150 MG PO TB24: 150.0000 mg | ORAL_TABLET | Freq: Every day | ORAL | 1 refills | Status: DC

## 2018-07-12 NOTE — Progress Notes (Signed)
[facility] Med Check  Patient ID: Dawn Scott,  MRN: [id number]  PCP: Ma Hillock, DO  Date of Evaluation: 07/12/2018 Time spent:15 minutes  Chief Complaint:  Chief Complaint    Follow-up      HISTORY/CURRENT STATUS: HPI Here for routine med check.  Doing really well.  Patient denies loss of interest in usual activities and is able to enjoy things.  Denies decreased energy or motivation.  Appetite has not changed.  No extreme sadness, tearfulness, or feelings of hopelessness.  Denies any changes in concentration, making decisions or remembering things.  Denies suicidal or homicidal thoughts.  Patient denies increased energy with decreased need for sleep, no increased talkativeness, no racing thoughts, no impulsivity or risky behaviors, no increased spending, no increased libido, no grandiosity.  Her daughter is now 25-1/2 years old and doing well.  Patient's job is going fine.  She might have to start working from home because the coronavirus but unsure.  She sleeps well.  Denies muscle or joint pain, stiffness, or dystonia.  Denies dizziness, syncope, seizures, numbness, tingling, tremor, tics, unsteady gait, slurred speech, confusion.   Individual Medical History/ Review of Systems: Changes? :No    Past medications for mental health diagnoses include: Klonopin, Prozac, Prosom, Ambien, Cymbalta, Wellbutrin, Lamictal   Allergies: Patient has no known allergies.  Current Medications:  Current Outpatient Medications:  .  Biotin 1 MG CAPS, Take by mouth., Disp: , Rfl:  .  buPROPion (WELLBUTRIN XL) 150 MG 24 hr tablet, Take 1 tablet (150 mg total) by mouth daily., Disp: 90 tablet, Rfl: 1 .  Cholecalciferol (VITAMIN D3) 2000 units TABS, Take 1 tablet by mouth every morning., Disp: , Rfl:  .  Cyanocobalamin (B-12 PO), Take 1 tablet by mouth every morning. , Disp: , Rfl:  .  DULoxetine (CYMBALTA) 30 MG capsule, Take 1 capsule (30 mg total) by mouth every morning. Takes w/  60mg  cap. For total 90 mg, Disp: 90 capsule, Rfl: 1 .  DULoxetine (CYMBALTA) 60 MG capsule, Take 1 capsule (60 mg total) by mouth daily. Patient takes a total of 90 mg daily, Disp: 90 capsule, Rfl: 1 .  lamoTRIgine (LAMICTAL) 150 MG tablet, Take 1 tablet (150 mg total) by mouth every evening., Disp: 90 tablet, Rfl: 1 .  loratadine (CLARITIN) 10 MG tablet, Take 10 mg by mouth every evening. , Disp: , Rfl:  .  Multiple Vitamins-Minerals (MULTIVITAMIN WITH MINERALS) tablet, Take 1 tablet by mouth every evening. , Disp: , Rfl:  .  benzonatate (TESSALON) 200 MG capsule, Take 1 capsule (200 mg total) by mouth 2 (two) times daily as needed for cough. (Patient not taking: Reported on 06/15/2018), Disp: 20 capsule, Rfl: 0 .  vancomycin (VANCOCIN HCL) 125 MG capsule, Take 1 capsule (125 mg total) by mouth 4 (four) times daily. (Patient not taking: Reported on 07/12/2018), Disp: 40 capsule, Rfl: 0 Medication Side Effects: none  Family Medical/ Social History: Changes? Yes Moved into a new home last fall.   MENTAL HEALTH EXAM:  There were no vitals taken for this visit.There is no height or weight on file to calculate BMI.  General Appearance: Casual and Well Groomed  Eye Contact:  Good  Speech:  Clear and Coherent  Volume:  Normal  Mood:  Euthymic  Affect:  Appropriate  Thought Process:  Goal Directed  Orientation:  Full (Time, Place, and Person)  Thought Content: Logical   Suicidal Thoughts:  No  Homicidal Thoughts:  No  Memory:  WNL  Judgement:  Good  Insight:  Good  Psychomotor Activity:  Normal  Concentration:  Concentration: Good  Recall:  Good  Fund of Knowledge: Good  Language: Good  Assets:  Desire for Improvement  ADL's:  Intact  Cognition: WNL  Prognosis:  Good    DIAGNOSES:    ICD-10-CM   1. Major depressive disorder, recurrent episode, moderate (HCC) F33.1   2. Generalized anxiety disorder F41.1   3. Insomnia, unspecified type G47.00     Receiving Psychotherapy: No     RECOMMENDATIONS: Continue Wellbutrin XL 150 mg 1 p.o. every morning. Continue Lamictal 150 mg 1 p.o. nightly. Continue Cymbalta 60 mg +30 mg to equal 90 mg every morning. Return in 6 months or sooner as needed.   Donnal Moat, PA-C   This record has been created using [employer].  Chart creation errors have been sought, but may not always have been located and corrected. Such creation errors do not reflect on the standard of medical care.

## 2018-07-12 NOTE — Telephone Encounter (Signed)
Pt called stating she cancelled her 07/14/2018 appt due to  The Coronovirus and did not want to come to the MD if not necessary. She states she is feeling 100% better with no s/s of C-Diff. Pt would like for MD to advise if she really needs to come in or if this will be okay to wait.

## 2018-07-13 NOTE — Telephone Encounter (Signed)
Pt advised and voiced understanding.    Apt made for 08/02/18 at 3:00pm.

## 2018-07-13 NOTE — Telephone Encounter (Signed)
Left message for pt to call back  °

## 2018-07-13 NOTE — Telephone Encounter (Signed)
Please inform patient the following information: She does not need to come in now if she is feeling better. I would still recommend a follow up in 4 weeks after COVID19 precautions calm down. She should have a retest to ensure complete resolution of C.Diff in the stool- if not completely eradicated--> it can slowly return.

## 2018-07-14 ENCOUNTER — Ambulatory Visit: Payer: Self-pay | Admitting: Family Medicine

## 2018-08-02 ENCOUNTER — Ambulatory Visit: Payer: Self-pay | Admitting: Family Medicine

## 2018-08-03 ENCOUNTER — Other Ambulatory Visit: Payer: Self-pay

## 2018-08-03 ENCOUNTER — Encounter: Payer: Self-pay | Admitting: Family Medicine

## 2018-08-03 ENCOUNTER — Ambulatory Visit (INDEPENDENT_AMBULATORY_CARE_PROVIDER_SITE_OTHER): Payer: 59 | Admitting: Family Medicine

## 2018-08-03 VITALS — BP 129/83 | HR 90 | Temp 98.5°F | Ht 69.0 in | Wt 202.2 lb

## 2018-08-03 DIAGNOSIS — A0472 Enterocolitis due to Clostridium difficile, not specified as recurrent: Secondary | ICD-10-CM

## 2018-08-03 HISTORY — DX: Enterocolitis due to Clostridium difficile, not specified as recurrent: A04.72

## 2018-08-03 NOTE — Progress Notes (Signed)
Virtual Visit via Video   I connected with Dawn Scott on 08/03/18 at 10:00 AM EDT by a video enabled telemedicine application and verified that I am speaking with the correct person using two identifiers. Location patient: Home Location provider: [facility], Office Persons participating in the virtual visit: Patient, Dr. Raoul Pitch and R.Baker, LPN  I discussed the limitations of evaluation and management by telemedicine and the availability of in person appointments. The patient expressed understanding and agreed to proceed.  Subjective:   Chief Complaint  Patient presents with  . Follow-up    F/U C-Diff, Pt states she is feeling great, taking probiotics daily     HPI:  Patient presents today to discuss her C. difficile colitis.  She has completed the vancomycin treatment.  She reports she has also stopped the Kefir and added probiotics to her diet.  She states her symptoms have completely resolved she no longer has diarrhea, fever or abdominal cramping/pain.  Prior note:  Pt presents for an OV with complaints of mucous stools of 1 weeks duration.  Associated symptoms include very mild blood tinge in stools that she feels is rectal, but possibly in her urine as well. She denies fever, chills, dysuria. She endorses loose stools and diarrhea. She states her stool also has mucous within and some blood. She has had some abdominal cramping, bloating and mil nausea, especially RLQ. Pt had a positive C.diff about 2 years ago. She had recently been treated for a sinus infection with Augmentin prior to onset of symptoms. She did start kefir over this time as well.   Beginning of feb Lmp- normal.  ROS: See pertinent positives and negatives per HPI.  Patient Active Problem List   Diagnosis Date Noted  . GAD (generalized anxiety disorder) 04/01/2018  . Bloody stool 07/07/2016  . Endometriosis 06/28/2015  . Vitamin D deficiency 06/28/2015  . Depression 06/16/2012  . Insomnia 06/12/2012     Social History   Tobacco Use  . Smoking status: Never Smoker  . Smokeless tobacco: Never Used  Substance Use Topics  . Alcohol use: Yes    Comment: very Rare    Current Outpatient Medications:  .  Biotin 1 MG CAPS, Take by mouth., Disp: , Rfl:  .  buPROPion (WELLBUTRIN XL) 150 MG 24 hr tablet, Take 1 tablet (150 mg total) by mouth daily., Disp: 90 tablet, Rfl: 1 .  Cholecalciferol (VITAMIN D3) 2000 units TABS, Take 1 tablet by mouth every morning., Disp: , Rfl:  .  Cyanocobalamin (B-12 PO), Take 1 tablet by mouth every morning. , Disp: , Rfl:  .  DULoxetine (CYMBALTA) 30 MG capsule, Take 1 capsule (30 mg total) by mouth every morning. Takes w/ 60mg  cap. For total 90 mg, Disp: 90 capsule, Rfl: 1 .  DULoxetine (CYMBALTA) 60 MG capsule, Take 1 capsule (60 mg total) by mouth daily. Patient takes a total of 90 mg daily, Disp: 90 capsule, Rfl: 1 .  lamoTRIgine (LAMICTAL) 150 MG tablet, Take 1 tablet (150 mg total) by mouth every evening., Disp: 90 tablet, Rfl: 1 .  loratadine (CLARITIN) 10 MG tablet, Take 10 mg by mouth every evening. , Disp: , Rfl:  .  Multiple Vitamins-Minerals (MULTIVITAMIN WITH MINERALS) tablet, Take 1 tablet by mouth every evening. , Disp: , Rfl:   No Known Allergies  Objective:  There were no vitals taken for this visit. Gen: No acute distress. Nontoxic in appearance.  HENT: AT. [place].  MMM.  Eyes: Conjunctiva without redness,  discharge or icterus. Chest: Cough or shortness of breath not present.  Skin: no rashes, purpura or petechiae.  Neuro:  Alert. Oriented x3  Psych: Normal affect, dress and demeanor. Normal speech. Normal thought content and judgment.   Assessment and Plan:  Dawn Scott is a 48 y.o. present  - symptoms resolved.  - C.diff PCR ordered for verification of complete treatment of C. difficile since this is her second episode in just a few months.  - continue probiotic.  - F/U PRN  > 15 minutes spent with patient, >50% of time spent face  to face counseling     Howard Pouch, DO 08/03/2018

## 2018-08-03 NOTE — Patient Instructions (Signed)
C.diff ordered to be picked up and dropped off during lab times.    Clostridium Difficile Infection Clostridium difficile (C. difficile or C. diff) infection is a condition that occurs when an overgrowth of C. diff bacteria causes irritation and swelling (inflammation) of the colon (colitis). This infection can be passed from person to person (can be contagious). You may also be exposed to the bacteria from the environment, such as from food or water or from touching surfaces that have the bacteria on them. What are the causes? Certain bacteria normally live in the colon and help to digest food. This infection develops when the balance of bacteria in the colon is changed and C. diff grows out of control. This is often caused by taking antibiotics. What increases the risk? This condition is more likely to develop in people who:  Need to take antibiotics for a long period of time.  Take certain antibiotics that kill a wide range of bacteria.  Are in the hospital.  Are older.  Live in a place where there is a lot of contact with others, such as a nursing home.  Have had a C. diff infection before.  Have a weak defense (immune) system.  Take a medicine called proton pump inhibitors over a long period of time (chronic use).  Have serious underlying conditions, such as colon cancer.  Have had gastrointestinal (GI) tract procedure or surgery. What are the signs or symptoms? Symptoms of this condition include:  Diarrhea. This may be bloody, watery, or yellow or green in color.  Fever.  Fatigue.  Loss of appetite.  Nausea.  Swelling, pain, or tenderness in the abdomen. How is this diagnosed? This condition is diagnosed with medical history and physical exam. You may also have other tests, including:  A test that checks for C. diff in your stool.  Blood tests.  Imaging tests, such as a CT scan of your abdomen. In rare cases, a sigmoidoscopy or colonoscopy may be used to look  directly into your colon. These procedures involve passing an instrument through your rectum to look at the inside of your colon. How is this treated? Treatment for this condition includes:  Stopping the antibiotics that you were on when the C. diff infection began. Do this only as told by your health care provider.  Taking certain antibiotics to stop C. diff from growing.  Taking donor stool from a healthy person and placing it into the colon (fecal transplant) through a colonoscope or nasogastric tube. This may be done if you have a recurrent infection.  Having surgery to remove the infected part of the colon (rare). Follow these instructions at home: Eating and drinking   Eat bland, easy-to-digest foods in small amounts as you are able. These foods include bananas, applesauce, rice, lean meats, toast, and crackers.  Follow specific instructions on how to get enough water into your body (rehydrate). This may include: ? Drinking clear fluids, such as water, ice chips, diluted fruit juice, and low-calorie sports drinks. ? Taking an oral rehydration solution (ORS). This is a drink that is sold at pharmacies and retail stores.  Avoid milk, caffeine, and alcohol.  Drink enough fluid to keep your urine pale yellow. General instructions  Take over-the-counter and prescription medicines only as told by your health care provider.  Take your antibiotic medicine as told by your health care provider. Do not stop taking the antibiotic, even if you start to feel better. You may stop taking it only if your health  care provider tells you to stop.  Wash your hands with soap and water. If soap and water are not available, use hand sanitizer.  Do not use medicines to help with diarrhea.  Keep all follow-up visits as told by your health care provider. This is important. How is this prevented? Hand hygiene   Use soap and water to wash your hands thoroughly before you prepare food and after you  use the bathroom. Make sure that people who live with you also wash their hands often.  If you are being treated at a hospital or clinic, make sure that all health care providers wash their hands with soap and water before touching you.  If you are in the hospital, make sure that all visitors wash their hands with soap and water before touching you. Contact precautions  If you develop diarrhea while in the hospital or a long-term care facility, let your health care team know right away.  When visiting someone in the hospital or a long-term care facility, follow guidelines for wearing a gown, gloves, or other protective equipment.  If possible, avoid contact with people who have diarrhea. Clean environment  Clean surfaces with a product that contains chlorine bleach.  If you are in the hospital, make sure that staff members clean the surfaces in your room daily. Let a staff person know right away if body fluids have splashed or spilled in your room. Contact a health care provider if:  Your symptoms do not get better with treatment.  Your symptoms get worse, even with treatment.  Your symptoms go away and then come back.  You have a fever.  You have new symptoms. Get help right away if:  You have more pain or tenderness in your abdomen.  You have stool that is mostly bloody, or your stool looks dark black and tarry.  You cannot eat or drink without vomiting.  You have signs or symptoms of dehydration, such as: ? Dark urine, very little urine, or no urine. ? Cracked lips. ? Not making tears when you cry. ? Dry mouth. ? Sunken eyes. ? Sleepiness. ? Weakness. ? Dizziness. Summary  Clostridium difficile (C. difficile or C. diff) infection is a condition that occurs when an overgrowth of C. diff bacteria causes irritation and swelling (inflammation) of the colon.  Symptoms of C. diff infection include diarrhea, fever, fatigue, loss of appetite, nausea, and swelling, pain, or  tenderness in the abdomen.  C. diff infection is treated by stopping the antibiotics you were on when the C. diff infection began, and then taking certain antibiotics to keep C. diff from growing. In some cases, fecal transplant or surgery is performed.  Hand washing with soap and water, contact precautions, and a clean environment can help prevent or limit the spread of C. diff infection. This information is not intended to replace advice given to you by your health care provider. Make sure you discuss any questions you have with your health care provider. Document Released: 01/22/2005 Document Revised: 01/21/2017 Document Reviewed: 01/21/2017 Elsevier Interactive Patient Education  2019 [employer].

## 2018-08-04 ENCOUNTER — Other Ambulatory Visit: Payer: Self-pay

## 2018-08-04 MED ORDER — DULOXETINE HCL 60 MG PO CPEP: 60.0000 mg | ORAL_CAPSULE | Freq: Every day | ORAL | 1 refills | Status: DC

## 2018-08-04 MED ORDER — LAMOTRIGINE 150 MG PO TABS: 150.0000 mg | ORAL_TABLET | Freq: Every evening | ORAL | 1 refills | Status: DC

## 2018-08-04 MED ORDER — BUPROPION HCL ER (XL) 150 MG PO TB24: 150.0000 mg | ORAL_TABLET | Freq: Every day | ORAL | 1 refills | Status: DC

## 2018-08-04 MED ORDER — DULOXETINE HCL 30 MG PO CPEP: 30.0000 mg | ORAL_CAPSULE | Freq: Every morning | ORAL | 1 refills | Status: DC

## 2018-08-04 NOTE — Telephone Encounter (Signed)
rx's are to be sent to [facility] instead of [facility] for 90 day supply. Will resubmit all 4 from 07/12/2018 visit

## 2018-08-09 DIAGNOSIS — A0472 Enterocolitis due to Clostridium difficile, not specified as recurrent: Secondary | ICD-10-CM | POA: Diagnosis not present

## 2018-08-09 NOTE — Addendum Note (Signed)
Addended by: Ralph Dowdy on: 08/09/2018 01:16 PM   Modules accepted: Orders

## 2018-08-10 ENCOUNTER — Telehealth: Payer: Self-pay

## 2018-08-10 LAB — CLOSTRIDIUM DIFFICILE TOXIN B, QUALITATIVE, REAL-TIME PCR: Toxigenic C. Difficile by PCR: DETECTED — AB

## 2018-08-10 NOTE — Telephone Encounter (Signed)
FYI:  Raquel from [facility] called with priority lab results for patient's stool test done on 08/09/18. C Diff was detected, results are also in pt's chart.

## 2018-08-11 ENCOUNTER — Telehealth: Payer: Self-pay | Admitting: Family Medicine

## 2018-08-11 MED ORDER — VANCOMYCIN HCL 125 MG PO CAPS: ORAL_CAPSULE | ORAL | 0 refills | Status: DC

## 2018-08-11 NOTE — Telephone Encounter (Signed)
Please inform patient the following information: Attempted to call to discuss her results and was unable to get a hold of her.  -Her stool sample was still positive for C. Difficile.  It has been greater than 7 weeks since her treatment was completed this really should still not contain any C. Difficile. -Even though she is asymptomatic at this time, I have concerns that she will slowly progress to symptoms again like she did the last time in just a few weeks to months. -  I would suggest an elongated treatment with the vancomycin tabs she had prior.  Treatment plan would be 1 tab 4 times a day for 10 days, then 1 tab twice daily for 7 days, then 1 tab daily for 7 days, and then 1 tab every other day for 2 weeks.  This treatment should completely eradicate the C. difficile why the counts are low enough not to cause her symptoms.  If she completes the above treatment, no repeat testing would be needed as long as she stays asymptomatic. I have called this into her local pharmacy.

## 2018-08-11 NOTE — Telephone Encounter (Signed)
Patient called back to speak with the doctor regarding the message that was left for her.   Patient has some questions that the doctor or nurse can answer.  CB# [phone]

## 2018-08-12 NOTE — Telephone Encounter (Signed)
Pt was called and given results and directions for medication. She verbalized understanding and agreed to plan of care

## 2018-08-12 NOTE — Telephone Encounter (Signed)
Called pt back and had to LM to return call

## 2018-10-25 ENCOUNTER — Other Ambulatory Visit: Payer: Self-pay

## 2018-10-26 ENCOUNTER — Ambulatory Visit (INDEPENDENT_AMBULATORY_CARE_PROVIDER_SITE_OTHER): Payer: 59 | Admitting: Obstetrics & Gynecology

## 2018-10-26 ENCOUNTER — Encounter: Payer: Self-pay | Admitting: Obstetrics & Gynecology

## 2018-10-26 VITALS — BP 138/90 | Ht 68.5 in | Wt 199.0 lb

## 2018-10-26 DIAGNOSIS — R1032 Left lower quadrant pain: Secondary | ICD-10-CM | POA: Diagnosis not present

## 2018-10-26 DIAGNOSIS — Z789 Other specified health status: Secondary | ICD-10-CM | POA: Diagnosis not present

## 2018-10-26 DIAGNOSIS — R87810 Cervical high risk human papillomavirus (HPV) DNA test positive: Secondary | ICD-10-CM

## 2018-10-26 DIAGNOSIS — Z1151 Encounter for screening for human papillomavirus (HPV): Secondary | ICD-10-CM

## 2018-10-26 DIAGNOSIS — Z01419 Encounter for gynecological examination (general) (routine) without abnormal findings: Secondary | ICD-10-CM | POA: Diagnosis not present

## 2018-10-26 NOTE — Addendum Note (Signed)
Addended by: Thurnell Garbe A on: 10/26/2018 02:43 PM   Modules accepted: Orders

## 2018-10-26 NOTE — Progress Notes (Signed)
Dawn Scott [birth year] [id number]   History:    48 y.o. G2P1A1L1 Widowed.  Daughter is 69 yo.  RP:  Established patient presenting for annual gyn exam   HPI: Very light regular menses post endometrial ablation.  No breakthrough bleeding.  Complains of left pelvic pain for 5 days, which was worse at the beginning and is now almost gone.  No abnormal vaginal discharge.  Abstinent.  Urine and bowel movements normal.  Breasts normal.  Body mass index 29.82.  Walking the dog.  Health labs with family physician.  Past medical history,surgical history, family history and social history were all reviewed and documented in the EPIC chart.  Gynecologic History Patient's last menstrual period was 10/18/2018. Contraception: abstinence and condoms as needed Last Pap: 09/2017. Results were: LGSIL/HPV HR positive.  Colpo 10/2017 Atypia, no dysplasia.  HPV 16-18-45 negative. Last mammogram: 04/2018. Results were: Negative Bone Density: Never Colonoscopy: Never  Obstetric History OB History  Gravida Para Term Preterm AB Living  2 1     1 1   SAB TAB Ectopic Multiple Live Births      1        # Outcome Date GA Lbr Len/2nd Weight Sex Delivery Anes PTL Lv  2 Ectopic           1 Para              ROS: A ROS was performed and pertinent positives and negatives are included in the history.  GENERAL: No fevers or chills. HEENT: No change in vision, no earache, sore throat or sinus congestion. NECK: No pain or stiffness. CARDIOVASCULAR: No chest pain or pressure. No palpitations. PULMONARY: No shortness of breath, cough or wheeze. GASTROINTESTINAL: No abdominal pain, nausea, vomiting or diarrhea, melena or bright red blood per rectum. GENITOURINARY: No urinary frequency, urgency, hesitancy or dysuria. MUSCULOSKELETAL: No joint or muscle pain, no back pain, no recent trauma. DERMATOLOGIC: No rash, no itching, no lesions. ENDOCRINE: No polyuria, polydipsia, no heat or cold intolerance. No recent change in  weight. HEMATOLOGICAL: No anemia or easy bruising or bleeding. NEUROLOGIC: No headache, seizures, numbness, tingling or weakness. PSYCHIATRIC: No depression, no loss of interest in normal activity or change in sleep pattern.     Exam:   BP 138/90   Ht 5' 8.5" (1.74 m)   Wt 199 lb (90.3 kg)   LMP 10/18/2018   BMI 29.82 kg/m   Body mass index is 29.82 kg/m.  General appearance : Well developed well nourished female. No acute distress HEENT: Eyes: no retinal hemorrhage or exudates,  Neck supple, trachea midline, no carotid bruits, no thyroidmegaly Lungs: Clear to auscultation, no rhonchi or wheezes, or rib retractions  Heart: Regular rate and rhythm, no murmurs or gallops Breast:Examined in sitting and supine position were symmetrical in appearance, no palpable masses or tenderness,  no skin retraction, no nipple inversion, no nipple discharge, no skin discoloration, no axillary or supraclavicular lymphadenopathy Abdomen: no palpable masses or tenderness, no rebound or guarding Extremities: no edema or skin discoloration or tenderness  Pelvic: Vulva: Normal             Vagina: No gross lesions or discharge  Cervix: No gross lesions or discharge.  Pap/HPV HR done.  Uterus  AV, normal size, shape and consistency, non-tender and mobile  Adnexa  Without masses or tenderness  Anus: Normal   Assessment/Plan:  48 y.o. female for annual exam   1. Encounter for routine gynecological examination with Papanicolaou smear  of cervix Normal gynecologic exam.  Pap test with high-risk HPV done today.  Breast exam normal.  Last screening mammogram January 2020 was negative.  Health labs with family physician.  Body mass index 29.82.  Recommend a mildly lower calorie/carb diet such as [employer].  Encourage to continue walking the dog.  Increase physical activities with aerobic activities 5 times a week and weightlifting every 2 days.  2. Left lower quadrant abdominal pain Left pelvic pain for  5 days, currently improving.  We will follow-up with a pelvic ultrasound to rule out a left ovarian cyst. - US Transvaginal Non-OB; Future  3. Use of condoms for contraception Currently abstinent, will use condoms as needed.  4. Cervical high risk HPV (human papillomavirus) test positive Pap test June 2019 showed low-grade SIL with HPV high-risk positive.  Colposcopy in July 2019 showed atypia, not reaching dysplasia.  HPV 16-18-45 negative.  Pap test with high-risk HPV done today.  Counseling on above issues and coordination of care more than 50% for 10 minutes.  Princess Bruins MD, 11:23 AM 10/26/2018

## 2018-10-26 NOTE — Patient Instructions (Signed)
1. Encounter for routine gynecological examination with Papanicolaou smear of cervix Normal gynecologic exam.  Pap test with high-risk HPV done today.  Breast exam normal.  Last screening mammogram January 2020 was negative.  Health labs with family physician.  Body mass index 29.82.  Recommend a mildly lower calorie/carb diet such as [employer].  Encourage to continue walking the dog.  Increase physical activities with aerobic activities 5 times a week and weightlifting every 2 days.  2. Left lower quadrant abdominal pain Left pelvic pain for 5 days, currently improving.  We will follow-up with a pelvic ultrasound to rule out a left ovarian cyst. - US Transvaginal Non-OB; Future  3. Use of condoms for contraception Currently abstinent, will use condoms as needed.  4. Cervical high risk HPV (human papillomavirus) test positive Pap test June 2019 showed low-grade SIL with HPV high-risk positive.  Colposcopy in July 2019 showed atypia, not reaching dysplasia.  HPV 16-18-45 negative.  Pap test with high-risk HPV done today.  [place], it was a pleasure seeing you today!  I will inform you of your results as soon as they are available.

## 2018-10-29 LAB — PAP, TP IMAGING W/ HPV RNA, RFLX HPV TYPE 16,18/45: HPV DNA High Risk: DETECTED — AB

## 2018-10-29 LAB — HPV TYPE 16 AND 18/45 RNA
HPV Type 16 RNA: NOT DETECTED
HPV Type 18/45 RNA: NOT DETECTED

## 2018-11-30 ENCOUNTER — Other Ambulatory Visit: Payer: Self-pay

## 2018-12-01 ENCOUNTER — Ambulatory Visit: Payer: 59 | Admitting: Obstetrics & Gynecology

## 2018-12-01 ENCOUNTER — Other Ambulatory Visit: Payer: 59

## 2018-12-05 ENCOUNTER — Other Ambulatory Visit: Payer: Self-pay | Admitting: Physician Assistant

## 2018-12-15 ENCOUNTER — Other Ambulatory Visit: Payer: Self-pay | Admitting: Physician Assistant

## 2018-12-22 ENCOUNTER — Other Ambulatory Visit: Payer: Self-pay | Admitting: Physician Assistant

## 2019-01-10 ENCOUNTER — Encounter: Payer: Self-pay | Admitting: Physician Assistant

## 2019-01-10 ENCOUNTER — Ambulatory Visit (INDEPENDENT_AMBULATORY_CARE_PROVIDER_SITE_OTHER): Payer: 59 | Admitting: Physician Assistant

## 2019-01-10 ENCOUNTER — Other Ambulatory Visit: Payer: Self-pay

## 2019-01-10 DIAGNOSIS — F411 Generalized anxiety disorder: Secondary | ICD-10-CM

## 2019-01-10 DIAGNOSIS — F331 Major depressive disorder, recurrent, moderate: Secondary | ICD-10-CM | POA: Diagnosis not present

## 2019-01-10 DIAGNOSIS — G4700 Insomnia, unspecified: Secondary | ICD-10-CM | POA: Diagnosis not present

## 2019-01-10 MED ORDER — BUPROPION HCL ER (XL) 150 MG PO TB24: 300.0000 mg | ORAL_TABLET | Freq: Every day | ORAL | 3 refills | Status: DC

## 2019-01-10 NOTE — Progress Notes (Signed)
[facility] Check  Patient ID: Dawn Scott,  MRN: [id number]  PCP: Ma Hillock, DO  Date of Evaluation: 01/10/2019 Time spent:15 minutes  Chief Complaint:  Chief Complaint    Follow-up     Virtual Visit via Telephone Note  I connected with patient by a video enabled telemedicine application or telephone, with their informed consent, and verified patient privacy and that I am speaking with the correct person using two identifiers.  I am private, in my office and the patient is home.   I discussed the limitations, risks, security and privacy concerns of performing an evaluation and management service by telephone and the availability of in person appointments. I also discussed with the patient that there may be a patient responsible charge related to this service. The patient expressed understanding and agreed to proceed.   I discussed the assessment and treatment plan with the patient. The patient was provided an opportunity to ask questions and all were answered. The patient agreed with the plan and demonstrated an understanding of the instructions.   The patient was advised to call back or seek an in-person evaluation if the symptoms worsen or if the condition fails to improve as anticipated.  I provided 15 minutes of non-face-to-face time during this encounter.  HISTORY/CURRENT STATUS: HPI For 6 month med check.  Patient denies loss of interest in usual activities and is able to enjoy things.  Does have decreased energy and motivation. Feels like the Wellbutrin is not working as well as it was. "I don't think it's COVID.  This has been going on for about 7 or 8 months.  I just did not mention it in the last time."  Appetite has not changed.  No extreme sadness, tearfulness, or feelings of hopelessness.  Denies any changes in concentration, making decisions or remembering things.  Denies suicidal or homicidal thoughts.  She sleeps well most of the time.  Anxiety is not a  problem right now.  Work is going well.  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  Denies dizziness, syncope, seizures, numbness, tingling, tremor, tics, unsteady gait, slurred speech, confusion. Denies muscle or joint pain, stiffness, or dystonia.  Individual Medical History/ Review of Systems: Changes? :No    Past medications for mental health diagnoses include: Klonopin, Prozac, Prosom, Ambien, Cymbalta, Wellbutrin, Lamictal   Allergies: Augmentin [amoxicillin-pot clavulanate]  Current Medications:  Current Outpatient Medications:  .  Biotin 1 MG CAPS, Take by mouth., Disp: , Rfl:  .  buPROPion (WELLBUTRIN XL) 150 MG 24 hr tablet, Take 2 tablets (300 mg total) by mouth daily., Disp: 90 tablet, Rfl: 3 .  Cholecalciferol (VITAMIN D3) 2000 units TABS, Take 1 tablet by mouth every morning., Disp: , Rfl:  .  Cyanocobalamin (B-12 PO), Take 1 tablet by mouth every morning. , Disp: , Rfl:  .  DULoxetine (CYMBALTA) 30 MG capsule, TAKE 1 CAPSULE BY MOUTH  EVERY MORNING WITH 60MG   CAPSULE FOR TOTAL 90 MG, Disp: 90 capsule, Rfl: 3 .  DULoxetine (CYMBALTA) 60 MG capsule, TAKE 1 CAPSULE BY MOUTH  DAILY TOTAL DAILY DOSE 90MG , Disp: 90 capsule, Rfl: 3 .  folic acid (FOLVITE) 1 MG tablet, Take 1 mg by mouth daily., Disp: , Rfl:  .  lamoTRIgine (LAMICTAL) 150 MG tablet, TAKE 1 TABLET BY MOUTH IN  THE EVENING, Disp: 90 tablet, Rfl: 3 .  loratadine (CLARITIN) 10 MG tablet, Take 10  mg by mouth every evening. , Disp: , Rfl:  .  Multiple Vitamins-Minerals (MULTIVITAMIN WITH MINERALS) tablet, Take 1 tablet by mouth every evening. , Disp: , Rfl:  .  Probiotic Product (DIGESTIVE ADVANTAGE) CAPS, Take 1 capsule by mouth daily., Disp: , Rfl:  Medication Side Effects: none  Family Medical/ Social History: Changes? No  MENTAL HEALTH EXAM:  There were no vitals taken for this visit.There is no height or weight on file to calculate BMI.  General  Appearance: unable to assess  Eye Contact:  unable to assess  Speech:  Clear and Coherent  Volume:  Normal  Mood:  Euthymic  Affect:  unable to assess  Thought Process:  Goal Directed  Orientation:  Full (Time, Place, and Person)  Thought Content: Logical   Suicidal Thoughts:  No  Homicidal Thoughts:  No  Memory:  WNL  Judgement:  Good  Insight:  Good  Psychomotor Activity:  Unable to assess  Concentration:  Concentration: Good  Recall:  Good  Fund of Knowledge: Good  Language: Good  Assets:  Desire for Improvement  ADL's:  Intact  Cognition: WNL  Prognosis:  Good    DIAGNOSES:    ICD-10-CM   1. Major depressive disorder, recurrent episode, moderate (HCC)  F33.1   2. Generalized anxiety disorder  F41.1   3. Insomnia, unspecified type  G47.00     Receiving Psychotherapy: No    RECOMMENDATIONS:  Increase Wellbutrin XL  150 mg, to 2 p.o. daily.  If that works well and she needs a refill before next visit, will send in 300 mg.  Other options would be to increase the Cymbalta or add Abilify, in that order.  She prefers what were doing by increasing the Wellbutrin first. Continue Cymbalta 30 mg +60 mg= 90 mg daily. Continue Lamictal 150 mg 1 p.o. nightly. Continue supplements as per med list. Return in 4 to 6 weeks.  Donnal Moat, PA-C   This record has been created using [employer].  Chart creation errors have been sought, but may not always have been located and corrected. Such creation errors do not reflect on the standard of medical care.

## 2019-01-12 ENCOUNTER — Other Ambulatory Visit: Payer: Self-pay

## 2019-01-13 ENCOUNTER — Ambulatory Visit (INDEPENDENT_AMBULATORY_CARE_PROVIDER_SITE_OTHER): Payer: 59

## 2019-01-13 ENCOUNTER — Encounter: Payer: Self-pay | Admitting: Obstetrics & Gynecology

## 2019-01-13 ENCOUNTER — Ambulatory Visit (INDEPENDENT_AMBULATORY_CARE_PROVIDER_SITE_OTHER): Payer: 59 | Admitting: Obstetrics & Gynecology

## 2019-01-13 DIAGNOSIS — N83202 Unspecified ovarian cyst, left side: Secondary | ICD-10-CM

## 2019-01-13 DIAGNOSIS — R1032 Left lower quadrant pain: Secondary | ICD-10-CM

## 2019-01-13 DIAGNOSIS — D259 Leiomyoma of uterus, unspecified: Secondary | ICD-10-CM

## 2019-01-13 NOTE — Progress Notes (Signed)
    TIMARI UNDERBERG 12-Sep-1970 [id number]        48 y.o.  G2P0011 Widowed.  Daughter 55 yo.  RP: Left intermittent pelvic pain for pelvic US  HPI: Left mild intermittent pelvic pain.  Very light menses every 1 to 3 months, S/P Endometrial ablation.  Using condoms as needed.   OB History  Gravida Para Term Preterm AB Living  2 1     1 1   SAB TAB Ectopic Multiple Live Births      1        # Outcome Date GA Lbr Len/2nd Weight Sex Delivery Anes PTL Lv  2 Ectopic           1 Para             Past medical history,surgical history, problem list, medications, allergies, family history and social history were all reviewed and documented in the EPIC chart.   Directed ROS with pertinent positives and negatives documented in the history of present illness/assessment and plan.  Exam:  There were no vitals filed for this visit. General appearance:  Normal  Pelvic US today: T/V images.  Anteverted uterus enlarged with several intramural fibroids.  The uterus measures 9.49 x 7.43 x 6.95 cm.  6 fibroids are measured, the largest of which measures 2.17 x 2.15 cm.  Endometrial lining thin, irregular post endometrial ablation, measured at 3.87 mm.  Right ovary mobile with normal follicular pattern and perfusion.  Left ovary with a 3.4 x 2.3 cm cyst with debris's.  Functional cyst versus endometrioma.  Left ovary mobile close to the left uterine sidewall, mildly tender.  Doppler shows perfusion to the left ovarian tissue.  No free fluid in the posterior cul-de-sac.   Assessment/Plan:  48 y.o. G2P0011   1. Left lower quadrant abdominal pain Mild intermittent left pelvic pain.  Controlled with ibuprofen as needed.  Pelvic ultrasound findings reviewed with patient.  Left small ovarian cyst measured at 3.4 cm more likely to be a functional cyst.  The possibility of a cyst of endometriosis discussed.  Patient prefers to observe rather than start on a progestin only birth control pill or a treatment for  possible endometriosis.  Will call back if worsening pelvic pains.  Counseling on above issues and coordination of care more than 50% for 15 minutes.  Princess Bruins MD, 11:44 AM 01/13/2019

## 2019-01-13 NOTE — Patient Instructions (Signed)
1. Left lower quadrant abdominal pain Mild intermittent left pelvic pain.  Controlled with ibuprofen as needed.  Pelvic ultrasound findings reviewed with patient.  Left small ovarian cyst measured at 3.4 cm more likely to be a functional cyst.  The possibility of a cyst of endometriosis discussed.  Patient prefers to observe rather than start on a progestin only birth control pill or a treatment for possible endometriosis.  Will call back if worsening pelvic pains.  Dawn Scott, it was a pleasure seeing you today!

## 2019-02-09 ENCOUNTER — Telehealth: Payer: Self-pay | Admitting: Physician Assistant

## 2019-02-09 NOTE — Telephone Encounter (Signed)
Pt wants to update that she is doing well on Wellbutrin XL taking two per day.  We had to cancel 10/19 with TH. R/S with Gina Mozingo. Will need to call in the increase in Wellbutrin 300mg .

## 2019-02-10 NOTE — Telephone Encounter (Signed)
May not be necessary since she will see Barnett Applebaum next week. She is good until then.

## 2019-02-10 NOTE — Telephone Encounter (Signed)
Reviewed

## 2019-02-10 NOTE — Telephone Encounter (Signed)
Dawn Scott can you see what pharmacy she uses and then I'll send in Rx.  Thanks.

## 2019-02-14 ENCOUNTER — Encounter: Payer: Self-pay | Admitting: Adult Health

## 2019-02-14 ENCOUNTER — Other Ambulatory Visit: Payer: Self-pay

## 2019-02-14 ENCOUNTER — Ambulatory Visit: Payer: 59 | Admitting: Physician Assistant

## 2019-02-14 ENCOUNTER — Ambulatory Visit (INDEPENDENT_AMBULATORY_CARE_PROVIDER_SITE_OTHER): Payer: 59 | Admitting: Adult Health

## 2019-02-14 DIAGNOSIS — F411 Generalized anxiety disorder: Secondary | ICD-10-CM

## 2019-02-14 DIAGNOSIS — F331 Major depressive disorder, recurrent, moderate: Secondary | ICD-10-CM | POA: Diagnosis not present

## 2019-02-14 DIAGNOSIS — G4700 Insomnia, unspecified: Secondary | ICD-10-CM

## 2019-02-14 MED ORDER — DULOXETINE HCL 30 MG PO CPEP: 30.0000 mg | ORAL_CAPSULE | Freq: Every day | ORAL | 3 refills | Status: DC

## 2019-02-14 MED ORDER — BUPROPION HCL ER (XL) 300 MG PO TB24: 300.0000 mg | ORAL_TABLET | Freq: Every day | ORAL | 3 refills | Status: DC

## 2019-02-14 MED ORDER — DULOXETINE HCL 60 MG PO CPEP: 60.0000 mg | ORAL_CAPSULE | Freq: Every day | ORAL | 3 refills | Status: DC

## 2019-02-14 MED ORDER — LAMOTRIGINE 150 MG PO TABS: 150.0000 mg | ORAL_TABLET | Freq: Every evening | ORAL | 3 refills | Status: DC

## 2019-02-14 NOTE — Progress Notes (Signed)
Dawn Scott [id number] 1970/07/25 48 y.o.  Subjective:   Patient ID:  Dawn Scott is a 48 y.o. (DOB 1970-12-14) female.  Chief Complaint: No chief complaint on file.   HPI Dawn Scott presents to the office today for follow-up of anxiety, depression, and insomnia.  Describes mood today as "ok". Pleasant. Mood symptoms - denies depression, anxiety, and irritability. Stating "I'm doing much better". Increase in Wellbutrin has been more helpful. Typically has SAD. Has bought a "light" to use this winter. Stable interest and motivation. Taking medications as prescribed.  Energy levels stable. Active, does not have a regular exercise routine. Works full-time  Enjoys some usual interests and activities. Lives with 6 year old daughter. In-laws local. Mother 2 hours away.  Appetite adequate. Weight stable - 210. Sleeps well most nights. Averages 7 to 8 hours. Focus and concentration stable. Completing tasks. Managing aspects of household. 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  Denies SI or HI. Denies AH or VH.  Past medications for mental health diagnoses include: Klonopin, Prozac, Prosom, Ambien, Cymbalta, Wellbutrin, Lamictal   Allergies: Augmentin [amoxicillin-pot clavulanate]  Review of Systems:  Review of Systems  Musculoskeletal: Negative for gait problem.  Neurological: Negative for tremors.  Psychiatric/Behavioral:       Please refer to HPI    Medications: I have reviewed the patient's current medications.  Current Outpatient Medications  Medication Sig Dispense Refill  . Biotin 1 MG CAPS Take by mouth.    Marland Kitchen buPROPion (WELLBUTRIN XL) 300 MG 24 hr tablet Take 1 tablet (300 mg total) by mouth daily. 90 tablet 3  . Cholecalciferol (VITAMIN D3) 2000 units TABS Take 1 tablet by mouth every morning.    . Cyanocobalamin (B-12 PO) Take 1 tablet by mouth every morning.     . DULoxetine (CYMBALTA) 30 MG capsule Take 1 capsule (30 mg total) by mouth daily. 90 capsule  3  . DULoxetine (CYMBALTA) 60 MG capsule Take 1 capsule (60 mg total) by mouth daily. 90 capsule 3  . folic acid (FOLVITE) 1 MG tablet Take 1 mg by mouth daily.    Marland Kitchen lamoTRIgine (LAMICTAL) 150 MG tablet Take 1 tablet (150 mg total) by mouth every evening. 90 tablet 3  . loratadine (CLARITIN) 10 MG tablet Take 10 mg by mouth every evening.     . Multiple Vitamins-Minerals (MULTIVITAMIN WITH MINERALS) tablet Take 1 tablet by mouth every evening.     . Probiotic Product (DIGESTIVE ADVANTAGE) CAPS Take 1 capsule by mouth daily.     No current facility-administered medications for this visit.     Medication Side Effects: None  Allergies:  Allergies  Allergen Reactions  . Augmentin [Amoxicillin-Pot Clavulanate] Other (See Comments)    Diarrhea C.diff positive.    Past Medical History:  Diagnosis Date  . Anxiety   . Clostridioides difficile infection   . Depression   . History of ectopic pregnancy    04/ 2002  S/P  LEFT SALPINGECTOMY  . History of endometriosis   . History of ovarian cyst   . Left ovarian cyst   . Menorrhagia with regular cycle   . PONV (postoperative nausea and vomiting)   . Uterine fibroid     Family History  Problem Relation Age of Onset  . Depression Mother   . Arthritis Mother   . Heart disease Mother   . Transient ischemic attack Mother   . Alcohol abuse Father   . Depression Father   . Diabetes Father   .  Alcohol abuse Maternal Grandfather   . Leukemia Maternal Grandfather   . Early death Maternal Grandfather        leukemia  . Breast cancer Maternal Aunt 07-Aug-2068  . Early death Maternal Aunt        cancer  . Depression Paternal Uncle   . Transient ischemic attack Maternal Grandmother   . Arthritis Paternal Grandmother   . Dementia Paternal Grandmother   . Alcohol abuse Paternal Grandfather   . Early death Paternal Grandfather     Social History   Socioeconomic History  . Marital status: Widowed    Spouse name: Not on file  . Number of  children: Not on file  . Years of education: Not on file  . Highest education level: Not on file  Occupational History  . Not on file  Social Needs  . Financial resource strain: Not on file  . Food insecurity    Worry: Not on file    Inability: Not on file  . Transportation needs    Medical: Not on file    Non-medical: Not on file  Tobacco Use  . Smoking status: Never Smoker  . Smokeless tobacco: Never Used  Substance and Sexual Activity  . Alcohol use: Yes    Comment: very Rare  . Drug use: No  . Sexual activity: Not Currently  Lifestyle  . Physical activity    Days per week: Not on file    Minutes per session: Not on file  . Stress: Not on file  Relationships  . Social Herbalist on phone: Not on file    Gets together: Not on file    Attends religious service: Not on file    Active member of club or organization: Not on file    Attends meetings of clubs or organizations: Not on file    Relationship status: Not on file  . Intimate partner violence    Fear of current or ex partner: Not on file    Emotionally abused: Not on file    Physically abused: Not on file    Forced sexual activity: Not on file  Other Topics Concern  . Not on file  Social History Narrative   Widower. Husband died on the job 08-Aug-2011.   Bachelor of fine arts, currently not working    One child, Designer, jewellery.   No tobacco or drug use, rare use of alcohol.   Drinks caffeinated beverages, takes a daily vitamin   Wears her seatbelt, wears a bicycle, smoke detectors in the home   exercise at least 3 times a week.   Feels safe in her relationships.    Past Medical History, Surgical history, Social history, and Family history were reviewed and updated as appropriate.   Please see review of systems for further details on the patient's review from today.   Objective:   Physical Exam:  There were no vitals taken for this visit.  Physical Exam Constitutional:      General: She is not in acute  distress.    Appearance: She is well-developed.  Musculoskeletal:        General: No deformity.  Neurological:     Mental Status: She is alert and oriented to person, place, and time.     Coordination: Coordination normal.  Psychiatric:        Attention and Perception: Attention and perception normal. She does not perceive auditory or visual hallucinations.        Mood and Affect: Mood normal.  Mood is not anxious or depressed. Affect is not labile, blunt, angry or inappropriate.        Speech: Speech normal.        Behavior: Behavior normal.        Thought Content: Thought content normal. Thought content is not paranoid or delusional. Thought content does not include homicidal or suicidal ideation. Thought content does not include homicidal or suicidal plan.        Cognition and Memory: Cognition and memory normal.        Judgment: Judgment normal.     Comments: Insight intact     Lab Review:     Component Value Date/Time   NA 135 07/20/2016 1320   K 3.6 07/20/2016 1320   CL 103 07/20/2016 1320   CO2 24 07/20/2016 1320   GLUCOSE 116 (H) 07/20/2016 1320   BUN 13 07/20/2016 1320   CREATININE 0.74 07/20/2016 1320   CREATININE 0.86 07/07/2016 1114   CALCIUM 8.7 (L) 07/20/2016 1320   PROT 6.6 07/20/2016 1320   ALBUMIN 3.8 07/20/2016 1320   AST 21 07/20/2016 1320   ALT 9 (L) 07/20/2016 1320   ALKPHOS 46 07/20/2016 1320   BILITOT 0.6 07/20/2016 1320   GFRNONAA >60 07/20/2016 1320   GFRNONAA 82 07/07/2016 1114   GFRAA >60 07/20/2016 1320   GFRAA >89 07/07/2016 1114       Component Value Date/Time   WBC 5.1 10/24/2016 0822   RBC 4.63 10/24/2016 0822   HGB 13.5 10/24/2016 0822   HCT 39.7 10/24/2016 0822   PLT 301 10/24/2016 0822   MCV 85.7 10/24/2016 0822   MCH 29.2 10/24/2016 0822   MCHC 34.0 10/24/2016 0822   RDW 12.8 10/24/2016 0822   LYMPHSABS 1,500 07/07/2016 1114   MONOABS 300 07/07/2016 1114   EOSABS 100 07/07/2016 1114   BASOSABS 50 07/07/2016 1114    No  results found for: POCLITH, LITHIUM   No results found for: PHENYTOIN, PHENOBARB, VALPROATE, CBMZ   .res Assessment: Plan:    RECOMMENDATIONS:  Wellbutrin XL 300mg  daily  Continue Cymbalta 30 mg + 60 mg = 90 mg daily. Continue Lamictal 150 mg 1 p.o. nightly.  Continue supplements as per med list.  Return in 6 months  Diagnoses and all orders for this visit:  Major depressive disorder, recurrent episode, moderate (HCC)  Generalized anxiety disorder  Insomnia, unspecified type  Other orders -     buPROPion (WELLBUTRIN XL) 300 MG 24 hr tablet; Take 1 tablet (300 mg total) by mouth daily. -     DULoxetine (CYMBALTA) 30 MG capsule; Take 1 capsule (30 mg total) by mouth daily. -     DULoxetine (CYMBALTA) 60 MG capsule; Take 1 capsule (60 mg total) by mouth daily. -     lamoTRIgine (LAMICTAL) 150 MG tablet; Take 1 tablet (150 mg total) by mouth every evening.     Please see After Visit Summary for patient specific instructions.  Future Appointments  Date Time Provider [place]  03/09/2019  4:00 PM Addison Lank, PA-C [facility] None    No orders of the defined types were placed in this encounter.   -------------------------------

## 2019-02-23 ENCOUNTER — Telehealth: Payer: Self-pay

## 2019-02-23 ENCOUNTER — Ambulatory Visit (INDEPENDENT_AMBULATORY_CARE_PROVIDER_SITE_OTHER): Payer: 59 | Admitting: Family Medicine

## 2019-02-23 ENCOUNTER — Encounter: Payer: Self-pay | Admitting: Family Medicine

## 2019-02-23 ENCOUNTER — Other Ambulatory Visit: Payer: Self-pay

## 2019-02-23 VITALS — BP 132/94 | Temp 98.1°F | Resp 18 | Ht 70.0 in | Wt 203.5 lb

## 2019-02-23 DIAGNOSIS — E663 Overweight: Secondary | ICD-10-CM | POA: Insufficient documentation

## 2019-02-23 DIAGNOSIS — F411 Generalized anxiety disorder: Secondary | ICD-10-CM

## 2019-02-23 DIAGNOSIS — I10 Essential (primary) hypertension: Secondary | ICD-10-CM | POA: Insufficient documentation

## 2019-02-23 DIAGNOSIS — G4700 Insomnia, unspecified: Secondary | ICD-10-CM

## 2019-02-23 DIAGNOSIS — Z114 Encounter for screening for human immunodeficiency virus [HIV]: Secondary | ICD-10-CM | POA: Diagnosis not present

## 2019-02-23 DIAGNOSIS — F32A Depression, unspecified: Secondary | ICD-10-CM

## 2019-02-23 DIAGNOSIS — E559 Vitamin D deficiency, unspecified: Secondary | ICD-10-CM

## 2019-02-23 DIAGNOSIS — Z131 Encounter for screening for diabetes mellitus: Secondary | ICD-10-CM | POA: Diagnosis not present

## 2019-02-23 DIAGNOSIS — Z130 Encounter for screening for diseases of the blood and blood-forming organs and certain disorders involving the immune mechanism: Secondary | ICD-10-CM | POA: Diagnosis not present

## 2019-02-23 DIAGNOSIS — F329 Major depressive disorder, single episode, unspecified: Secondary | ICD-10-CM

## 2019-02-23 DIAGNOSIS — Z0001 Encounter for general adult medical examination with abnormal findings: Secondary | ICD-10-CM

## 2019-02-23 DIAGNOSIS — Z79899 Other long term (current) drug therapy: Secondary | ICD-10-CM

## 2019-02-23 LAB — CBC
HCT: 38.5 % (ref 36.0–46.0)
Hemoglobin: 13.2 g/dL (ref 12.0–15.0)
MCHC: 34.4 g/dL (ref 30.0–36.0)
MCV: 87.7 fl (ref 78.0–100.0)
Platelets: 338 10*3/uL (ref 150.0–400.0)
RBC: 4.38 Mil/uL (ref 3.87–5.11)
RDW: 13.3 % (ref 11.5–15.5)
WBC: 5.9 10*3/uL (ref 4.0–10.5)

## 2019-02-23 LAB — COMPREHENSIVE METABOLIC PANEL
ALT: 9 U/L (ref 0–35)
AST: 15 U/L (ref 0–37)
Albumin: 4.3 g/dL (ref 3.5–5.2)
Alkaline Phosphatase: 52 U/L (ref 39–117)
BUN: 9 mg/dL (ref 6–23)
CO2: 28 mEq/L (ref 19–32)
Calcium: 9.2 mg/dL (ref 8.4–10.5)
Chloride: 97 mEq/L (ref 96–112)
Creatinine, Ser: 0.71 mg/dL (ref 0.40–1.20)
GFR: 88.18 mL/min (ref >60.00)
Glucose, Bld: 68 mg/dL — ABNORMAL LOW (ref 70–99)
Potassium: 3.9 mEq/L (ref 3.5–5.1)
Sodium: 133 mEq/L — ABNORMAL LOW (ref 135–145)
Total Bilirubin: 0.3 mg/dL (ref 0.2–1.2)
Total Protein: 6.7 g/dL (ref 6.0–8.3)

## 2019-02-23 LAB — LIPID PANEL
Cholesterol: 222 mg/dL — ABNORMAL HIGH (ref 0–200)
HDL: 58.6 mg/dL (ref >39.00)
NonHDL: 163.05
Total CHOL/HDL Ratio: 4
Triglycerides: 293 mg/dL — ABNORMAL HIGH (ref 0.0–149.0)
VLDL: 58.6 mg/dL — ABNORMAL HIGH (ref 0.0–40.0)

## 2019-02-23 LAB — LDL CHOLESTEROL, DIRECT: Direct LDL: 127 mg/dL

## 2019-02-23 MED ORDER — HYDROCHLOROTHIAZIDE 12.5 MG PO TABS: 12.5000 mg | ORAL_TABLET | Freq: Every day | ORAL | 3 refills | Status: DC

## 2019-02-23 NOTE — Patient Instructions (Signed)
Health Maintenance, Female Adopting a healthy lifestyle and getting preventive care are important in promoting health and wellness. Ask your health care provider about:  The right schedule for you to have regular tests and exams.  Things you can do on your own to prevent diseases and keep yourself healthy. What should I know about diet, weight, and exercise? Eat a healthy diet   Eat a diet that includes plenty of vegetables, fruits, low-fat dairy products, and lean protein.  Do not eat a lot of foods that are high in solid fats, added sugars, or sodium. Maintain a healthy weight Body mass index (BMI) is used to identify weight problems. It estimates body fat based on height and weight. Your health care provider can help determine your BMI and help you achieve or maintain a healthy weight. Get regular exercise Get regular exercise. This is one of the most important things you can do for your health. Most adults should:  Exercise for at least 150 minutes each week. The exercise should increase your heart rate and make you sweat (moderate-intensity exercise).  Do strengthening exercises at least twice a week. This is in addition to the moderate-intensity exercise.  Spend less time sitting. Even light physical activity can be beneficial. Watch cholesterol and blood lipids Have your blood tested for lipids and cholesterol at 47 years of age, then have this test every 5 years. Have your cholesterol levels checked more often if:  Your lipid or cholesterol levels are high.  You are older than 47 years of age.  You are at high risk for heart disease. What should I know about cancer screening? Depending on your health history and family history, you may need to have cancer screening at various ages. This may include screening for:  Breast cancer.  Cervical cancer.  Colorectal cancer.  Skin cancer.  Lung cancer. What should I know about heart disease, diabetes, and high blood  pressure? Blood pressure and heart disease  High blood pressure causes heart disease and increases the risk of stroke. This is more likely to develop in people who have high blood pressure readings, are of African descent, or are overweight.  Have your blood pressure checked: ? Every 3-5 years if you are 18-39 years of age. ? Every year if you are 40 years old or older. Diabetes Have regular diabetes screenings. This checks your fasting blood sugar level. Have the screening done:  Once every three years after age 40 if you are at a normal weight and have a low risk for diabetes.  More often and at a younger age if you are overweight or have a high risk for diabetes. What should I know about preventing infection? Hepatitis B If you have a higher risk for hepatitis B, you should be screened for this virus. Talk with your health care provider to find out if you are at risk for hepatitis B infection. Hepatitis C Testing is recommended for:  Everyone born from 1945 through 1965.  Anyone with known risk factors for hepatitis C. Sexually transmitted infections (STIs)  Get screened for STIs, including gonorrhea and chlamydia, if: ? You are sexually active and are younger than 47 years of age. ? You are older than 47 years of age and your health care provider tells you that you are at risk for this type of infection. ? Your sexual activity has changed since you were last screened, and you are at increased risk for chlamydia or gonorrhea. Ask your health care provider if   you are at risk.  Ask your health care provider about whether you are at high risk for HIV. Your health care provider may recommend a prescription medicine to help prevent HIV infection. If you choose to take medicine to prevent HIV, you should first get tested for HIV. You should then be tested every 3 months for as long as you are taking the medicine. Pregnancy  If you are about to stop having your period (premenopausal) and  you may become pregnant, seek counseling before you get pregnant.  Take 400 to 800 micrograms (mcg) of folic acid every day if you become pregnant.  Ask for birth control (contraception) if you want to prevent pregnancy. Osteoporosis and menopause Osteoporosis is a disease in which the bones lose minerals and strength with aging. This can result in bone fractures. If you are 65 years old or older, or if you are at risk for osteoporosis and fractures, ask your health care provider if you should:  Be screened for bone loss.  Take a calcium or vitamin D supplement to lower your risk of fractures.  Be given hormone replacement therapy (HRT) to treat symptoms of menopause. Follow these instructions at home: Lifestyle  Do not use any products that contain nicotine or tobacco, such as cigarettes, e-cigarettes, and chewing tobacco. If you need help quitting, ask your health care provider.  Do not use street drugs.  Do not share needles.  Ask your health care provider for help if you need support or information about quitting drugs. Alcohol use  Do not drink alcohol if: ? Your health care provider tells you not to drink. ? You are pregnant, may be pregnant, or are planning to become pregnant.  If you drink alcohol: ? Limit how much you use to 0-1 drink a day. ? Limit intake if you are breastfeeding.  Be aware of how much alcohol is in your drink. In the [place] one drink equals one 12 oz bottle of beer (355 mL), one 5 oz glass of wine (148 mL), or one 1 oz glass of hard liquor (44 mL). General instructions  Schedule regular health, dental, and eye exams.  Stay current with your vaccines.  Tell your health care provider if: ? You often feel depressed. ? You have ever been abused or do not feel safe at home. Summary  Adopting a healthy lifestyle and getting preventive care are important in promoting health and wellness.  Follow your health care provider's instructions about healthy  diet, exercising, and getting tested or screened for diseases.  Follow your health care provider's instructions on monitoring your cholesterol and blood pressure. This information is not intended to replace advice given to you by your health care provider. Make sure you discuss any questions you have with your health care provider. Document Released: 10/28/2010 Document Revised: 04/07/2018 Document Reviewed: 04/07/2018 [employer] Patient Education  2020 [employer]  

## 2019-02-23 NOTE — Progress Notes (Signed)
Patient ID: Dawn Scott, female  DOB: February 06, 1971, 48 y.o.   MRN: [id number] Patient Care Team    Relationship Specialty Notifications Start End  Ma Hillock, DO PCP - General Family Medicine  06/28/15   Princess Bruins, MD Consulting Physician Obstetrics and Gynecology  06/28/15   Purnell Shoemaker., MD Attending Physician Psychiatry  06/28/15     Chief Complaint  Patient presents with  . Annual Exam    Not fasting. Pap smear 10/26/2018. Mammogram 04/2018. Pt has [facility] physician result form for work to be filled out     Subjective:  Dawn Scott is a 48 y.o.  Female  present for CPE. All past medical history, surgical history, allergies, family history, immunizations, medications and social history were updated in the electronic medical record today. All recent labs, ED visits and hospitalizations within the last year were reviewed.  Health maintenance:  Colonoscopy: no fhx routine screen at 51 Mammogram: completed:05/17/2018, birads 1. Managed by gyn. Cervical cancer screening: last pap: 10/26/2018, results: Dr. Dellis Filbert Immunizations: tdap UTD 2016, Influenza UTD 2020 (encouraged yearly) Infectious disease screening: HIV screen- pt would like to have screen today.  DEXA: routine screen Assistive device: none Oxygen [id number] Patient has a Dental home. Hospitalizations/ED visits: none  Depression screen [facility] 2/9 02/23/2019 06/28/2015  Decreased Interest 3 1  Down, Depressed, Hopeless 0 0  PHQ - 2 Score 3 1  Altered sleeping 0 -  Tired, decreased energy 0 -  Change in appetite 0 -  Feeling bad or failure about yourself  0 -  Trouble concentrating 0 -  Moving slowly or fidgety/restless 0 -  Suicidal thoughts 0 -  PHQ-9 Score 3 -  Difficult doing work/chores Not difficult at all -   GAD 7 : Generalized Anxiety Score 06/28/2015  Nervous, Anxious, on Edge 0  Control/stop worrying 0  Worry too much - different things 0  Trouble relaxing 0  Easily annoyed or irritable  0  Afraid - awful might happen 0    Immunization History  Administered Date(s) Administered  . Influenza,inj,Quad PF,6+ Mos 02/04/2013, 01/18/2014, 01/22/2015  . Tdap 11/24/2014     Past Medical History:  Diagnosis Date  . Anxiety   . Clostridioides difficile infection   . Depression   . History of ectopic pregnancy    04/ 2002  S/P  LEFT SALPINGECTOMY  . History of endometriosis   . History of ovarian cyst   . Left ovarian cyst   . Menorrhagia with regular cycle   . PONV (postoperative nausea and vomiting)   . Uterine fibroid    Allergies  Allergen Reactions  . Augmentin [Amoxicillin-Pot Clavulanate] Other (See Comments)    Diarrhea C.diff positive.   Past Surgical History:  Procedure Laterality Date  . DILATATION & CURETTAGE/HYSTEROSCOPY WITH MYOSURE N/A 10/30/2016   Procedure: , REMOVAL IUD, HYSTEROSCOPY, DILATION AND CURETTAGE;  Surgeon: Princess Bruins, MD;  Location: [facility];  Service: Gynecology;  Laterality: N/A;  . DILATION AND EVACUATION  03/10/2003   dr Dellis Filbert   retained placental tissue postpartum  . DX LAPAROSCOPY/ LYSIS ADHESIONAS/  FULGERATION ENDOMETRIOSIS/  LEFT SALPINGECTOMY WITH REMOVAL ECOTPIC  08/18/2000   dr Ronita Hipps  . HYSTEROSCOPY WITH NOVASURE N/A 10/30/2016   Procedure: HYSTEROSCOPY WITH NOVASURE;  Surgeon: Princess Bruins, MD;  Location: [facility];  Service: Gynecology;  Laterality: N/A;  . LAPAROSCOPIC APPENDECTOMY  11/30/2007  . SEPTOPLASTY  2011  . TONSILLECTOMY AND ADENOIDECTOMY  child  .  WISDOM TOOTH EXTRACTION  1996   Family History  Problem Relation Age of Onset  . Depression Mother   . Arthritis Mother   . Heart disease Mother   . Transient ischemic attack Mother   . Alcohol abuse Father   . Depression Father   . Diabetes Father   . Alcohol abuse Maternal Grandfather   . Leukemia Maternal Grandfather   . Early death Maternal Grandfather        leukemia  . Breast cancer Maternal Aunt 08-04-2068  .  Early death Maternal Aunt        cancer  . Depression Paternal Uncle   . Transient ischemic attack Maternal Grandmother   . Arthritis Paternal Grandmother   . Dementia Paternal Grandmother   . Alcohol abuse Paternal Grandfather   . Early death Paternal Grandfather    Social History   Social History Narrative   Widower. Husband died on the job 08/05/2011.   Bachelor of fine arts, currently not working    One child, Designer, jewellery.   No tobacco or drug use, rare use of alcohol.   Drinks caffeinated beverages, takes a daily vitamin   Wears her seatbelt, wears a bicycle, smoke detectors in the home   exercise at least 3 times a week.   Feels safe in her relationships.    Allergies as of 02/23/2019      Reactions   Augmentin [amoxicillin-pot Clavulanate] Other (See Comments)   Diarrhea C.diff positive.      Medication List       Accurate as of February 23, 2019  1:51 PM. If you have any questions, ask your nurse or doctor.        B-12 PO Take 1 tablet by mouth every morning.   Biotin 1 MG Caps Take by mouth.   buPROPion 300 MG 24 hr tablet Commonly known as: WELLBUTRIN XL Take 1 tablet (300 mg total) by mouth daily.   Digestive Advantage Caps Take 1 capsule by mouth daily.   DULoxetine 30 MG capsule Commonly known as: CYMBALTA Take 1 capsule (30 mg total) by mouth daily.   DULoxetine 60 MG capsule Commonly known as: CYMBALTA Take 1 capsule (60 mg total) by mouth daily.   folic acid 1 MG tablet Commonly known as: FOLVITE Take 1 mg by mouth daily.   hydrochlorothiazide 12.5 MG tablet Commonly known as: HYDRODIURIL Take 1 tablet (12.5 mg total) by mouth daily. Started by: Howard Pouch, DO   lamoTRIgine 150 MG tablet Commonly known as: LAMICTAL Take 1 tablet (150 mg total) by mouth every evening.   loratadine 10 MG tablet Commonly known as: CLARITIN Take 10 mg by mouth every evening.   multivitamin with minerals tablet Take 1 tablet by mouth every evening.    Vitamin D3 50 MCG (2000 UT) Tabs Take 1 tablet by mouth every morning.       All past medical history, surgical history, allergies, family history, immunizations andmedications were updated in the EMR today and reviewed under the history and medication portions of their EMR.     No results found for this or any previous visit (from the past 2158-08-05 hour(s)).  No results found.   ROS: 14 pt review of systems performed and negative (unless mentioned in an HPI)  Objective: BP (!) 132/94 (BP Location: Left Arm, Patient Position: Supine, Cuff Size: Normal)   Temp 98.1 F (36.7 C) (Temporal)   Resp 18   Ht 5\' 10"  (1.778 m)   Wt 203 lb 8 oz (  92.3 kg)   LMP 02/09/2019 (Exact Date)   SpO2 100%   BMI 29.20 kg/m  Gen: Afebrile. No acute distress. Nontoxic in appearance, well-developed, well-nourished,  Overweight caucasian female.  HENT: AT. [place]. Bilateral TM visualized and normal in appearance, normal external auditory canal. MMM, no oral lesions, adequate dentition. Bilateral nares within normal limits. Throat without erythema, ulcerations or exudates. no Cough on exam, no hoarseness on exam. Eyes:Pupils Equal Round Reactive to light, Extraocular movements intact,  Conjunctiva without redness, discharge or icterus. Neck/lymp/endocrine: Supple,no lymphadenopathy, no thyromegaly CV: RRR no murmur, no edema, +2/4 P posterior tibialis pulses. no carotid bruits. No JVD. Chest: CTAB, no wheeze, rhonchi or crackles. normal Respiratory effort. good Air movement. Abd: Soft. flat. NTND. BS present. no Masses palpated. No hepatosplenomegaly. No rebound tenderness or guarding. Skin: no rashes, purpura or petechiae. Warm and well-perfused. Skin intact. Neuro/Msk:  Normal gait. PERLA. EOMi. Alert. Oriented x3.  Cranial nerves II through XII intact. Muscle strength 5/5 upper/lower extremity. DTRs equal bilaterally. Psych: Normal affect, dress and demeanor. Normal speech. Normal thought content and judgment.    No exam data present  Assessment/plan: HALY BUNDA is a 48 y.o. female present for CPE  Overweight (BMI 25.0-29.9) - diet and exercise.  - Lipid panel  Vitamin D deficiency - Vitamin D (25 hydroxy) Encounter for screening for HIV - HIV antibody (with reflex) Hx of long-term treatment with high-risk medication - CBC - Comprehensive metabolic panel Diabetes mellitus screening - Hemoglobin A1c Screening for deficiency anemia - CBC  Depression, unspecified depression type/GAD (generalized anxiety disorder)/Insomnia, unspecified type Managed by psychiatry.  - TSH  Essential hypertension New problem. She has been borderline with her BP readings over the last 6 months. Discussed options with her today and she would like to start a low dose HCTZ.  - HCTZ 12.5 mg QD prescribed.  - will follow yearly as long as BP < 135/85. If BP increases or not controlled will need to see her back. She reports understanding.   Encounter for general adult medical examination with abnormal findings Patient was encouraged to exercise greater than 150 minutes a week. Patient was encouraged to choose a diet filled with fresh fruits and vegetables, and lean meats. AVS provided to patient today for education/recommendation on gender specific health and safety maintenance. Colonoscopy: no fhx routine screen at 51 Mammogram: completed:05/17/2018, birads 1. Managed by gyn. Cervical cancer screening: last pap: 10/26/2018, results: Dr. Dellis Filbert Immunizations: tdap UTD 2016, Influenza UTD 2020 (encouraged yearly) Infectious disease screening: HIV screen- pt would like to have screen today.  DEXA: routine screen   Return in about 1 year (around 02/23/2020) for CPE (30 min).  Electronically signed by: Howard Pouch, DO [place]

## 2019-02-23 NOTE — Telephone Encounter (Signed)
Physician results form from [facility] was brought by patient to be filled out during CPE. Placed on my desk until lab results are back. Patient would like to come and pick up form when complete. Pt advised we would call when ready

## 2019-02-24 LAB — HEMOGLOBIN A1C: Hgb A1c MFr Bld: 5 % (ref 4.6–6.5)

## 2019-02-24 LAB — HIV ANTIBODY (ROUTINE TESTING W REFLEX): HIV 1&2 Ab, 4th Generation: NONREACTIVE

## 2019-02-25 LAB — TSH: TSH: 2.38 u[IU]/mL (ref 0.35–4.50)

## 2019-02-25 LAB — VITAMIN D 25 HYDROXY (VIT D DEFICIENCY, FRACTURES): VITD: 39.9 ng/mL (ref 30.00–100.00)

## 2019-02-28 NOTE — Telephone Encounter (Signed)
Form copied and sent to scan. Faxed form to number on form. Copy placed up front for patient to pick up.

## 2019-03-09 ENCOUNTER — Ambulatory Visit: Payer: 59 | Admitting: Physician Assistant

## 2019-04-11 ENCOUNTER — Ambulatory Visit (INDEPENDENT_AMBULATORY_CARE_PROVIDER_SITE_OTHER): Payer: 59 | Admitting: Physician Assistant

## 2019-04-11 ENCOUNTER — Encounter: Payer: Self-pay | Admitting: Physician Assistant

## 2019-04-11 DIAGNOSIS — F331 Major depressive disorder, recurrent, moderate: Secondary | ICD-10-CM

## 2019-04-11 DIAGNOSIS — F411 Generalized anxiety disorder: Secondary | ICD-10-CM | POA: Diagnosis not present

## 2019-04-11 DIAGNOSIS — G4700 Insomnia, unspecified: Secondary | ICD-10-CM

## 2019-04-11 NOTE — Progress Notes (Signed)
[facility] Med Check  Patient ID: Dawn Scott,  MRN: [id number]  PCP: Ma Hillock, DO  Date of Evaluation: 04/11/2019 Time spent:15 minutes  Chief Complaint:  Chief Complaint    Follow-up     Virtual Visit via Telephone Note  I connected with patient by a video enabled telemedicine application or telephone, with their informed consent, and verified patient privacy and that I am speaking with the correct person using two identifiers.  I am private, in my office and the patient is at work.  I discussed the limitations, risks, security and privacy concerns of performing an evaluation and management service by telephone and the availability of in person appointments. I also discussed with the patient that there may be a patient responsible charge related to this service. The patient expressed understanding and agreed to proceed.   I discussed the assessment and treatment plan with the patient. The patient was provided an opportunity to ask questions and all were answered. The patient agreed with the plan and demonstrated an understanding of the instructions.   The patient was advised to call back or seek an in-person evaluation if the symptoms worsen or if the condition fails to improve as anticipated.  I provided 15 minutes of non-face-to-face time during this encounter.  HISTORY/CURRENT STATUS: HPI For routine med check.   Doing a lot better since we increased the Wellbutrin in Sept.  She was able to tell within a few weeks that it was helping. Patient denies loss of interest in usual activities and is able to enjoy things.  Denies decreased energy or motivation.  Appetite has not changed.  No extreme sadness, tearfulness, or feelings of hopelessness.  Denies any changes in concentration, making decisions or remembering things.  Denies suicidal or homicidal thoughts.  Patient denies increased energy with decreased need for sleep, no increased talkativeness, no racing thoughts,  no impulsivity or risky behaviors, no increased spending, no increased libido, no grandiosity.  Sleeps well.  The increase in Wellbutrin has not caused insomnia.  Work is going well.  Her daughter is doing fine.  Patient is not having any abnormal anxiety.  Denies dizziness, syncope, seizures, numbness, tingling, tremor, tics, unsteady gait, slurred speech, confusion. Denies muscle or joint pain, stiffness, or dystonia.  Individual Medical History/ Review of Systems: Changes? :No    Past medications for mental health diagnoses include: Klonopin, Prozac, Prosom, Ambien, Cymbalta, Wellbutrin, Lamictal  Allergies: Augmentin [amoxicillin-pot clavulanate]  Current Medications:  Current Outpatient Medications:  .  Biotin 1 MG CAPS, Take by mouth., Disp: , Rfl:  .  buPROPion (WELLBUTRIN XL) 300 MG 24 hr tablet, Take 1 tablet (300 mg total) by mouth daily., Disp: 90 tablet, Rfl: 3 .  Cholecalciferol (VITAMIN D3) 2000 units TABS, Take 1 tablet by mouth every morning., Disp: , Rfl:  .  Cyanocobalamin (B-12 PO), Take 1 tablet by mouth every morning. , Disp: , Rfl:  .  DULoxetine (CYMBALTA) 30 MG capsule, Take 1 capsule (30 mg total) by mouth daily., Disp: 90 capsule, Rfl: 3 .  DULoxetine (CYMBALTA) 60 MG capsule, Take 1 capsule (60 mg total) by mouth daily., Disp: 90 capsule, Rfl: 3 .  folic acid (FOLVITE) 1 MG tablet, Take 1 mg by mouth daily., Disp: , Rfl:  .  hydrochlorothiazide (HYDRODIURIL) 12.5 MG tablet, Take 1 tablet (12.5 mg total) by mouth daily., Disp: 90 tablet, Rfl: 3 .  lamoTRIgine (LAMICTAL) 150 MG tablet, Take 1 tablet (150 mg total) by mouth every evening., Disp: 90  tablet, Rfl: 3 .  loratadine (CLARITIN) 10 MG tablet, Take 10 mg by mouth every evening. , Disp: , Rfl:  .  Multiple Vitamins-Minerals (MULTIVITAMIN WITH MINERALS) tablet, Take 1 tablet by mouth every evening. , Disp: , Rfl:  .  Probiotic Product (DIGESTIVE ADVANTAGE) CAPS, Take 1 capsule by mouth daily., Disp: , Rfl:   Medication Side Effects: none  Family Medical/ Social History: Changes? 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 "I'm still getting used to them."   MENTAL HEALTH EXAM:  There were no vitals taken for this visit.There is no height or weight on file to calculate BMI.  General Appearance: Unable to assess  Eye Contact:  Unable to assess  Speech:  Clear and Coherent  Volume:  Normal  Mood:  Euthymic  Affect:  Unable to assess  Thought Process:  Goal Directed and Descriptions of Associations: Intact  Orientation:  Full (Time, Place, and Person)  Thought Content: Logical   Suicidal Thoughts:  No  Homicidal Thoughts:  No  Memory:  WNL  Judgement:  Good  Insight:  Good  Psychomotor Activity:  Normal  Concentration:  Concentration: Good  Recall:  Good  Fund of Knowledge: Good  Language: Good  Assets:  Desire for Improvement  ADL's:  Intact  Cognition: WNL  Prognosis:  Good    DIAGNOSES:    ICD-10-CM   1. Major depressive disorder, recurrent episode, moderate (HCC)  F33.1   2. Generalized anxiety disorder  F41.1   3. Insomnia, unspecified type  G47.00     Receiving Psychotherapy: No    RECOMMENDATIONS:  I am glad to see her doing so well! Continue Wellbutrin XL 300 mg every morning. Continue Cymbalta 30 mg +60 mg. Continue Lamictal 150 mg daily. Continue multivitamin, folic acid, 123456, D3. Return in 6 months.  Donnal Moat, PA-C

## 2019-05-06 ENCOUNTER — Telehealth: Payer: Self-pay

## 2019-05-06 MED ORDER — HYDROCHLOROTHIAZIDE 12.5 MG PO TABS: 12.5000 mg | ORAL_TABLET | Freq: Every day | ORAL | 0 refills | Status: DC

## 2019-05-06 NOTE — Telephone Encounter (Signed)
Pt called after hours on 05/06/2019 @ 0754 stating her BP medication has gotten lost in the mail and need RX sent in today to local pharmacy. Spoke with patient and They have sent a new RX to her from [facility] but can take up to a week, maybe longer with the mail service delyaed at the moment. 2 week supply sent to local pharmacy.

## 2019-05-09 ENCOUNTER — Other Ambulatory Visit: Payer: Self-pay

## 2019-05-10 ENCOUNTER — Ambulatory Visit (INDEPENDENT_AMBULATORY_CARE_PROVIDER_SITE_OTHER): Payer: 59 | Admitting: Obstetrics & Gynecology

## 2019-05-10 ENCOUNTER — Encounter: Payer: Self-pay | Admitting: Obstetrics & Gynecology

## 2019-05-10 VITALS — BP 126/82

## 2019-05-10 DIAGNOSIS — R87810 Cervical high risk human papillomavirus (HPV) DNA test positive: Secondary | ICD-10-CM

## 2019-05-12 LAB — PAP, TP IMAGING W/ HPV RNA, RFLX HPV TYPE 16,18/45: HPV DNA High Risk: DETECTED — AB

## 2019-05-13 ENCOUNTER — Encounter: Payer: Self-pay | Admitting: Obstetrics & Gynecology

## 2019-05-13 NOTE — Patient Instructions (Signed)
1. Cervical high risk human papillomavirus (HPV) DNA test positive Colposcopy in July 2019 showed koilocytotic atypia.  HPV high-risk was positive, but HPV 16/18/45 were negative.  Pap test in June 2020 was negative but HPV high-risk was still positive.  Repeat Pap reflex today. - PAP,TP IMGw/HPV RNA,rflx [id number]  Dawn Scott, it was a pleasure seeing you today!  I will inform you of your results as soon as they are available.

## 2019-05-13 NOTE — Progress Notes (Signed)
    Dawn Scott March 03, 1971 [id number]        49 y.o.  G2P0011 Widowed.    RP: HPV HR positive for 6 month Pap test  HPI: H/O LGSIL/HPV HR pos 09/2017.  Colpo 10/2017 Koilocytotic atypia.  HPV 16-18-45 Negative.  Pap 62020 Negative.  HPV HR positive.  Currently abstinent.   OB History  Gravida Para Term Preterm AB Living  2 1     1 1   SAB TAB Ectopic Multiple Live Births      1        # Outcome Date GA Lbr Len/2nd Weight Sex Delivery Anes PTL Lv  2 Ectopic           1 Para             Past medical history,surgical history, problem list, medications, allergies, family history and social history were all reviewed and documented in the EPIC chart.   Directed ROS with pertinent positives and negatives documented in the history of present illness/assessment and plan.  Exam:  Vitals:   05/10/19 1057  BP: 126/82   General appearance:  Normal  Abdomen: Normal  Gynecologic exam: Vulva normal.  Speculum: Cervix and vagina normal.  Vaginal secretions normal.  Pap reflex done on the cervix.   Assessment/Plan:  49 y.o. G2P0011   1. Cervical high risk human papillomavirus (HPV) DNA test positive Colposcopy in July 2019 showed koilocytotic atypia.  HPV high-risk was positive, but HPV 16/18/45 were negative.  Pap test in June 2020 was negative but HPV high-risk was still positive.  Repeat Pap reflex today. - PAP,TP IMGw/HPV RNA,rflx [id number]  Counseling on above issues and coordination of care more than 50% for 15 minutes.  Princess Bruins MD, 8:31 PM 05/13/2019

## 2019-05-23 ENCOUNTER — Encounter: Payer: Self-pay | Admitting: Obstetrics & Gynecology

## 2019-06-10 ENCOUNTER — Ambulatory Visit: Payer: 59 | Admitting: Obstetrics & Gynecology

## 2019-06-27 ENCOUNTER — Other Ambulatory Visit: Payer: Self-pay

## 2019-06-29 ENCOUNTER — Encounter: Payer: Self-pay | Admitting: Obstetrics & Gynecology

## 2019-06-29 ENCOUNTER — Other Ambulatory Visit: Payer: Self-pay

## 2019-06-29 ENCOUNTER — Ambulatory Visit (INDEPENDENT_AMBULATORY_CARE_PROVIDER_SITE_OTHER): Payer: 59 | Admitting: Obstetrics & Gynecology

## 2019-06-29 VITALS — BP 146/90

## 2019-06-29 DIAGNOSIS — N870 Mild cervical dysplasia: Secondary | ICD-10-CM | POA: Diagnosis not present

## 2019-06-29 DIAGNOSIS — R87612 Low grade squamous intraepithelial lesion on cytologic smear of cervix (LGSIL): Secondary | ICD-10-CM

## 2019-06-29 DIAGNOSIS — R87810 Cervical high risk human papillomavirus (HPV) DNA test positive: Secondary | ICD-10-CM

## 2019-06-29 NOTE — Patient Instructions (Signed)
1. LGSIL on Pap smear of cervix Recurrence of LGSIL on recent Pap test January 2021 with high-risk HPV positive.  Colposcopy procedure performed today.  Colposcopy findings reviewed with patient and precaution postprocedure discussed.  Management per results.  2. Cervical high risk human papillomavirus (HPV) DNA test positive HPV 16-18-45 tested today.  Other orders - Pathology Report ([facility]) - HPV Type 16 and 18/45 RNA  Shakonda, it was a pleasure seeing you today!  I will inform you of your results as soon as they are available.

## 2019-06-29 NOTE — Progress Notes (Signed)
    Dawn Scott 1971/02/21 [id number]        49 y.o.  G2P0011   RP: LGSIL/HPV HR positive on 05/11/2019  HPI: H/O LGSIL/HPV HR pos 09/2017.  Colpo 10/2017 Koilocytotic atypia.  Had a Negative Pap, but HPV HR pos in 09/2018.   OB History  Gravida Para Term Preterm AB Living  2 1     1 1   SAB TAB Ectopic Multiple Live Births      1        # Outcome Date GA Lbr Len/2nd Weight Sex Delivery Anes PTL Lv  2 Ectopic           1 Para             Past medical history,surgical history, problem list, medications, allergies, family history and social history were all reviewed and documented in the EPIC chart.   Directed ROS with pertinent positives and negatives documented in the history of present illness/assessment and plan.  Exam:  Vitals:   06/29/19 1459  BP: (!) 146/90   General appearance:  Normal  Colposcopy Procedure Note Dawn Scott 06/29/2019  Indications: LGSIL/HPV HR positive  Procedure Details  The risks and benefits of the procedure and Verbal informed consent obtained.  Speculum placed in vagina and excellent visualization of cervix achieved, cervix swabbed x 3 with acetic acid solution.  Findings:  Cervix colposcopy:  Physical Exam Genitourinary:       Vaginal colposcopy: Normal  Vulvar colposcopy: Normal  Perirectal colposcopy: Normal  The cervix was sprayed with Hurricane before performing the cervical biopsies.  Specimens: HPV 16-18-45.  Cervical Bx at 10 O'Clock.  Complications:  None, good hemostasis with Silver Nitrate. . Plan:  Management per results.    Assessment/Plan:  49 y.o. G2P0011   1. LGSIL on Pap smear of cervix Recurrence of LGSIL on recent Pap test January 2021 with high-risk HPV positive.  Colposcopy procedure performed today.  Colposcopy findings reviewed with patient and precaution postprocedure discussed.  Management per results.  2. Cervical high risk human papillomavirus (HPV) DNA test positive HPV 16-18-45 tested  today.  Other orders - Pathology Report ([facility]) - HPV Type 16 and 18/45 RNA  Princess Bruins MD, 3:20 PM 06/29/2019

## 2019-07-01 LAB — PATHOLOGY REPORT

## 2019-07-01 LAB — TISSUE SPECIMEN

## 2019-07-01 LAB — HPV TYPE 16 AND 18/45 RNA
HPV Type 16 RNA: NOT DETECTED
HPV Type 18/45 RNA: NOT DETECTED

## 2019-07-14 ENCOUNTER — Ambulatory Visit: Payer: 59 | Attending: Internal Medicine

## 2019-07-14 DIAGNOSIS — Z23 Encounter for immunization: Secondary | ICD-10-CM

## 2019-07-14 NOTE — Progress Notes (Signed)
   Covid-19 Vaccination Clinic  Name:  Dawn Scott    MRN: [id number] DOB: 07-Jul-1970  07/14/2019  [name] was observed post Covid-19 immunization for 15 minutes without incident. She was provided with Vaccine Information Sheet and instruction to access the V-Safe system.   [name] was instructed to call 911 with any severe reactions post vaccine: Marland Kitchen Difficulty breathing  . Swelling of face and throat  . A fast heartbeat  . A bad rash all over body  . Dizziness and weakness   Immunizations Administered    Name Date Dose VIS Date Route   Pfizer COVID-19 Vaccine 07/14/2019 11:58 AM 0.3 mL 04/08/2019 Intramuscular   Manufacturer: [place]   Lot: [id number]   [place]: [id number]

## 2019-08-08 ENCOUNTER — Ambulatory Visit: Payer: 59 | Attending: Internal Medicine

## 2019-08-08 DIAGNOSIS — Z23 Encounter for immunization: Secondary | ICD-10-CM

## 2019-08-08 NOTE — Progress Notes (Signed)
   Covid-19 Vaccination Clinic  Name:  Dawn Scott    MRN: [id number] DOB: [birth year]  08/08/2019  [name] was observed post Covid-19 immunization for 15 minutes without incident. She was provided with Vaccine Information Sheet and instruction to access the V-Safe system.   [name] was instructed to call 911 with any severe reactions post vaccine: Marland Kitchen Difficulty breathing  . Swelling of face and throat  . A fast heartbeat  . A bad rash all over body  . Dizziness and weakness   Immunizations Administered    Name Date Dose VIS Date Route   Pfizer COVID-19 Vaccine 08/08/2019  4:48 PM 0.3 mL 04/08/2019 Intramuscular   Manufacturer: [place]   Lot: [id number]   [place]: [id number]

## 2019-08-15 ENCOUNTER — Ambulatory Visit: Payer: 59 | Admitting: Physician Assistant

## 2019-10-14 ENCOUNTER — Ambulatory Visit: Payer: 59 | Admitting: Physician Assistant

## 2019-10-27 ENCOUNTER — Ambulatory Visit (INDEPENDENT_AMBULATORY_CARE_PROVIDER_SITE_OTHER): Payer: 59 | Admitting: Obstetrics & Gynecology

## 2019-10-27 ENCOUNTER — Encounter: Payer: Self-pay | Admitting: Obstetrics & Gynecology

## 2019-10-27 ENCOUNTER — Other Ambulatory Visit: Payer: Self-pay

## 2019-10-27 VITALS — BP 126/80 | Ht 68.0 in | Wt 193.0 lb

## 2019-10-27 DIAGNOSIS — N870 Mild cervical dysplasia: Secondary | ICD-10-CM | POA: Diagnosis not present

## 2019-10-27 DIAGNOSIS — R87810 Cervical high risk human papillomavirus (HPV) DNA test positive: Secondary | ICD-10-CM

## 2019-10-27 DIAGNOSIS — Z789 Other specified health status: Secondary | ICD-10-CM | POA: Diagnosis not present

## 2019-10-27 DIAGNOSIS — Z01419 Encounter for gynecological examination (general) (routine) without abnormal findings: Secondary | ICD-10-CM

## 2019-10-27 DIAGNOSIS — E663 Overweight: Secondary | ICD-10-CM

## 2019-10-27 NOTE — Progress Notes (Signed)
Dawn Scott 1970/05/26 [id number]   History:    49 y.o. G2P1A1L1 Widowed.  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  RP:  Established patient presenting for annual gyn exam   HPI: Very light regular menses post endometrial ablation.  No breakthrough bleeding. No pelvic pain.  No abnormal vaginal discharge.  Abstinent currently.  Urine and bowel movements normal.  Breasts normal.  Body mass index 29.35.  Walking the dog.  Health labs with family physician.  Past medical history,surgical history, family history and social history were all reviewed and documented in the EPIC chart.  Gynecologic History Patient's last menstrual period was 09/10/2019.  Obstetric History OB History  Gravida Para Term Preterm AB Living  2 1     1 1   SAB TAB Ectopic Multiple Live Births      1        # Outcome Date GA Lbr Len/2nd Weight Sex Delivery Anes PTL Lv  2 Ectopic           1 Para              ROS: A ROS was performed and pertinent positives and negatives are included in the history.  GENERAL: No fevers or chills. HEENT: No change in vision, no earache, sore throat or sinus congestion. NECK: No pain or stiffness. CARDIOVASCULAR: No chest pain or pressure. No palpitations. PULMONARY: No shortness of breath, cough or wheeze. GASTROINTESTINAL: No abdominal pain, nausea, vomiting or diarrhea, melena or bright red blood per rectum. GENITOURINARY: No urinary frequency, urgency, hesitancy or dysuria. MUSCULOSKELETAL: No joint or muscle pain, no back pain, no recent trauma. DERMATOLOGIC: No rash, no itching, no lesions. ENDOCRINE: No polyuria, polydipsia, no heat or cold intolerance. No recent change in weight. HEMATOLOGICAL: No anemia or easy bruising or bleeding. NEUROLOGIC: No headache, seizures, numbness, tingling or weakness. PSYCHIATRIC: No depression, no loss of interest in normal activity or change in sleep pattern.     Exam:   BP 126/80 (BP Location: Right Arm, Patient Position: Sitting, Cuff  Size: Normal)   Ht 5\' 8"  (1.727 m)   Wt 193 lb (87.5 kg)   LMP 09/10/2019   BMI 29.35 kg/m   Body mass index is 29.35 kg/m.  General appearance : Well developed well nourished female. No acute distress HEENT: Eyes: no retinal hemorrhage or exudates,  Neck supple, trachea midline, no carotid bruits, no thyroidmegaly Lungs: Clear to auscultation, no rhonchi or wheezes, or rib retractions  Heart: Regular rate and rhythm, no murmurs or gallops Breast:Examined in sitting and supine position were symmetrical in appearance, no palpable masses or tenderness,  no skin retraction, no nipple inversion, no nipple discharge, no skin discoloration, no axillary or supraclavicular lymphadenopathy Abdomen: no palpable masses or tenderness, no rebound or guarding Extremities: no edema or skin discoloration or tenderness  Pelvic: Vulva: Normal             Vagina: No gross lesions or discharge  Cervix: No gross lesions or discharge  Uterus AV, normal size, shape and consistency, non-tender and mobile  Adnexa  Without masses or tenderness  Anus: Normal   Assessment/Plan:  49 y.o. female for annual exam   1. Well female exam with routine gynecological exam Normal gynecologic exam.  Will be due for repeat Pap test in 6 months.  Breast exam normal.  Screening mammogram January 2021 was negative.  Health labs with family physician.  2. Use of condoms for contraception  3. Cervical high  risk human papillomavirus (HPV) DNA test positive CIN 1 on Colpo 06/2019 with HPV 16-18-45 Negative.  F/U 6 month repeat Pap.  4. Dysplasia of cervix, low grade (CIN 1) We will repeat a Pap test in 6 months.  5. Overweight (BMI 25.0-29.9) Recommend a lower calorie/carb diet such as [employer].  Aerobic activities 5 times a week and light weightlifting every 2 days.  Dawn Bruins MD, 4:05 PM 10/27/2019

## 2019-10-28 ENCOUNTER — Ambulatory Visit (INDEPENDENT_AMBULATORY_CARE_PROVIDER_SITE_OTHER): Payer: 59 | Admitting: Physician Assistant

## 2019-10-28 ENCOUNTER — Encounter: Payer: Self-pay | Admitting: Physician Assistant

## 2019-10-28 DIAGNOSIS — F411 Generalized anxiety disorder: Secondary | ICD-10-CM | POA: Diagnosis not present

## 2019-10-28 DIAGNOSIS — F3341 Major depressive disorder, recurrent, in partial remission: Secondary | ICD-10-CM

## 2019-10-28 DIAGNOSIS — G4700 Insomnia, unspecified: Secondary | ICD-10-CM

## 2019-10-28 MED ORDER — DULOXETINE HCL 60 MG PO CPEP: 60.0000 mg | ORAL_CAPSULE | Freq: Every day | ORAL | 3 refills | Status: DC

## 2019-10-28 MED ORDER — DULOXETINE HCL 30 MG PO CPEP: 30.0000 mg | ORAL_CAPSULE | Freq: Every day | ORAL | 3 refills | Status: DC

## 2019-10-28 MED ORDER — BUPROPION HCL ER (XL) 300 MG PO TB24: 300.0000 mg | ORAL_TABLET | Freq: Every day | ORAL | 3 refills | Status: DC

## 2019-10-28 MED ORDER — LAMOTRIGINE 150 MG PO TABS: 150.0000 mg | ORAL_TABLET | Freq: Every evening | ORAL | 3 refills | Status: DC

## 2019-10-28 NOTE — Progress Notes (Signed)
[facility]  Patient ID: Dawn TROXLER,  MRN: [id number]  PCP: Ma Hillock, DO  Date of Evaluation: 10/28/2019 Time spent:20 minutes  Chief Complaint:  Chief Complaint    Follow-up      HISTORY/CURRENT STATUS: HPI For routine med check.  Was here last Dec and since then, she's been doing really well. Work is good.  Her daughter is going into the 11th grade this year.  "I am doing fine."  Patient denies loss of interest in usual activities and is able to enjoy things.  Denies decreased energy or motivation.  Appetite has not changed.  No extreme sadness, tearfulness, or feelings of hopelessness.  Denies any changes in concentration, making decisions or remembering things.  She sleeps well.  Denies suicidal or homicidal thoughts.  Not having a lot of anxiety.  Of course sometimes if there is a trigger, she can get anxious but it goes away quickly.  Denies dizziness, syncope, seizures, numbness, tingling, tremor, tics, unsteady gait, slurred speech, confusion. Denies muscle or joint pain, stiffness, or dystonia.  Individual Medical History/ Review of Systems: Changes? :No    Past medications for mental health diagnoses include: Klonopin, Prozac, Prosom, Ambien, Cymbalta, Wellbutrin, Lamictal  Allergies: Augmentin [amoxicillin-pot clavulanate]  Current Medications:  Current Outpatient Medications:  .  Biotin 1 MG CAPS, Take by mouth., Disp: , Rfl:  .  buPROPion (WELLBUTRIN XL) 300 MG 24 hr tablet, Take 1 tablet (300 mg total) by mouth daily., Disp: 90 tablet, Rfl: 3 .  Cholecalciferol (VITAMIN D3) 2000 units TABS, Take 1 tablet by mouth every morning., Disp: , Rfl:  .  Cyanocobalamin (B-12 PO), Take 1 tablet by mouth every morning. , Disp: , Rfl:  .  DULoxetine (CYMBALTA) 30 MG capsule, Take 1 capsule (30 mg total) by mouth daily., Disp: 90 capsule, Rfl: 3 .  DULoxetine (CYMBALTA) 60 MG capsule, Take 1 capsule (60 mg total) by mouth daily., Disp: 90 capsule, Rfl:  3 .  folic acid (FOLVITE) 1 MG tablet, Take 1 mg by mouth daily., Disp: , Rfl:  .  hydrochlorothiazide (HYDRODIURIL) 12.5 MG tablet, Take 1 tablet (12.5 mg total) by mouth daily., Disp: 14 tablet, Rfl: 0 .  lamoTRIgine (LAMICTAL) 150 MG tablet, Take 1 tablet (150 mg total) by mouth every evening., Disp: 90 tablet, Rfl: 3 .  loratadine (CLARITIN) 10 MG tablet, Take 10 mg by mouth every evening. , Disp: , Rfl:  .  Multiple Vitamins-Minerals (MULTIVITAMIN WITH MINERALS) tablet, Take 1 tablet by mouth every evening. , Disp: , Rfl:  .  Probiotic Product (DIGESTIVE ADVANTAGE) CAPS, Take 1 capsule by mouth daily., Disp: , Rfl:  Medication Side Effects: none  Family Medical/ Social History: Changes? Yes, talking about going back into the office full time. She has been going in several days a week since COVID started.   MENTAL HEALTH EXAM:  There were no vitals taken for this visit.There is no height or weight on file to calculate BMI.  General Appearance: Casual, Neat and Well Groomed  Eye Contact:  Good  Speech:  Clear and Coherent and Normal Rate  Volume:  Normal  Mood:  Euthymic  Affect:  Appropriate  Thought Process:  Goal Directed and Descriptions of Associations: Intact  Orientation:  Full (Time, Place, and Person)  Thought Content: Logical   Suicidal Thoughts:  No  Homicidal Thoughts:  No  Memory:  WNL  Judgement:  Good  Insight:  Good  Psychomotor Activity:  Normal  Concentration:  Concentration:  Good and Attention Span: Good  Recall:  Good  Fund of Knowledge: Good  Language: Good  Assets:  Desire for Improvement  ADL's:  Intact  Cognition: WNL  Prognosis:  Good    DIAGNOSES:    ICD-10-CM   1. Recurrent major depressive disorder, in partial remission ([place])  F33.41   2. Generalized anxiety disorder  F41.1   3. Insomnia, unspecified type  G47.00     Receiving Psychotherapy: No    RECOMMENDATIONS:  PDMP was reviewed. I provided 20 minutes of face-to-face time care  during this encounter. I am glad to see her doing so well! Continue Wellbutrin XL 300 mg, 1 p.o. every morning. Continue Cymbalta total of 90 mg daily. Continue Lamictal 150 mg daily. Continue multivitamin, vitamin D, B12, and biotin. Return in 6 months.  Donnal Moat, PA-C

## 2019-10-30 ENCOUNTER — Encounter: Payer: Self-pay | Admitting: Obstetrics & Gynecology

## 2019-10-30 NOTE — Patient Instructions (Signed)
1. Well female exam with routine gynecological exam Normal gynecologic exam.  Will be due for repeat Pap test in 6 months.  Breast exam normal.  Screening mammogram January 2021 was negative.  Health labs with family physician.  2. Use of condoms for contraception  3. Cervical high risk human papillomavirus (HPV) DNA test positive CIN 1 on [place] 06/2019 with HPV 16-18-45 Negative.  F/U 6 month repeat Pap.  4. Dysplasia of cervix, low grade (CIN 1) We will repeat a Pap test in 6 months.  5. Overweight (BMI 25.0-29.9) Recommend a lower calorie/carb diet such as [employer].  Aerobic activities 5 times a week and light weightlifting every 2 days.  Dawn Scott, it was a pleasure seeing you today!

## 2019-12-03 ENCOUNTER — Encounter: Payer: Self-pay | Admitting: Family Medicine

## 2019-12-03 ENCOUNTER — Telehealth (INDEPENDENT_AMBULATORY_CARE_PROVIDER_SITE_OTHER): Payer: 59 | Admitting: Family Medicine

## 2019-12-03 VITALS — Temp 98.1°F | Ht 68.0 in | Wt 198.0 lb

## 2019-12-03 DIAGNOSIS — B029 Zoster without complications: Secondary | ICD-10-CM

## 2019-12-03 MED ORDER — VALACYCLOVIR HCL 1 G PO TABS: 1000.0000 mg | ORAL_TABLET | Freq: Three times a day (TID) | ORAL | 0 refills | Status: AC

## 2019-12-03 NOTE — Progress Notes (Signed)
Established Patient Office Visit  Subjective:  Patient ID: Dawn Scott, female    DOB: 1970-08-31  Age: 49 y.o. MRN: [id number]  CC:  Chief Complaint  Patient presents with  . Rash    Pt c/o rash on rt of back near bra line x 1 week.  Pt explains it, has bumps and it now swollen, itchiness when someone touches it.  Pt has tried cortisone without any relief.    HPI Dawn Scott presents for treatment and evaluation of a rash on her right posterior chest wall that has been present for the last 5 to 6 days.  The rash is sensitive to light touch and itches.  It is not particularly painful or burning.  She noticed the sensitivity to light touch when her daughter touched it.  She denies antecedent pain in this affected area.  She had shingles at age 71 that was acutely painful.  It was not treated at that time and the pain persisted for a long time she remembers.  She has not had the shingles vaccine.  She has been under more stress recently from her job.  Past Medical History:  Diagnosis Date  . Anxiety   . Clostridioides difficile infection   . Depression   . History of ectopic pregnancy    04/ 2002  S/P  LEFT SALPINGECTOMY  . History of endometriosis   . History of ovarian cyst   . Left ovarian cyst   . Menorrhagia with regular cycle   . PONV (postoperative nausea and vomiting)   . Uterine fibroid     Past Surgical History:  Procedure Laterality Date  . DILATATION & CURETTAGE/HYSTEROSCOPY WITH MYOSURE N/A 10/30/2016   Procedure: , REMOVAL IUD, HYSTEROSCOPY, DILATION AND CURETTAGE;  Surgeon: Princess Bruins, MD;  Location: [place];  Service: Gynecology;  Laterality: N/A;  . DILATION AND EVACUATION  03/10/2003   dr Dellis Filbert   retained placental tissue postpartum  . DX LAPAROSCOPY/ LYSIS ADHESIONAS/  FULGERATION ENDOMETRIOSIS/  LEFT SALPINGECTOMY WITH REMOVAL ECOTPIC  08/18/2000   dr Ronita Hipps  . HYSTEROSCOPY WITH NOVASURE N/A 10/30/2016   Procedure: HYSTEROSCOPY WITH  NOVASURE;  Surgeon: Princess Bruins, MD;  Location: [place];  Service: Gynecology;  Laterality: N/A;  . LAPAROSCOPIC APPENDECTOMY  11/30/2007  . SEPTOPLASTY  August 07, 2009  . TONSILLECTOMY AND ADENOIDECTOMY  child  . WISDOM TOOTH EXTRACTION  1996    Family History  Problem Relation Age of Onset  . Depression Mother   . Arthritis Mother   . Heart disease Mother   . Transient ischemic attack Mother   . Alcohol abuse Father   . Depression Father   . Diabetes Father   . Alcohol abuse Maternal Grandfather   . Leukemia Maternal Grandfather   . Early death Maternal Grandfather        leukemia  . Breast cancer Maternal Aunt August 07, 2068  . Early death Maternal Aunt        cancer  . Depression Paternal Uncle   . Transient ischemic attack Maternal Grandmother   . Arthritis Paternal Grandmother   . Dementia Paternal Grandmother   . Alcohol abuse Paternal Grandfather   . Early death Paternal Grandfather     Social History   Socioeconomic History  . Marital status: Widowed    Spouse name: Not on file  . Number of children: Not on file  . Years of education: Not on file  . Highest education level: Not on file  Occupational History  .  Not on file  Tobacco Use  . Smoking status: Never Smoker  . Smokeless tobacco: Never Used  Vaping Use  . Vaping Use: Never used  Substance and Sexual Activity  . Alcohol use: Yes    Comment: very Rare  . Drug use: No  . Sexual activity: Not Currently  Other Topics Concern  . Not on file  Social History Narrative   Widower. Husband died on the job 07/24/2011.   Bachelor of fine arts, currently not working    One child, Designer, jewellery.   No tobacco or drug use, rare use of alcohol.   Drinks caffeinated beverages, takes a daily vitamin   Wears her seatbelt, wears a bicycle, smoke detectors in the home   exercise at least 3 times a week.   Feels safe in her relationships.   Social Determinants of Health   Financial Resource Strain:   . Difficulty of  Paying Living Expenses:   Food Insecurity:   . Worried About Charity fundraiser in the Last Year:   . Arboriculturist in the Last Year:   Transportation Needs:   . Film/video editor (Medical):   Marland Kitchen Lack of Transportation (Non-Medical):   Physical Activity:   . Days of Exercise per Week:   . Minutes of Exercise per Session:   Stress:   . Feeling of Stress :   Social Connections:   . Frequency of Communication with Friends and Family:   . Frequency of Social Gatherings with Friends and Family:   . Attends Religious Services:   . Active Member of Clubs or Organizations:   . Attends Archivist Meetings:   Marland Kitchen Marital Status:   Intimate Partner Violence:   . Fear of Current or Ex-Partner:   . Emotionally Abused:   Marland Kitchen Physically Abused:   . Sexually Abused:     Outpatient Medications Prior to Visit  Medication Sig Dispense Refill  . Biotin 1 MG CAPS Take by mouth.    Marland Kitchen buPROPion (WELLBUTRIN XL) 300 MG 24 hr tablet Take 1 tablet (300 mg total) by mouth daily. 90 tablet 3  . Cholecalciferol (VITAMIN D3) 2000 units TABS Take 1 tablet by mouth every morning.    . Cyanocobalamin (B-12 PO) Take 1 tablet by mouth every morning.     . DULoxetine (CYMBALTA) 30 MG capsule Take 1 capsule (30 mg total) by mouth daily. 90 capsule 3  . DULoxetine (CYMBALTA) 60 MG capsule Take 1 capsule (60 mg total) by mouth daily. 90 capsule 3  . folic acid (FOLVITE) 1 MG tablet Take 1 mg by mouth daily.    . hydrochlorothiazide (HYDRODIURIL) 12.5 MG tablet Take 1 tablet (12.5 mg total) by mouth daily. 14 tablet 0  . lamoTRIgine (LAMICTAL) 150 MG tablet Take 1 tablet (150 mg total) by mouth every evening. 90 tablet 3  . loratadine (CLARITIN) 10 MG tablet Take 10 mg by mouth every evening.     . Multiple Vitamins-Minerals (MULTIVITAMIN WITH MINERALS) tablet Take 1 tablet by mouth every evening.     . Probiotic Product (DIGESTIVE ADVANTAGE) CAPS Take 1 capsule by mouth daily.     No  facility-administered medications prior to visit.    Allergies  Allergen Reactions  . Augmentin [Amoxicillin-Pot Clavulanate] Other (See Comments)    Diarrhea C.diff positive.    ROS Review of Systems  Constitutional: Negative.   Respiratory: Negative.   Cardiovascular: Negative.   Gastrointestinal: Negative.   Skin: Positive for color change and rash.  Psychiatric/Behavioral: The patient is nervous/anxious.       Objective:    Physical Exam Constitutional:      Appearance: Normal appearance.  HENT:     Head: Normocephalic and atraumatic.  Eyes:     General: No scleral icterus.       Right eye: No discharge.        Left eye: No discharge.     Conjunctiva/sclera: Conjunctivae normal.  Pulmonary:     Effort: Pulmonary effort is normal.  Skin:    Coloration: Skin is not jaundiced or pale.       Neurological:     Mental Status: She is alert and oriented to person, place, and time.  Psychiatric:        Mood and Affect: Mood normal.        Behavior: Behavior normal.     Temp 98.1 F (36.7 C) (Tympanic)   Ht 5\' 8"  (1.727 m)   Wt 198 lb (89.8 kg)   BMI 30.11 kg/m  Wt Readings from Last 3 Encounters:  12/03/19 198 lb (89.8 kg)  10/27/19 193 lb (87.5 kg)  02/23/19 203 lb 8 oz (92.3 kg)     Health Maintenance Due  Topic Date Due  . Hepatitis C Screening  Never done  . INFLUENZA VACCINE  11/27/2019    There are no preventive care reminders to display for this patient.  Lab Results  Component Value Date   TSH 2.38 02/23/2019   Lab Results  Component Value Date   WBC 5.9 02/23/2019   HGB 13.2 02/23/2019   HCT 38.5 02/23/2019   MCV 87.7 02/23/2019   PLT 338.0 02/23/2019   Lab Results  Component Value Date   NA 133 (L) 02/23/2019   K 3.9 02/23/2019   CO2 28 02/23/2019   GLUCOSE 68 (L) 02/23/2019   BUN 9 02/23/2019   CREATININE 0.71 02/23/2019   BILITOT 0.3 02/23/2019   ALKPHOS 52 02/23/2019   AST 15 02/23/2019   ALT 9 02/23/2019   PROT 6.7  02/23/2019   ALBUMIN 4.3 02/23/2019   CALCIUM 9.2 02/23/2019   ANIONGAP 8 07/20/2016   GFR 88.18 02/23/2019   Lab Results  Component Value Date   CHOL 222 (H) 02/23/2019   Lab Results  Component Value Date   HDL 58.60 02/23/2019   Lab Results  Component Value Date   LDLCALC 106 (H) 11/20/2014   Lab Results  Component Value Date   TRIG 293.0 (H) 02/23/2019   Lab Results  Component Value Date   CHOLHDL 4 02/23/2019   Lab Results  Component Value Date   HGBA1C 5.0 02/23/2019      Assessment & Plan:   Problem List Items Addressed This Visit    None    Visit Diagnoses    Herpes zoster without complication    -  Primary   Relevant Medications   valACYclovir (VALTREX) 1000 MG tablet      Meds ordered this encounter  Medications  . valACYclovir (VALTREX) 1000 MG tablet    Sig: Take 1 tablet (1,000 mg total) by mouth 3 (three) times daily for 7 days.    Dispense:  21 tablet    Refill:  0    Follow-up: No follow-ups on file.  Patient will complete her course of therapy and follow-up with her primary physician if the rash does not resolve with treatment.  Discussed the possibility of having his vaccine for high in 6 months. Virtual Visit via Video Note  I  connected with Dawn Scott on 12/03/19 at 10:30 AM EDT by a video enabled telemedicine application and verified that I am speaking with the correct person using two identifiers.  Location: Patient: home with family Provider: home   I discussed the limitations of evaluation and management by telemedicine and the availability of in person appointments. The patient expressed understanding and agreed to proceed.  History of Present Illness:    Observations/Objective:   Assessment and Plan:   Follow Up Instructions:    I discussed the assessment and treatment plan with the patient. The patient was provided an opportunity to ask questions and all were answered. The patient agreed with the plan and  demonstrated an understanding of the instructions.   The patient was advised to call back or seek an in-person evaluation if the symptoms worsen or if the condition fails to improve as anticipated.  I provided 30 minutes of non-face-to-face time during this encounter.   Libby Maw, MD  Libby Maw, MD

## 2019-12-06 ENCOUNTER — Telehealth: Payer: Self-pay | Admitting: Family Medicine

## 2019-12-06 DIAGNOSIS — B029 Zoster without complications: Secondary | ICD-10-CM

## 2019-12-06 MED ORDER — GABAPENTIN 400 MG PO CAPS: ORAL_CAPSULE | ORAL | 0 refills | Status: DC

## 2019-12-06 NOTE — Telephone Encounter (Signed)
Please advise message below, will patient need to see [facility] for follow up or is there something she can take/do to help with pain?

## 2019-12-06 NOTE — Telephone Encounter (Signed)
Patient aware and will pick up Rx.  

## 2019-12-06 NOTE — Telephone Encounter (Signed)
Patient called and stated that she saw Dr. Ethelene Hal Saturday for shingles and wanted to see what she could take for pain, please advise. CB is [phone]

## 2019-12-09 ENCOUNTER — Other Ambulatory Visit: Payer: Self-pay | Admitting: Family Medicine

## 2019-12-09 ENCOUNTER — Telehealth: Payer: Self-pay | Admitting: Family Medicine

## 2019-12-09 NOTE — Telephone Encounter (Signed)
OTC lidocaine patches help (or lidocaine creams).

## 2019-12-09 NOTE — Telephone Encounter (Signed)
Called pt and gave her MD message. Pt verbalized understanding. 

## 2019-12-09 NOTE — Telephone Encounter (Signed)
Please advise 

## 2019-12-09 NOTE — Telephone Encounter (Signed)
Patient called in saying she was diagnosed with shingles on Saturday and is taking Gabapentin but it is not helping with her pain. Wanted to know if something could be prescribed or if there is something over the counter to help.

## 2020-04-30 ENCOUNTER — Ambulatory Visit: Payer: 59 | Admitting: Obstetrics & Gynecology

## 2020-05-01 ENCOUNTER — Other Ambulatory Visit: Payer: Self-pay

## 2020-05-01 ENCOUNTER — Encounter: Payer: Self-pay | Admitting: Physician Assistant

## 2020-05-01 ENCOUNTER — Ambulatory Visit (INDEPENDENT_AMBULATORY_CARE_PROVIDER_SITE_OTHER): Payer: 59 | Admitting: Physician Assistant

## 2020-05-01 DIAGNOSIS — F3341 Major depressive disorder, recurrent, in partial remission: Secondary | ICD-10-CM | POA: Diagnosis not present

## 2020-05-01 DIAGNOSIS — F411 Generalized anxiety disorder: Secondary | ICD-10-CM | POA: Diagnosis not present

## 2020-05-01 NOTE — Progress Notes (Signed)
[facility]  Patient ID: Dawn Scott,  MRN: [id number]  PCP: Natalia Leatherwood, DO  Date of Evaluation: 05/01/2020 Time spent:20 minutes  Chief Complaint:  Chief Complaint    Depression; Medication Refill      HISTORY/CURRENT STATUS: HPI For routine med check.  Doing well. She and her dtr went to [place] on vacation right before Christmas and had a great time. Her dtr is now 49 yo and a Montez Hageman in [employer].  Patient denies loss of interest in usual activities and is able to enjoy things.  Denies decreased energy or motivation.  Appetite has not changed.  No extreme sadness, tearfulness, or feelings of hopelessness.  Denies any changes in concentration, making decisions or remembering things.  She sleeps well. Not having anxiety often at all.  Denies suicidal or homicidal thoughts.  Denies dizziness, syncope, seizures, numbness, tingling, tremor, tics, unsteady gait, slurred speech, confusion. Denies muscle or joint pain, stiffness, or dystonia.  Individual Medical History/ Review of Systems: Changes? :No    Past medications for mental health diagnoses include: Klonopin, Prozac, Prosom, Ambien, Cymbalta, Wellbutrin, Lamictal  Allergies: Augmentin [amoxicillin-pot clavulanate]  Current Medications:  Current Outpatient Medications:  .  Biotin 1 MG CAPS, Take by mouth., Disp: , Rfl:  .  buPROPion (WELLBUTRIN XL) 300 MG 24 hr tablet, Take 1 tablet (300 mg total) by mouth daily., Disp: 90 tablet, Rfl: 3 .  Cholecalciferol (VITAMIN D3) 2000 units TABS, Take 1 tablet by mouth every morning., Disp: , Rfl:  .  Cyanocobalamin (B-12 PO), Take 1 tablet by mouth every morning. , Disp: , Rfl:  .  DULoxetine (CYMBALTA) 30 MG capsule, Take 1 capsule (30 mg total) by mouth daily., Disp: 90 capsule, Rfl: 3 .  DULoxetine (CYMBALTA) 60 MG capsule, Take 1 capsule (60 mg total) by mouth daily., Disp: 90 capsule, Rfl: 3 .  folic acid (FOLVITE) 1 MG tablet, Take 1 mg by mouth daily., Disp:  , Rfl:  .  hydrochlorothiazide (HYDRODIURIL) 12.5 MG tablet, TAKE 1 TABLET BY MOUTH  DAILY, Disp: 90 tablet, Rfl: 1 .  lamoTRIgine (LAMICTAL) 150 MG tablet, Take 1 tablet (150 mg total) by mouth every evening., Disp: 90 tablet, Rfl: 3 .  loratadine (CLARITIN) 10 MG tablet, Take 10 mg by mouth every evening. , Disp: , Rfl:  .  Multiple Vitamins-Minerals (MULTIVITAMIN WITH MINERALS) tablet, Take 1 tablet by mouth every evening. , Disp: , Rfl:  .  Probiotic Product (DIGESTIVE ADVANTAGE) CAPS, Take 1 capsule by mouth daily., Disp: , Rfl:  Medication Side Effects: none  Family Medical/ Social History: Changes? Yes, she's back in the office full time now since Nov.   MENTAL HEALTH EXAM:  There were no vitals taken for this visit.There is no height or weight on file to calculate BMI.  General Appearance: Casual, Neat and Well Groomed  Eye Contact:  Good  Speech:  Clear and Coherent and Normal Rate  Volume:  Normal  Mood:  Euthymic  Affect:  Appropriate  Thought Process:  Goal Directed and Descriptions of Associations: Intact  Orientation:  Full (Time, Place, and Person)  Thought Content: Logical   Suicidal Thoughts:  No  Homicidal Thoughts:  No  Memory:  WNL  Judgement:  Good  Insight:  Good  Psychomotor Activity:  Normal  Concentration:  Concentration: Good and Attention Span: Good  Recall:  Good  Fund of Knowledge: Good  Language: Good  Assets:  Desire for Improvement  ADL's:  Intact  Cognition:  WNL  Prognosis:  Good    DIAGNOSES:    ICD-10-CM   1. Recurrent major depressive disorder, in partial remission ([place])  F33.41   2. Generalized anxiety disorder  F41.1     Receiving Psychotherapy: No    RECOMMENDATIONS:  PDMP was reviewed. I provided 20 minutes of face-to-face time during this encounter in which we discussed her diagnosis and treatment options. I am glad to see her doing so well! Continue Wellbutrin XL 300 mg, 1 p.o. every morning. Continue Cymbalta total of 90  mg daily. Continue Lamictal 150 mg daily. Continue multivitamin, vitamin D, B12, and biotin. Return in 6 months.  Donnal Moat, PA-C

## 2020-05-07 ENCOUNTER — Ambulatory Visit: Payer: 59 | Admitting: Obstetrics & Gynecology

## 2020-05-09 ENCOUNTER — Encounter: Payer: Self-pay | Admitting: Obstetrics & Gynecology

## 2020-05-09 ENCOUNTER — Ambulatory Visit (INDEPENDENT_AMBULATORY_CARE_PROVIDER_SITE_OTHER): Payer: 59 | Admitting: Obstetrics & Gynecology

## 2020-05-09 ENCOUNTER — Other Ambulatory Visit: Payer: Self-pay

## 2020-05-09 VITALS — BP 132/86

## 2020-05-09 DIAGNOSIS — R87810 Cervical high risk human papillomavirus (HPV) DNA test positive: Secondary | ICD-10-CM

## 2020-05-09 DIAGNOSIS — R87612 Low grade squamous intraepithelial lesion on cytologic smear of cervix (LGSIL): Secondary | ICD-10-CM

## 2020-05-09 NOTE — Progress Notes (Signed)
    Dawn Scott [birth year] [id number]        50 y.o.  G2P0011   RP: Repeat Pap test  HPI: Last Pap test 04/2019 LGSIL/HPV HR pos.  Colpo 06/2019 CIN 1.  HPV 16-18-45 Negative.   OB History  Gravida Para Term Preterm AB Living  2 1     1 1   SAB IAB Ectopic Multiple Live Births      1        # Outcome Date GA Lbr Len/2nd Weight Sex Delivery Anes PTL Lv  2 Ectopic           1 Para             Past medical history,surgical history, problem list, medications, allergies, family history and social history were all reviewed and documented in the EPIC chart.   Directed ROS with pertinent positives and negatives documented in the history of present illness/assessment and plan.  Exam:  Vitals:   05/09/20 1331  BP: 132/86   General appearance:  Normal  Abdomen: Normal  Gynecologic exam: Vulva normal.  Speculum:  Cervix/Vagina normal.  Vaginal secretions normal.  Pap/HPV HR done.     Assessment/Plan:  50 y.o. G2P0011   1. LGSIL on Pap smear of cervix LGSIL/HPV HR pos in 04/2019.  Colpo CIN 1 with HPV 16-18-45 Neg in 06/2019.  Repeat Pap test done today.  F/U Annual/Gyn exam.  2. Cervical high risk human papillomavirus (HPV) DNA test positive As above.  Princess Bruins MD, 2:18 PM 05/09/2020

## 2020-05-11 LAB — PAP, TP IMAGING W/ HPV RNA, RFLX HPV TYPE 16,18/45: HPV DNA High Risk: NOT DETECTED

## 2020-05-24 ENCOUNTER — Other Ambulatory Visit: Payer: Self-pay | Admitting: Family Medicine

## 2020-05-30 NOTE — Telephone Encounter (Signed)
Called patient and left detailed message in voice mail. Recall placed.

## 2020-06-01 ENCOUNTER — Encounter: Payer: Self-pay | Admitting: Obstetrics & Gynecology

## 2020-06-23 ENCOUNTER — Other Ambulatory Visit: Payer: Self-pay | Admitting: Family Medicine

## 2020-08-03 ENCOUNTER — Encounter: Payer: 59 | Admitting: Family Medicine

## 2020-08-04 ENCOUNTER — Other Ambulatory Visit: Payer: Self-pay | Admitting: Family Medicine

## 2020-08-20 ENCOUNTER — Other Ambulatory Visit: Payer: Self-pay

## 2020-08-20 ENCOUNTER — Ambulatory Visit (INDEPENDENT_AMBULATORY_CARE_PROVIDER_SITE_OTHER): Payer: 59 | Admitting: Family Medicine

## 2020-08-20 ENCOUNTER — Encounter: Payer: Self-pay | Admitting: Family Medicine

## 2020-08-20 VITALS — BP 139/67 | HR 74 | Temp 98.4°F | Ht 69.0 in | Wt 191.0 lb

## 2020-08-20 DIAGNOSIS — Z131 Encounter for screening for diabetes mellitus: Secondary | ICD-10-CM | POA: Diagnosis not present

## 2020-08-20 DIAGNOSIS — Z1159 Encounter for screening for other viral diseases: Secondary | ICD-10-CM | POA: Diagnosis not present

## 2020-08-20 DIAGNOSIS — Z0000 Encounter for general adult medical examination without abnormal findings: Secondary | ICD-10-CM

## 2020-08-20 DIAGNOSIS — Z8249 Family history of ischemic heart disease and other diseases of the circulatory system: Secondary | ICD-10-CM | POA: Insufficient documentation

## 2020-08-20 DIAGNOSIS — E559 Vitamin D deficiency, unspecified: Secondary | ICD-10-CM

## 2020-08-20 DIAGNOSIS — F32A Depression, unspecified: Secondary | ICD-10-CM

## 2020-08-20 DIAGNOSIS — R87612 Low grade squamous intraepithelial lesion on cytologic smear of cervix (LGSIL): Secondary | ICD-10-CM

## 2020-08-20 DIAGNOSIS — Z1211 Encounter for screening for malignant neoplasm of colon: Secondary | ICD-10-CM

## 2020-08-20 DIAGNOSIS — Z8279 Family history of other congenital malformations, deformations and chromosomal abnormalities: Secondary | ICD-10-CM | POA: Insufficient documentation

## 2020-08-20 DIAGNOSIS — E663 Overweight: Secondary | ICD-10-CM | POA: Diagnosis not present

## 2020-08-20 DIAGNOSIS — I10 Essential (primary) hypertension: Secondary | ICD-10-CM

## 2020-08-20 HISTORY — DX: Low grade squamous intraepithelial lesion on cytologic smear of cervix (LGSIL): R87.612

## 2020-08-20 LAB — VITAMIN D 25 HYDROXY (VIT D DEFICIENCY, FRACTURES): VITD: 36.31 ng/mL (ref 30.00–100.00)

## 2020-08-20 LAB — CBC WITH DIFFERENTIAL/PLATELET
Basophils Absolute: 0.1 10*3/uL (ref 0.0–0.1)
Basophils Relative: 1.1 % (ref 0.0–3.0)
Eosinophils Absolute: 0.1 10*3/uL (ref 0.0–0.7)
Eosinophils Relative: 2.5 % (ref 0.0–5.0)
HCT: 39.2 % (ref 36.0–46.0)
Hemoglobin: 13.2 g/dL (ref 12.0–15.0)
Lymphocytes Relative: 26.9 % (ref 12.0–46.0)
Lymphs Abs: 1.4 10*3/uL (ref 0.7–4.0)
MCHC: 33.6 g/dL (ref 30.0–36.0)
MCV: 87.5 fl (ref 78.0–100.0)
Monocytes Absolute: 0.4 10*3/uL (ref 0.1–1.0)
Monocytes Relative: 8.3 % (ref 3.0–12.0)
Neutro Abs: 3.1 10*3/uL (ref 1.4–7.7)
Neutrophils Relative %: 61.2 % (ref 43.0–77.0)
Platelets: 288 10*3/uL (ref 150.0–400.0)
RBC: 4.48 Mil/uL (ref 3.87–5.11)
RDW: 13 % (ref 11.5–15.5)
WBC: 5.1 10*3/uL (ref 4.0–10.5)

## 2020-08-20 LAB — HEMOGLOBIN A1C: Hgb A1c MFr Bld: 5.4 % (ref 4.6–6.5)

## 2020-08-20 LAB — TSH: TSH: 2.31 u[IU]/mL (ref 0.35–4.50)

## 2020-08-20 MED ORDER — HYDROCHLOROTHIAZIDE 12.5 MG PO TABS: 12.5000 mg | ORAL_TABLET | Freq: Every day | ORAL | 1 refills | Status: DC

## 2020-08-20 NOTE — Progress Notes (Signed)
This visit occurred during the SARS-CoV-2 public health emergency.  Safety protocols were in place, including screening questions prior to the visit, additional usage of staff PPE, and extensive cleaning of exam room while observing appropriate contact time as indicated for disinfecting solutions.    Patient ID: Dawn Scott, female  DOB: October 10, 1970, 50 y.o.   MRN: [id number] Patient Care Team    Relationship Specialty Notifications Start End  Ma Hillock, DO PCP - General Family Medicine  06/28/15   Princess Bruins, MD Consulting Physician Obstetrics and Gynecology  06/28/15   Purnell Shoemaker., MD Attending Physician Psychiatry  06/28/15     Chief Complaint  Patient presents with  . Annual Exam    Pt is fasting;     Subjective:  Dawn Scott is a 50 y.o.  Female  present for CPE. All past medical history, surgical history, allergies, family history, immunizations, medications and social history were updated in the electronic medical record today. All recent labs, ED visits and hospitalizations within the last year were reviewed.  Health maintenance:  Colonoscopy: no fhx routine screen at 47, referred to GI today. Mammogram: completed:2022, birads 1. Managed by gyn. Cervical cancer screening: last pap: 2022, LGSIL- results: Dr. Dellis Filbert Immunizations: tdap UTD 2016, Influenza UTD 2020 (encouraged yearly), covid x3 completed Infectious disease screening: HIV screen completed- Hep C collected today. DEXA: routine screen Assistive device: none Oxygen [id number] Patient has a Dental home. Hospitalizations/ED visits: reviewed  HTN/obesity/Fhx HD-mother: Pt reports compliance with HCTZ 12.5 mg QD. Blood pressures ranges at home normal. Patient denies chest pain, shortness of breath or lower extremity edema. Pt does not daily baby ASA. Pt is not prescribed statin. RF: htn, obesity, FHX HD mother.     Depression screen [facility] 2/9 08/20/2020 02/23/2019 06/28/2015  Decreased Interest 1 3  1   Down, Depressed, Hopeless 1 0 0  PHQ - 2 Score 2 3 1   Altered sleeping 1 0 -  Tired, decreased energy 3 0 -  Change in appetite 0 0 -  Feeling bad or failure about yourself  0 0 -  Trouble concentrating 2 0 -  Moving slowly or fidgety/restless 0 0 -  Suicidal thoughts 0 0 -  PHQ-9 Score 8 3 -  Difficult doing work/chores - Not difficult at all -   GAD 7 : Generalized Anxiety Score 08/20/2020 06/28/2015  Nervous, Anxious, on Edge 0 0  Control/stop worrying 0 0  Worry too much - different things 0 0  Trouble relaxing 0 0  Restless 0 -  Easily annoyed or irritable 1 0  Afraid - awful might happen 0 0  Total GAD 7 Score 1 -    Immunization History  Administered Date(s) Administered  . Influenza,inj,Quad PF,6+ Mos 02/04/2013, 01/18/2014, 01/22/2015  . PFIZER(Purple Top)SARS-COV-2 Vaccination 07/14/2019, 08/08/2019, 05/04/2020  . Tdap 11/24/2014    Past Medical History:  Diagnosis Date  . Anxiety   . Bloody stool 07/07/2016  . Clostridioides difficile infection   . Depression   . History of ectopic pregnancy    04/ 2002  S/P  LEFT SALPINGECTOMY  . History of endometriosis   . History of ovarian cyst   . Left ovarian cyst   . Menorrhagia with regular cycle   . PONV (postoperative nausea and vomiting)   . Shingles   . Uterine fibroid    Allergies  Allergen Reactions  . Augmentin [Amoxicillin-Pot Clavulanate] Other (See Comments)    Diarrhea C.diff positive.   Past Surgical  History:  Procedure Laterality Date  . DILATATION & CURETTAGE/HYSTEROSCOPY WITH MYOSURE N/A 10/30/2016   Procedure: , REMOVAL IUD, HYSTEROSCOPY, DILATION AND CURETTAGE;  Surgeon: Princess Bruins, MD;  Location: [place];  Service: Gynecology;  Laterality: N/A;  . DILATION AND EVACUATION  03/10/2003   dr Dellis Filbert   retained placental tissue postpartum  . DX LAPAROSCOPY/ LYSIS ADHESIONAS/  FULGERATION ENDOMETRIOSIS/  LEFT SALPINGECTOMY WITH REMOVAL ECOTPIC  08/18/2000   dr Ronita Hipps  .  HYSTEROSCOPY WITH NOVASURE N/A 10/30/2016   Procedure: HYSTEROSCOPY WITH NOVASURE;  Surgeon: Princess Bruins, MD;  Location: [facility];  Service: Gynecology;  Laterality: N/A;  . LAPAROSCOPIC APPENDECTOMY  11/30/2007  . SEPTOPLASTY  Aug 08, 2009  . TONSILLECTOMY AND ADENOIDECTOMY  child  . WISDOM TOOTH EXTRACTION  1996   Family History  Problem Relation Age of Onset  . Depression Mother   . Arthritis Mother   . Heart disease Mother   . Transient ischemic attack Mother   . Alcohol abuse Father   . Depression Father   . Diabetes Father   . Alcohol abuse Maternal Grandfather   . Leukemia Maternal Grandfather   . Early death Maternal Grandfather        leukemia  . Breast cancer Maternal Aunt 08/08/2068  . Early death Maternal Aunt        cancer  . Depression Paternal Uncle   . Transient ischemic attack Maternal Grandmother   . Arthritis Paternal Grandmother   . Dementia Paternal Grandmother   . Alcohol abuse Paternal Grandfather   . Early death Paternal Grandfather    Social History   Social History Narrative   Widower. Husband died on the job 08/09/2011.   Bachelor of fine arts, currently not working    One child, Designer, jewellery.   No tobacco or drug use, rare use of alcohol.   Drinks caffeinated beverages, takes a daily vitamin   Wears her seatbelt, wears a bicycle, smoke detectors in the home   exercise at least 3 times a week.   Feels safe in her relationships.    Allergies as of 08/20/2020      Reactions   Augmentin [amoxicillin-pot Clavulanate] Other (See Comments)   Diarrhea C.diff positive.      Medication List       Accurate as of August 20, 2020  9:49 AM. If you have any questions, ask your nurse or doctor.        B-12 PO Take 1 tablet by mouth every morning.   Biotin 1 MG Caps Take by mouth.   buPROPion 300 MG 24 hr tablet Commonly known as: WELLBUTRIN XL Take 1 tablet (300 mg total) by mouth daily.   Digestive Advantage Caps Take 1 capsule by mouth  daily.   DULoxetine 60 MG capsule Commonly known as: CYMBALTA Take 1 capsule (60 mg total) by mouth daily.   DULoxetine 30 MG capsule Commonly known as: CYMBALTA Take 1 capsule (30 mg total) by mouth daily.   fluticasone 50 MCG/ACT nasal spray Commonly known as: FLONASE Place into both nostrils.   folic acid 1 MG tablet Commonly known as: FOLVITE Take 1 mg by mouth daily.   hydrochlorothiazide 12.5 MG tablet Commonly known as: HYDRODIURIL Take 1 tablet (12.5 mg total) by mouth daily.   lamoTRIgine 150 MG tablet Commonly known as: LAMICTAL Take 1 tablet (150 mg total) by mouth every evening.   loratadine 10 MG tablet Commonly known as: CLARITIN Take 10 mg by mouth every evening.   multivitamin  with minerals tablet Take 1 tablet by mouth every evening.   Vitamin D3 50 MCG (2000 UT) Tabs Take 1 tablet by mouth every morning.       All past medical history, surgical history, allergies, family history, immunizations andmedications were updated in the EMR today and reviewed under the history and medication portions of their EMR.     No results found for this or any previous visit (from the past 2160 hour(s)).  No results found.   ROS: 14 pt review of systems performed and negative (unless mentioned in an HPI)  Objective: BP 139/67   Pulse 74   Temp 98.4 F (36.9 C) (Oral)   Ht 5\' 9"  (1.753 m)   Wt 191 lb (86.6 kg)   LMP 08/06/2020   SpO2 100%   BMI 28.21 kg/m  Gen: Afebrile. No acute distress. Nontoxic in appearance, well-developed, well-nourished,  Pleasant female.  HENT: AT. [place]. Bilateral TM visualized and normal in appearance, normal external auditory canal. MMM, no oral lesions, adequate dentition. Bilateral nares within normal limits. Throat without erythema, ulcerations or exudates. no Cough on exam, no hoarseness on exam. Eyes:Pupils Equal Round Reactive to light, Extraocular movements intact,  Conjunctiva without redness, discharge or  icterus. Neck/lymp/endocrine: Supple,no lymphadenopathy, no thyromegaly CV: RRR no murmur, no edema, +2/4 P posterior tibialis pulses.  Chest: CTAB, no wheeze, rhonchi or crackles. normal Respiratory effort. normal Air movement. Abd: Soft. flat. NTND. BS present. no Masses palpated. No hepatosplenomegaly. No rebound tenderness or guarding. Skin: no rashes, purpura or petechiae. Warm and well-perfused. Skin intact. Neuro/Msk:  Normal gait. PERLA. EOMi. Alert. Oriented x3.  Cranial nerves II through XII intact. Muscle strength 5/5 upper/lower extremity. DTRs equal bilaterally. Psych: Normal affect, dress and demeanor. Normal speech. Normal thought content and judgment.  No exam data present  Assessment/plan: RIDHI HOFFERT is a 50 y.o. female present for CPE  HTN/obesity/Fhx HD-mother: Stable.  Consider statin and/or cardiac CT for screen.  - CBC with Differential/Platelet - Comprehensive metabolic panel - Lipid panel - TSH F/u 5.5 mos.  Vitamin D deficiency - VITAMIN D 25 Hydroxy (Vit-D Deficiency, Fractures) Depression, unspecified depression type Managed by psychiatry Need for hepatitis C screening test - Hepatitis C antibody Diabetes mellitus screening - Hemoglobin A1c Colon cancer screening - Ambulatory referral to Gastroenterology LGSIL on Pap smear of cervix Managed by GYN  Routine general medical examination at a health care facility Patient was encouraged to exercise greater than 150 minutes a week. Patient was encouraged to choose a diet filled with fresh fruits and vegetables, and lean meats. AVS provided to patient today for education/recommendation on gender specific health and safety maintenance. Colonoscopy: no fhx routine screen at 89, referred to GI today. Mammogram: completed:2022, birads 1. Managed by gyn. Cervical cancer screening: last pap: 2022, LGSIL- results: Dr. Dellis Filbert Immunizations: tdap UTD 2016, Influenza UTD 2020 (encouraged yearly), covid x3  completed Infectious disease screening: HIV screen completed- Hep C collected today. DEXA: routine screen   Return in about 6 months (around 02/12/2021) for [place] (30 min) and 1 yr cpe.  Orders Placed This Encounter  Procedures  . CBC with Differential/Platelet  . Comprehensive metabolic panel  . Hemoglobin A1c  . Lipid panel  . TSH  . VITAMIN D 25 Hydroxy (Vit-D Deficiency, Fractures)  . Hepatitis C antibody  . Ambulatory referral to Gastroenterology   Meds ordered this encounter  Medications  . hydrochlorothiazide (HYDRODIURIL) 12.5 MG tablet    Sig: Take 1 tablet (12.5 mg total)  by mouth daily.    Dispense:  90 tablet    Refill:  1    MUST KEEP UPCOMING APPT    Referral Orders     Ambulatory referral to Gastroenterology   Electronically signed by: Howard Pouch, DO [place]

## 2020-08-20 NOTE — Patient Instructions (Addendum)
Next appt on chronic conditions in 5.5 months.  Physical 1 yr.  We will call you with lab results I have refilled your medication we prescribe.  Call Gastroenterology to schedule your consult with GI for colon cancer screen.   Health Maintenance, Female Adopting a healthy lifestyle and getting preventive care are important in promoting health and wellness. Ask your health care provider about:  The right schedule for you to have regular tests and exams.  Things you can do on your own to prevent diseases and keep yourself healthy. What should I know about diet, weight, and exercise? Eat a healthy diet  Eat a diet that includes plenty of vegetables, fruits, low-fat dairy products, and lean protein.  Do not eat a lot of foods that are high in solid fats, added sugars, or sodium.   Maintain a healthy weight Body mass index (BMI) is used to identify weight problems. It estimates body fat based on height and weight. Your health care provider can help determine your BMI and help you achieve or maintain a healthy weight. Get regular exercise Get regular exercise. This is one of the most important things you can do for your health. Most adults should:  Exercise for at least 150 minutes each week. The exercise should increase your heart rate and make you sweat (moderate-intensity exercise).  Do strengthening exercises at least twice a week. This is in addition to the moderate-intensity exercise.  Spend less time sitting. Even light physical activity can be beneficial. Watch cholesterol and blood lipids Have your blood tested for lipids and cholesterol at 49 years of age, then have this test every 5 years. Have your cholesterol levels checked more often if:  Your lipid or cholesterol levels are high.  You are older than 49 years of age.  You are at high risk for heart disease. What should I know about cancer screening? Depending on your health history and family history, you may need to  have cancer screening at various ages. This may include screening for:  Breast cancer.  Cervical cancer.  Colorectal cancer.  Skin cancer.  Lung cancer. What should I know about heart disease, diabetes, and high blood pressure? Blood pressure and heart disease  High blood pressure causes heart disease and increases the risk of stroke. This is more likely to develop in people who have high blood pressure readings, are of African descent, or are overweight.  Have your blood pressure checked: ? Every 3-5 years if you are 53-40 years of age. ? Every year if you are 68 years old or older. Diabetes Have regular diabetes screenings. This checks your fasting blood sugar level. Have the screening done:  Once every three years after age 66 if you are at a normal weight and have a low risk for diabetes.  More often and at a younger age if you are overweight or have a high risk for diabetes. What should I know about preventing infection? Hepatitis B If you have a higher risk for hepatitis B, you should be screened for this virus. Talk with your health care provider to find out if you are at risk for hepatitis B infection. Hepatitis C Testing is recommended for:  Everyone born from [birth year] through 1964.  Anyone with known risk factors for hepatitis C. Sexually transmitted infections (STIs)  Get screened for STIs, including gonorrhea and chlamydia, if: ? You are sexually active and are younger than 49 years of age. ? You are older than 49 years of age and  your health care provider tells you that you are at risk for this type of infection. ? Your sexual activity has changed since you were last screened, and you are at increased risk for chlamydia or gonorrhea. Ask your health care provider if you are at risk.  Ask your health care provider about whether you are at high risk for HIV. Your health care provider may recommend a prescription medicine to help prevent HIV infection. If you choose to  take medicine to prevent HIV, you should first get tested for HIV. You should then be tested every 3 months for as long as you are taking the medicine. Pregnancy  If you are about to stop having your period (premenopausal) and you may become pregnant, seek counseling before you get pregnant.  Take 400 to 800 micrograms (mcg) of folic acid every day if you become pregnant.  Ask for birth control (contraception) if you want to prevent pregnancy. Osteoporosis and menopause Osteoporosis is a disease in which the bones lose minerals and strength with aging. This can result in bone fractures. If you are 42 years old or older, or if you are at risk for osteoporosis and fractures, ask your health care provider if you should:  Be screened for bone loss.  Take a calcium or vitamin D supplement to lower your risk of fractures.  Be given hormone replacement therapy (HRT) to treat symptoms of menopause. Follow these instructions at home: Lifestyle  Do not use any products that contain nicotine or tobacco, such as cigarettes, e-cigarettes, and chewing tobacco. If you need help quitting, ask your health care provider.  Do not use street drugs.  Do not share needles.  Ask your health care provider for help if you need support or information about quitting drugs. Alcohol use  Do not drink alcohol if: ? Your health care provider tells you not to drink. ? You are pregnant, may be pregnant, or are planning to become pregnant.  If you drink alcohol: ? Limit how much you use to 0-1 drink a day. ? Limit intake if you are breastfeeding.  Be aware of how much alcohol is in your drink. In the [place] one drink equals one 12 oz bottle of beer (355 mL), one 5 oz glass of wine (148 mL), or one 1 oz glass of hard liquor (44 mL). General instructions  Schedule regular health, dental, and eye exams.  Stay current with your vaccines.  Tell your health care provider if: ? You often feel depressed. ? You  have ever been abused or do not feel safe at home. Summary  Adopting a healthy lifestyle and getting preventive care are important in promoting health and wellness.  Follow your health care provider's instructions about healthy diet, exercising, and getting tested or screened for diseases.  Follow your health care provider's instructions on monitoring your cholesterol and blood pressure. This information is not intended to replace advice given to you by your health care provider. Make sure you discuss any questions you have with your health care provider. Document Revised: 04/07/2018 Document Reviewed: 04/07/2018 Elsevier Patient Education  2021 [employer].   If you are age 32 or older, your body mass index should be between 23-30. Your Body mass index is Body mass index is 28.21 kg/m.Marland Kitchen If this is above the aforementioned range listed, you are consider overweight and if BMI > 35 you are conisdered obese, by medical definition and standards. Routine daily exercise and dietary modifications are encouraged. If you would like  additional guidance on weight loss, please make an appointment for weight loss counseling - must be an appointment dedicate to weight loss counseling alone. I would be happy to help you.   If you are age 42 or younger, your body mass index should be between 19-25. Your Body mass index is Body mass index is 28.21 kg/m. Marland Kitchen If this is above the aformentioned range listed, you are consider overwieght and obese if BMI > 30, by medical definition and standards. Routine daily exercise and dietary modifications are encouraged. If you would like additional guidance on weight loss, please make an appointment for weight loss counseling - must be an appointment dedicate to weight loss counseling alone. I would be happy to help you.

## 2020-08-21 ENCOUNTER — Telehealth: Payer: Self-pay | Admitting: Family Medicine

## 2020-08-21 LAB — LIPID PANEL
Cholesterol: 212 mg/dL — ABNORMAL HIGH (ref 0–200)
HDL: 61.7 mg/dL (ref >39.00)
LDL Cholesterol: 130 mg/dL — ABNORMAL HIGH (ref 0–99)
NonHDL: 149.97
Total CHOL/HDL Ratio: 3
Triglycerides: 102 mg/dL (ref 0.0–149.0)
VLDL: 20.4 mg/dL (ref 0.0–40.0)

## 2020-08-21 LAB — COMPREHENSIVE METABOLIC PANEL
ALT: 9 U/L (ref 0–35)
AST: 17 U/L (ref 0–37)
Albumin: 4.4 g/dL (ref 3.5–5.2)
Alkaline Phosphatase: 46 U/L (ref 39–117)
BUN: 9 mg/dL (ref 6–23)
CO2: 27 mEq/L (ref 19–32)
Calcium: 9.6 mg/dL (ref 8.4–10.5)
Chloride: 90 mEq/L — ABNORMAL LOW (ref 96–112)
Creatinine, Ser: 0.78 mg/dL (ref 0.40–1.20)
GFR: 89.68 mL/min (ref >60.00)
Glucose, Bld: 72 mg/dL (ref 70–99)
Potassium: 4.1 mEq/L (ref 3.5–5.1)
Sodium: 128 mEq/L — ABNORMAL LOW (ref 135–145)
Total Bilirubin: 0.4 mg/dL (ref 0.2–1.2)
Total Protein: 7.1 g/dL (ref 6.0–8.3)

## 2020-08-21 LAB — HEPATITIS C ANTIBODY
Hepatitis C Ab: NONREACTIVE
SIGNAL TO CUT-OFF: 0 (ref <1.00)

## 2020-08-21 NOTE — Telephone Encounter (Signed)
Please call patient Liver, kidney and thyroid function are normal Blood cell counts are normal. Vitamin D level was normal at 36. Diabetes screening/A1c is normal at 5.4 Cholesterol panel looks good and is at goal with a LDL/bad cholesterol of 130.  She did have a significantly low sodium level in her blood.  This is new for her at this level of 128.  Some of her medications prescribed by her mental health specialist can cause lower sodium. Low-sodium can cause confusion, fatigue and lead to serious conditions if not managed appropriately.  I would encourage her to drink an electrolyte replacement drink such as Gatorade a few times a day to help replace her sodium.  I would encourage her to call her mental health specialist to inform them and follow-up here in 2 weeks with provider for repeat labs after attempting electrolyte replacements.

## 2020-08-21 NOTE — Telephone Encounter (Signed)
Spoke with pt regarding labs and instructions.   

## 2020-08-29 ENCOUNTER — Encounter: Payer: Self-pay | Admitting: Gastroenterology

## 2020-09-04 ENCOUNTER — Ambulatory Visit (INDEPENDENT_AMBULATORY_CARE_PROVIDER_SITE_OTHER): Payer: 59 | Admitting: Family Medicine

## 2020-09-04 ENCOUNTER — Encounter: Payer: Self-pay | Admitting: Family Medicine

## 2020-09-04 ENCOUNTER — Other Ambulatory Visit: Payer: Self-pay

## 2020-09-04 VITALS — BP 109/75 | HR 88 | Temp 98.7°F | Ht 69.0 in | Wt 192.0 lb

## 2020-09-04 DIAGNOSIS — R531 Weakness: Secondary | ICD-10-CM

## 2020-09-04 DIAGNOSIS — E871 Hypo-osmolality and hyponatremia: Secondary | ICD-10-CM

## 2020-09-04 LAB — BASIC METABOLIC PANEL
BUN: 13 mg/dL (ref 6–23)
CO2: 30 mEq/L (ref 19–32)
Calcium: 9.5 mg/dL (ref 8.4–10.5)
Chloride: 95 mEq/L — ABNORMAL LOW (ref 96–112)
Creatinine, Ser: 0.76 mg/dL (ref 0.40–1.20)
GFR: 92.5 mL/min (ref >60.00)
Glucose, Bld: 115 mg/dL — ABNORMAL HIGH (ref 70–99)
Potassium: 3.4 mEq/L — ABNORMAL LOW (ref 3.5–5.1)
Sodium: 133 mEq/L — ABNORMAL LOW (ref 135–145)

## 2020-09-04 NOTE — Patient Instructions (Signed)
Hyponatremia Hyponatremia is when the amount of salt (sodium) in your blood is too low. When salt levels are low, your body may take in extra water. This can cause swelling throughout the body. The swelling often affects the brain. What are the causes? This condition may be caused by:  Certain medical problems or conditions.  Vomiting a lot.  Having watery poop (diarrhea) often.  Certain medicines or illegal drugs.  Not having enough water in the body (dehydration).  Drinking too much water.  Eating a diet that is low in salt.  Large burns on your body.  Too much sweating. What increases the risk? You are more likely to get this condition if you:  Have long-term (chronic) kidney disease.  Have heart failure.  Have a medical condition that causes you to have watery poop often.  Do very hard exercises.  Take medicines that affect the amount of salt is in your blood. What are the signs or symptoms? Symptoms of this condition include:  Headache.  Feeling like you may vomit (nausea).  Vomiting.  Being very tired (lethargic).  Muscle weakness and cramps.  Not wanting to eat as much as normal (loss of appetite).  Feeling weak or light-headed. Severe symptoms of this condition include:  Confusion.  Feeling restless (agitation).  Having a fast heart rate.  Passing out (fainting).  Seizures.  Coma. How is this treated? Treatment for this condition depends on the cause. Treatment may include:  Getting fluids through an IV tube that is put into one of your veins.  Taking medicines to fix the salt levels in your blood. If medicines are causing the problem, your medicines will need to be changed.  Limiting how much water or fluid you take in.  Monitoring in the hospital to watch your symptoms.   Follow these instructions at home:  Take over-the-counter and prescription medicines only as told by your doctor. Many medicines can make this condition worse.  Talk with your doctor about any medicines that you are taking.  Eat and drink exactly as you are told by your doctor. ? Eat only the foods you are told to eat. ? Limit how much fluid you take.  Do not drink alcohol.  Keep all follow-up visits as told by your doctor. This is important.   Contact a doctor if:  You feel more like you may vomit.  You feel more tired.  Your headache gets worse.  You feel more confused.  You feel weaker.  Your symptoms go away and then they come back.  You have trouble following the diet instructions. Get help right away if:  You have a seizure.  You pass out.  You keep having watery poop.  You keep vomiting. Summary  Hyponatremia is when the amount of salt in your blood is too low.  When salt levels are low, you can have swelling throughout the body. The swelling mostly affects the brain.  Treatment depends on the cause. Treatment may include getting IV fluids, medicines, or not drinking as much fluid. This information is not intended to replace advice given to you by your health care provider. Make sure you discuss any questions you have with your health care provider. Document Revised: 07/01/2018 Document Reviewed: 03/18/2018 Elsevier Patient Education  2021 [employer]  

## 2020-09-04 NOTE — Progress Notes (Signed)
This visit occurred during the SARS-CoV-2 public health emergency.  Safety protocols were in place, including screening questions prior to the visit, additional usage of staff PPE, and extensive cleaning of exam room while observing appropriate contact time as indicated for disinfecting solutions.    Dawn Scott , [birth year], 50 y.o., female MRN: [id number] Patient Care Team    Relationship Specialty Notifications Start End  Ma Hillock, DO PCP - General Family Medicine  06/28/15   Princess Bruins, MD Consulting Physician Obstetrics and Gynecology  06/28/15   Purnell Shoemaker., MD Attending Physician Psychiatry  06/28/15     Chief Complaint  Patient presents with  . low sodium     Subjective: Pt presents for an OV to follow up on her low sodium levels- which was a new finding for her. She was have mental fog and weakness. Since her labs, she has been supplementing with Gatorade 3 bottles a day and some water. She reports she feels MUCH improved and her symptoms have resolved.   Depression screen [facility] 2/9 09/04/2020 08/20/2020 02/23/2019 06/28/2015  Decreased Interest 0 1 3 1   Down, Depressed, Hopeless 0 1 0 0  PHQ - 2 Score 0 2 3 1   Altered sleeping - 1 0 -  Tired, decreased energy - 3 0 -  Change in appetite - 0 0 -  Feeling bad or failure about yourself  - 0 0 -  Trouble concentrating - 2 0 -  Moving slowly or fidgety/restless - 0 0 -  Suicidal thoughts - 0 0 -  PHQ-9 Score - 8 3 -  Difficult doing work/chores - - Not difficult at all -    Allergies  Allergen Reactions  . Augmentin [Amoxicillin-Pot Clavulanate] Other (See Comments)    Diarrhea C.diff positive.   Social History   Social History Narrative   Widower. Husband died on the job 08-07-2011.   Bachelor of fine arts, currently not working    One child, Designer, jewellery.   No tobacco or drug use, rare use of alcohol.   Drinks caffeinated beverages, takes a daily vitamin   Wears her seatbelt, wears a bicycle, smoke  detectors in the home   exercise at least 3 times a week.   Feels safe in her relationships.   Past Medical History:  Diagnosis Date  . Anxiety   . Bloody stool 07/07/2016  . Clostridioides difficile infection   . Depression   . History of ectopic pregnancy    2000/08/06  S/P  LEFT SALPINGECTOMY  . History of endometriosis   . History of ovarian cyst   . Left ovarian cyst   . Menorrhagia with regular cycle   . PONV (postoperative nausea and vomiting)   . Shingles   . Uterine fibroid    Past Surgical History:  Procedure Laterality Date  . DILATATION & CURETTAGE/HYSTEROSCOPY WITH MYOSURE N/A 10/30/2016   Procedure: , REMOVAL IUD, HYSTEROSCOPY, DILATION AND CURETTAGE;  Surgeon: Princess Bruins, MD;  Location: [place];  Service: Gynecology;  Laterality: N/A;  . DILATION AND EVACUATION  03/10/2003   dr Dellis Filbert   retained placental tissue postpartum  . DX LAPAROSCOPY/ LYSIS ADHESIONAS/  FULGERATION ENDOMETRIOSIS/  LEFT SALPINGECTOMY WITH REMOVAL ECOTPIC  08/18/2000   dr Ronita Hipps  . HYSTEROSCOPY WITH NOVASURE N/A 10/30/2016   Procedure: HYSTEROSCOPY WITH NOVASURE;  Surgeon: Princess Bruins, MD;  Location: [place];  Service: Gynecology;  Laterality: N/A;  . LAPAROSCOPIC APPENDECTOMY  11/30/2007  .  SEPTOPLASTY  08-26-09  . TONSILLECTOMY AND ADENOIDECTOMY  child  . WISDOM TOOTH EXTRACTION  1996   Family History  Problem Relation Age of Onset  . Depression Mother   . Arthritis Mother   . Heart disease Mother   . Transient ischemic attack Mother   . Alcohol abuse Father   . Depression Father   . Diabetes Father   . Alcohol abuse Maternal Grandfather   . Leukemia Maternal Grandfather   . Early death Maternal Grandfather        leukemia  . Breast cancer Maternal Aunt Aug 26, 2068  . Early death Maternal Aunt        cancer  . Depression Paternal Uncle   . Transient ischemic attack Maternal Grandmother   . Arthritis Paternal Grandmother   . Dementia Paternal  Grandmother   . Alcohol abuse Paternal Grandfather   . Early death Paternal Grandfather    Allergies as of 09/04/2020      Reactions   Augmentin [amoxicillin-pot Clavulanate] Other (See Comments)   Diarrhea C.diff positive.      Medication List       Accurate as of Sep 04, 2020 11:40 AM. If you have any questions, ask your nurse or doctor.        ALIGN PO Take by mouth. What changed: Another medication with the same name was removed. Continue taking this medication, and follow the directions you see here. Changed by: Howard Pouch, DO   B-12 PO Take 1 tablet by mouth every morning.   Biotin 1 MG Caps Take by mouth.   buPROPion 300 MG 24 hr tablet Commonly known as: WELLBUTRIN XL Take 1 tablet (300 mg total) by mouth daily.   DULoxetine 60 MG capsule Commonly known as: CYMBALTA Take 1 capsule (60 mg total) by mouth daily.   DULoxetine 30 MG capsule Commonly known as: CYMBALTA Take 1 capsule (30 mg total) by mouth daily.   fluticasone 50 MCG/ACT nasal spray Commonly known as: FLONASE Place into both nostrils.   folic acid 1 MG tablet Commonly known as: FOLVITE Take 1 mg by mouth daily.   hydrochlorothiazide 12.5 MG tablet Commonly known as: HYDRODIURIL Take 1 tablet (12.5 mg total) by mouth daily.   lamoTRIgine 150 MG tablet Commonly known as: LAMICTAL Take 1 tablet (150 mg total) by mouth every evening.   loratadine 10 MG tablet Commonly known as: CLARITIN Take 10 mg by mouth every evening.   multivitamin with minerals tablet Take 1 tablet by mouth every evening.   Vitamin D3 50 MCG (2000 UT) Tabs Take 1 tablet by mouth every morning.       All past medical history, surgical history, allergies, family history, immunizations andmedications were updated in the EMR today and reviewed under the history and medication portions of their EMR.     ROS: Negative, with the exception of above mentioned in HPI   Objective:  BP 109/75   Pulse 88   Temp  98.7 F (37.1 C) (Oral)   Ht 5\' 9"  (1.753 m)   Wt 192 lb (87.1 kg)   LMP 08/06/2020   SpO2 93%   BMI 28.35 kg/m  Body mass index is 28.35 kg/m. Gen: Afebrile. No acute distress. Nontoxic in appearance, well developed, well nourished.  HENT: AT. [place].  Eyes:Pupils Equal Round Reactive to light, Extraocular movements intact,  Conjunctiva without redness, discharge or icterus. CV: RRR no murmur, no edema Chest: CTAB, no wheeze or crackles. Good air movement, normal resp effort.  Neuro:  Normal gait. PERLA. EOMi. Alert. Oriented x3. Psych: Normal affect, dress and demeanor. Normal speech. Normal thought content and judgment.  No exam data present No results found. No results found for this or any previous visit (from the past 24 hour(s)).  Assessment/Plan: KADEE PHILYAW is a 51 y.o. female present for OV for  Hyponatremia/weakness Symptoms improved. recheck Levels today. Educated pt on hyponatremia sign, symptoms and tx - Basic metabolic panel - low levels of sodium may be caused by her diet and/or meds. (SSRI and thiazide) - if needed would consider BP control with other agent and further urine/serum studies on osmolality before recommending nay changes to mental health meds that are working well for her.    Reviewed expectations re: course of current medical issues.  Discussed self-management of symptoms.  Outlined signs and symptoms indicating need for more acute intervention.  Patient verbalized understanding and all questions were answered.  Patient received an After-Visit Summary.    Orders Placed This Encounter  Procedures  . Basic metabolic panel   No orders of the defined types were placed in this encounter.  Referral Orders  No referral(s) requested today     Note is dictated utilizing voice recognition software. Although note has been proof read prior to signing, occasional typographical errors still can be missed. If any questions arise, please do not  hesitate to call for verification.   electronically signed by:  Howard Pouch, DO  [place]

## 2020-09-18 ENCOUNTER — Ambulatory Visit (AMBULATORY_SURGERY_CENTER): Payer: Self-pay

## 2020-09-18 ENCOUNTER — Other Ambulatory Visit: Payer: Self-pay

## 2020-09-18 VITALS — Ht 69.0 in | Wt 193.0 lb

## 2020-09-18 DIAGNOSIS — Z1211 Encounter for screening for malignant neoplasm of colon: Secondary | ICD-10-CM

## 2020-09-18 MED ORDER — NA SULFATE-K SULFATE-MG SULF 17.5-3.13-1.6 GM/177ML PO SOLN: 1.0000 | ORAL | 0 refills | Status: DC

## 2020-09-18 NOTE — Progress Notes (Signed)

## 2020-09-26 ENCOUNTER — Other Ambulatory Visit: Payer: Self-pay | Admitting: Physician Assistant

## 2020-10-15 ENCOUNTER — Encounter: Payer: 59 | Admitting: Gastroenterology

## 2020-10-16 ENCOUNTER — Other Ambulatory Visit: Payer: Self-pay | Admitting: Physician Assistant

## 2020-11-01 ENCOUNTER — Ambulatory Visit: Payer: 59 | Admitting: Physician Assistant

## 2020-11-06 ENCOUNTER — Other Ambulatory Visit: Payer: Self-pay

## 2020-11-06 ENCOUNTER — Encounter: Payer: Self-pay | Admitting: Obstetrics & Gynecology

## 2020-11-06 ENCOUNTER — Ambulatory Visit (INDEPENDENT_AMBULATORY_CARE_PROVIDER_SITE_OTHER): Payer: 59 | Admitting: Obstetrics & Gynecology

## 2020-11-06 ENCOUNTER — Encounter: Payer: Self-pay | Admitting: Family Medicine

## 2020-11-06 ENCOUNTER — Other Ambulatory Visit (HOSPITAL_COMMUNITY)
Admission: RE | Admit: 2020-11-06 | Discharge: 2020-11-06 | Disposition: A | Discharge status: Home / Self Care | Payer: 59 | Source: Ambulatory Visit | Attending: Obstetrics & Gynecology | Admitting: Obstetrics & Gynecology

## 2020-11-06 VITALS — BP 116/80 | HR 88 | Resp 16 | Ht 68.5 in | Wt 195.0 lb

## 2020-11-06 DIAGNOSIS — Z01419 Encounter for gynecological examination (general) (routine) without abnormal findings: Secondary | ICD-10-CM | POA: Insufficient documentation

## 2020-11-06 DIAGNOSIS — Z789 Other specified health status: Secondary | ICD-10-CM | POA: Diagnosis not present

## 2020-11-06 DIAGNOSIS — N951 Menopausal and female climacteric states: Secondary | ICD-10-CM | POA: Diagnosis not present

## 2020-11-06 DIAGNOSIS — E663 Overweight: Secondary | ICD-10-CM

## 2020-11-06 DIAGNOSIS — N870 Mild cervical dysplasia: Secondary | ICD-10-CM

## 2020-11-06 NOTE — Progress Notes (Signed)
Dawn Scott 1970-07-28 [id number]   History:    51 y.o. G2P1A1L1 Widowed.  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   RP:  Established patient presenting for annual gyn exam   HPI: Oligomenorrhea, LMP 08/2020.  Hot flushes, better on Black Cohosh.  No pelvic pain.  No abnormal vaginal discharge.  Abstinent currently.  Urine and bowel movements normal.  Breasts normal.  Body mass index 29.22.   Walking the dog.  Health labs with family physician.    Past medical history,surgical history, family history and social history were all reviewed and documented in the EPIC chart.  Gynecologic History Patient's last menstrual period was 09/19/2020.  Obstetric History OB History  Gravida Para Term Preterm AB Living  2 1     1 1   SAB IAB Ectopic Multiple Live Births      1        # Outcome Date GA Lbr Len/2nd Weight Sex Delivery Anes PTL Lv  2 Ectopic           1 Para              ROS: A ROS was performed and pertinent positives and negatives are included in the history.  GENERAL: No fevers or chills. HEENT: No change in vision, no earache, sore throat or sinus congestion. NECK: No pain or stiffness. CARDIOVASCULAR: No chest pain or pressure. No palpitations. PULMONARY: No shortness of breath, cough or wheeze. GASTROINTESTINAL: No abdominal pain, nausea, vomiting or diarrhea, melena or bright red blood per rectum. GENITOURINARY: No urinary frequency, urgency, hesitancy or dysuria. MUSCULOSKELETAL: No joint or muscle pain, no back pain, no recent trauma. DERMATOLOGIC: No rash, no itching, no lesions. ENDOCRINE: No polyuria, polydipsia, no heat or cold intolerance. No recent change in weight. HEMATOLOGICAL: No anemia or easy bruising or bleeding. NEUROLOGIC: No headache, seizures, numbness, tingling or weakness. PSYCHIATRIC: No depression, no loss of interest in normal activity or change in sleep pattern.     Exam:   BP 116/80   Pulse 88   Resp 16   Ht 5' 8.5" (1.74 m)   Wt 195 lb (88.5  kg)   LMP 09/19/2020   BMI 29.22 kg/m   Body mass index is 29.22 kg/m.  General appearance : Well developed well nourished female. No acute distress HEENT: Eyes: no retinal hemorrhage or exudates,  Neck supple, trachea midline, no carotid bruits, no thyroidmegaly Lungs: Clear to auscultation, no rhonchi or wheezes, or rib retractions  Heart: Regular rate and rhythm, no murmurs or gallops Breast:Examined in sitting and supine position were symmetrical in appearance, no palpable masses or tenderness,  no skin retraction, no nipple inversion, no nipple discharge, no skin discoloration, no axillary or supraclavicular lymphadenopathy Abdomen: no palpable masses or tenderness, no rebound or guarding Extremities: no edema or skin discoloration or tenderness  Pelvic: Vulva: Normal             Vagina: No gross lesions or discharge  Cervix: No gross lesions or discharge.  Pap reflex done.  Uterus  AV, normal size, shape and consistency, non-tender and mobile  Adnexa  Without masses or tenderness  Anus: Normal   Assessment/Plan:  50 y.o. female for annual exam   1. Encounter for routine gynecological examination with Papanicolaou smear of cervix Normal gynecologic exam.  History of CIN-1 with Pap test negative and high-risk HPV negative in January 2022.  Pap reflex done today.  Breast exam normal.  Screening mammogram February 2022  was negative.  Health labs with family physician.  2. Perimenopause Oligomenorrhea with last menstrual period in May 2022.  Hot flushes controlled on black cohosh currently.  3. Dysplasia of cervix, low grade (CIN 1) Pap reflex done today.  4. Use of condoms for contraception  5. Overweight (BMI 25.0-29.9)  Recommend a lower calorie/carb diet.  Aerobic activities 5 times a week and light weightlifting every 2 days.  Princess Bruins MD, 12:14 PM 11/06/2020

## 2020-11-08 NOTE — Telephone Encounter (Signed)
Placed on PCP desk  

## 2020-11-09 NOTE — Telephone Encounter (Signed)
Completed and returned to CMA work basket ?

## 2020-11-11 ENCOUNTER — Other Ambulatory Visit: Payer: Self-pay | Admitting: Physician Assistant

## 2020-11-14 LAB — CYTOLOGY - PAP
Comment: NEGATIVE
Diagnosis: UNDETERMINED — AB
High risk HPV: NEGATIVE

## 2020-12-07 ENCOUNTER — Ambulatory Visit (INDEPENDENT_AMBULATORY_CARE_PROVIDER_SITE_OTHER): Payer: 59 | Admitting: Physician Assistant

## 2020-12-07 ENCOUNTER — Encounter: Payer: Self-pay | Admitting: Physician Assistant

## 2020-12-07 ENCOUNTER — Other Ambulatory Visit: Payer: Self-pay

## 2020-12-07 DIAGNOSIS — G4700 Insomnia, unspecified: Secondary | ICD-10-CM

## 2020-12-07 DIAGNOSIS — E871 Hypo-osmolality and hyponatremia: Secondary | ICD-10-CM | POA: Diagnosis not present

## 2020-12-07 DIAGNOSIS — F411 Generalized anxiety disorder: Secondary | ICD-10-CM

## 2020-12-07 DIAGNOSIS — Z79899 Other long term (current) drug therapy: Secondary | ICD-10-CM | POA: Diagnosis not present

## 2020-12-07 DIAGNOSIS — F3342 Major depressive disorder, recurrent, in full remission: Secondary | ICD-10-CM | POA: Diagnosis not present

## 2020-12-07 MED ORDER — VITAMIN D3 50 MCG (2000 UT) PO TABS: 4000.0000 [IU] | ORAL_TABLET | Freq: Every morning | ORAL | 11 refills | Status: AC

## 2020-12-07 NOTE — Progress Notes (Signed)
[facility] Med Check  Patient ID: MYSTERY SCHRUPP,  MRN: [id number]  PCP: Ma Hillock, DO  Date of Evaluation: 12/07/2020 Time spent:45 minutes  Chief Complaint:  Chief Complaint   Depression; Follow-up      HISTORY/CURRENT STATUS: HPI For routine 29-monthmed check.  Doing well.  Work is going fine.  She sleeps well most of the time.  No major changes in social situation.  She is able to enjoy things.  Energy and motivation are good.  She does not isolate.  Appetite is normal and weight is stable.  Not having any anxiety to speak of.  Denies suicidal or homicidal thoughts.  Back last winter she was experiencing a lot of fatigue.  She saw her PCP in April for a routine physical and was found to have a low sodium level.  DSiobahnhad purposely cut way back on any salt products just because her mom has had too many strokes and she was trying to be proactive and prevent something like that in herself.  Her PCP felt like she was not getting enough sodium in her diet and recommended Gatorade and adding salt back in.  The sodium level was drawn about 2 to 3 weeks later and had increased back closer to normal range.  The fatigue that she had been having is much better now.  Patient denies increased energy with decreased need for sleep, no increased talkativeness, no racing thoughts, no impulsivity or risky behaviors, no increased spending, no increased libido, no grandiosity, no increased irritability or anger, and no hallucinations.  Denies dizziness, syncope, seizures, numbness, tingling, tremor, tics, unsteady gait, slurred speech, confusion. Denies muscle or joint pain, stiffness, or dystonia.  Individual Medical History/ Review of Systems: Changes? :Yes   had low Na dx about 3 months ago.   Past medications for mental health diagnoses include: Klonopin, Prozac, Prosom, Ambien, Cymbalta, Wellbutrin, Lamictal   Allergies: Augmentin [amoxicillin-pot clavulanate]  Current Medications:   Current Outpatient Medications:    Biotin 1 MG CAPS, Take by mouth., Disp: , Rfl:    BLACK COHOSH EXTRACT PO, Take by mouth., Disp: , Rfl:    buPROPion (WELLBUTRIN XL) 300 MG 24 hr tablet, TAKE 1 TABLET BY MOUTH  DAILY, Disp: 90 tablet, Rfl: 0   Cyanocobalamin (B-12 PO), Take 1 tablet by mouth every morning. , Disp: , Rfl:    DULoxetine (CYMBALTA) 30 MG capsule, TAKE 1 CAPSULE BY MOUTH  DAILY, Disp: 90 capsule, Rfl: 0   DULoxetine (CYMBALTA) 60 MG capsule, TAKE 1 CAPSULE BY MOUTH  DAILY, Disp: 90 capsule, Rfl: 0   fluticasone (FLONASE) 50 MCG/ACT nasal spray, Place into both nostrils., Disp: , Rfl:    folic acid (FOLVITE) 1 MG tablet, Take 1 mg by mouth daily., Disp: , Rfl:    hydrochlorothiazide (HYDRODIURIL) 12.5 MG tablet, Take 1 tablet (12.5 mg total) by mouth daily., Disp: 90 tablet, Rfl: 1   lamoTRIgine (LAMICTAL) 150 MG tablet, TAKE 1 TABLET BY MOUTH IN  THE EVENING, Disp: 90 tablet, Rfl: 0   loratadine (CLARITIN) 10 MG tablet, Take 10 mg by mouth every evening. , Disp: , Rfl:    Multiple Vitamins-Minerals (MULTIVITAMIN WITH MINERALS) tablet, Take 1 tablet by mouth every evening. , Disp: , Rfl:    Probiotic Product (ALIGN PO), Take by mouth., Disp: , Rfl:    Cholecalciferol (VITAMIN D3) 50 MCG (2000 UT) TABS, Take 4,000 Units by mouth every morning., Disp: 60 tablet, Rfl: 11   Na Sulfate-K Sulfate-Mg Sulf 17.5-3.13-1.6  GM/177ML SOLN, Take 1 kit by mouth as directed. Suprep-take as directed for colonoscopy., Disp: 354 mL, Rfl: 0 Medication Side Effects: none  Family Medical/ Social History: Changes? Yes, she's back in the office full time now since Nov.   [place]:  There were no vitals taken for this visit.There is no height or weight on file to calculate BMI.  General Appearance: Casual, Neat and Well Groomed  Eye Contact:  Good  Speech:  Clear and Coherent and Normal Rate  Volume:  Normal  Mood:  Euthymic  Affect:  Appropriate  Thought Process:  Goal Directed and  Descriptions of Associations: Circumstantial  Orientation:  Full (Time, Place, and Person)  Thought Content: Logical   Suicidal Thoughts:  No  Homicidal Thoughts:  No  Memory:  WNL  Judgement:  Good  Insight:  Good  Psychomotor Activity:  Normal  Concentration:  Concentration: Good and Attention Span: Good  Recall:  Good  Fund of Knowledge: Good  Language: Good  Assets:  Desire for Improvement  ADL's:  Intact  Cognition: WNL  Prognosis:  Good   Labs  08/20/2020 CBC was normal. CMP sodium 128, chloride 90 otherwise normal values Hemoglobin A1c 5.4 Lipid profile total cholesterol was 212 but otherwise normal TSH 2.31 Vitamin D 36 Hepatitis C antibody nonreactive  09/04/20 Sodium 133, chloride 95, potassium 3.4, glucose 115 nonfasting, otherwise normal.  DIAGNOSES:    ICD-10-CM   1. Recurrent major depressive disorder, in full remission ([place])  F33.42     2. Encounter for long-term (current) use of medications  L89.211 Basic metabolic panel    3. Hyponatremia  H41.7 Basic metabolic panel    4. Generalized anxiety disorder  F41.1     5. Insomnia, unspecified type  G47.00        Receiving Psychotherapy: No    RECOMMENDATIONS:  PDMP was reviewed.  No results available. I provided 45 minutes of face to face time during this encounter, including time spent before and after the visit in records review, medical decision making, counseling concerning medications, and charting.  We discussed the fact that antidepressants can sometimes cause hyponatremia.  I am glad she has felt better and her sodium level went back to almost normal range after she added salt back into her diet.  However I do think it needs to be checked to make sure it has not dropped again.  I would like for her to have this done, not fasting, sometime within the next few days.  She has a screening colonoscopy scheduled for the middle of next week and I do not want that prep, dehydration, or what ever to skew  the results of the sodium.  She understands. Also discussed vitamin D.  Hers is normal, over 30 but still low normal.  For mental health reasons the vitamin D level should be around 50-60, as it helps with depression, seasonal affective disorder.  She is now taking 2000 IUs daily and she feels fine but I do recommend she increase that.  It is not really necessary to do a follow-up lab anytime in the near future since increasing the dose will still be below 5000 units daily. She has enough medications to last for at least 1 to 2 months so we agree for me to hold off on any refills until we know what her sodium level is.  If it is in the normal range of course we will continue medications as they are.  If not though I will  call her with instructions.  She knows to call our office within 2 business days after having drawn her labs if she has not heard from our office yet. Increase vitamin D 2000 IUs to 2 p.o. daily.  Continue Wellbutrin XL 300 mg, 1 p.o. every morning. Continue Cymbalta total of 90 mg daily. Continue Lamictal 150 mg daily. Continue multivitamin, vitamin D, B12, and biotin. Return in 6 months.  Donnal Moat, PA-C

## 2020-12-11 LAB — BASIC METABOLIC PANEL
BUN: 9 mg/dL (ref 7–25)
CO2: 29 mmol/L (ref 20–32)
Calcium: 9.2 mg/dL (ref 8.6–10.2)
Chloride: 95 mmol/L — ABNORMAL LOW (ref 98–110)
Creat: 0.72 mg/dL (ref 0.50–0.99)
Glucose, Bld: 86 mg/dL (ref 65–139)
Potassium: 3.7 mmol/L (ref 3.5–5.3)
Sodium: 132 mmol/L — ABNORMAL LOW (ref 135–146)

## 2020-12-11 NOTE — Progress Notes (Signed)
Dawn Scott please give her a call about the sodium.  normal is 135-146 and hers is 132.  It was 133 in May, so is stable.  I looked back in her chart and the last sodium before the one in April of this year was October 2020 and it was also 133.  This is not significant and she should continue all meds as directed.  I did discuss with Dr. Clovis Pu as I had told her I would and he agreed.

## 2020-12-12 ENCOUNTER — Encounter: Payer: Self-pay | Admitting: Gastroenterology

## 2020-12-12 ENCOUNTER — Other Ambulatory Visit: Payer: Self-pay

## 2020-12-12 ENCOUNTER — Ambulatory Visit (AMBULATORY_SURGERY_CENTER): Payer: 59 | Admitting: Gastroenterology

## 2020-12-12 VITALS — BP 133/73 | HR 76 | Temp 98.9°F | Resp 19 | Ht 69.0 in | Wt 193.0 lb

## 2020-12-12 DIAGNOSIS — Z538 Procedure and treatment not carried out for other reasons: Secondary | ICD-10-CM

## 2020-12-12 DIAGNOSIS — Z1211 Encounter for screening for malignant neoplasm of colon: Secondary | ICD-10-CM | POA: Diagnosis not present

## 2020-12-12 MED ORDER — SODIUM CHLORIDE 0.9 % IV SOLN: 500.0000 mL | Freq: Once | INTRAVENOUS | Status: DC

## 2020-12-12 NOTE — Op Note (Signed)
[facility] Patient Name: Dawn Scott Procedure Date: 12/12/2020 11:00 AM MRN: [id number] Endoscopist: Milus Banister , MD Age: 50 Referring MD:  Date of Birth: [birth year] Gender: Female Account #: [id number] Procedure:                Colonoscopy Indications:              Screening for colorectal malignant neoplasm Medicines:                Monitored Anesthesia Care Procedure:                Pre-Anesthesia Assessment:                           - Prior to the procedure, a History and Physical                            was performed, and patient medications and                            allergies were reviewed. The patient's tolerance of                            previous anesthesia was also reviewed. The risks                            and benefits of the procedure and the sedation                            options and risks were discussed with the patient.                            All questions were answered, and informed consent                            was obtained. Prior Anticoagulants: The patient has                            taken no previous anticoagulant or antiplatelet                            agents. ASA Grade Assessment: II - A patient with                            mild systemic disease. After reviewing the risks                            and benefits, the patient was deemed in                            satisfactory condition to undergo the procedure.                           After obtaining informed consent, the colonoscope  was passed under direct vision. Throughout the                            procedure, the patient's blood pressure, pulse, and                            oxygen saturations were monitored continuously. The                            CF [id number] was introduced through the anus                            with the intention of advancing to the cecum. The                            scope was  advanced to the splenic flexure before                            the procedure was aborted. Medications were given.                            The colonoscopy was performed without difficulty.                            The patient tolerated the procedure well. The                            quality of the bowel preparation was poor. Scope In: Scope Out: Findings:                 A large amount of stool was found throughout the                            left colon, precluding adequate visualization.                           The was an incomplete examination. Complications:            No immediate complications. Estimated blood loss:                            None. Estimated Blood Loss:     Estimated blood loss: none. Impression:               - Preparation of the colon was poor and so this was                            an incomplete examination. Recommendation:           - Patient has a contact number available for                            emergencies. The signs and symptoms of potential  delayed complications were discussed with the                            patient. Return to normal activities tomorrow.                            Written discharge instructions were provided to the                            patient.                           - Resume previous diet.                           - Continue present medications.                           - Repeat colonoscopy at the next available                            appointment because the bowel preparation was poor                            (double prep protocol). Milus Banister, MD 12/12/2020 11:14:44 AM This report has been signed electronically.

## 2020-12-12 NOTE — Patient Instructions (Addendum)
Resume previous diet and medications.  Repeat colonoscopy at next available appointment. Patient to call and schedule after checking calendar and insurance.   YOU HAD AN ENDOSCOPIC PROCEDURE TODAY AT [facility]:   Refer to the procedure report that was given to you for any specific questions about what was found during the examination.  If the procedure report does not answer your questions, please call your gastroenterologist to clarify.  If you requested that your care partner not be given the details of your procedure findings, then the procedure report has been included in a sealed envelope for you to review at your convenience later.  YOU SHOULD EXPECT: Some feelings of bloating in the abdomen. Passage of more gas than usual.  Walking can help get rid of the air that was put into your GI tract during the procedure and reduce the bloating. If you had a lower endoscopy (such as a colonoscopy or flexible sigmoidoscopy) you may notice spotting of blood in your stool or on the toilet paper. If you underwent a bowel prep for your procedure, you may not have a normal bowel movement for a few days.  Please Note:  You might notice some irritation and congestion in your nose or some drainage.  This is from the oxygen used during your procedure.  There is no need for concern and it should clear up in a day or so.  SYMPTOMS TO REPORT IMMEDIATELY:  Following lower endoscopy (colonoscopy or flexible sigmoidoscopy):  Excessive amounts of blood in the stool  Significant tenderness or worsening of abdominal pains  Swelling of the abdomen that is new, acute  Fever of 100F or higher   For urgent or emergent issues, a gastroenterologist can be reached at any hour by calling [phone]. Do not use MyChart messaging for urgent concerns.    DIET:  We do recommend a small meal at first, but then you may proceed to your regular diet.  Drink plenty of fluids but you should avoid alcoholic  beverages for 24 hours.  ACTIVITY:  You should plan to take it easy for the rest of today and you should NOT DRIVE or use heavy machinery until tomorrow (because of the sedation medicines used during the test).    FOLLOW UP: Our staff will call the number listed on your records 48-72 hours following your procedure to check on you and address any questions or concerns that you may have regarding the information given to you following your procedure. If we do not reach you, we will leave a message.  We will attempt to reach you two times.  During this call, we will ask if you have developed any symptoms of COVID 19. If you develop any symptoms (ie: fever, flu-like symptoms, shortness of breath, cough etc.) before then, please call [phone].  If you test positive for Covid 19 in the 2 weeks post procedure, please call and report this information to [place].    If any biopsies were taken you will be contacted by phone or by letter within the next 1-3 weeks.  Please call us at [phone] if you have not heard about the biopsies in 3 weeks.    SIGNATURES/CONFIDENTIALITY: You and/or your care partner have signed paperwork which will be entered into your electronic medical record.  These signatures attest to the fact that that the information above on your After Visit Summary has been reviewed and is understood.  Full responsibility of the confidentiality of this discharge information lies  with you and/or your care-partner.  

## 2020-12-12 NOTE — Progress Notes (Signed)
Patient is to return at next available colon appt due to poor prep today.  Patient needs to check calendar at home and check with insurance.  She will call to schedule appointment.

## 2020-12-12 NOTE — Progress Notes (Signed)
pt tolerated well. VSS. awake and to recovery. Report given to RN.  

## 2020-12-12 NOTE — Progress Notes (Signed)
CHECK-IN-JOEY  V/S-CW

## 2020-12-12 NOTE — Progress Notes (Signed)
HPI: This is a woman at routine risk for CRC   ROS: complete GI ROS as described in HPI, all other review negative.  Constitutional:  No unintentional weight loss   Past Medical History:  Diagnosis Date   Allergy    Anxiety    Bloody stool 07/07/2016   Clostridioides difficile infection    Depression    History of ectopic pregnancy    04/ 2002  S/P  LEFT SALPINGECTOMY   History of endometriosis    History of ovarian cyst    Hypertension    Left ovarian cyst    Menorrhagia with regular cycle    PONV (postoperative nausea and vomiting)    Shingles    Uterine fibroid     Past Surgical History:  Procedure Laterality Date   APPENDECTOMY     DILATATION & CURETTAGE/HYSTEROSCOPY WITH MYOSURE N/A 10/30/2016   Procedure: , REMOVAL IUD, HYSTEROSCOPY, DILATION AND CURETTAGE;  Surgeon: Princess Bruins, MD;  Location: [facility];  Service: Gynecology;  Laterality: N/A;   DILATION AND EVACUATION  03/10/2003   dr Dellis Filbert   retained placental tissue postpartum   DX LAPAROSCOPY/ LYSIS ADHESIONAS/  FULGERATION ENDOMETRIOSIS/  LEFT SALPINGECTOMY WITH REMOVAL ECOTPIC  08/18/2000   dr Ronita Hipps   HYSTEROSCOPY WITH NOVASURE N/A 10/30/2016   Procedure: HYSTEROSCOPY WITH NOVASURE;  Surgeon: Princess Bruins, MD;  Location: [facility];  Service: Gynecology;  Laterality: N/A;   LAPAROSCOPIC APPENDECTOMY  11/30/2007   SEPTOPLASTY  2011   TONSILLECTOMY AND ADENOIDECTOMY  child   WISDOM TOOTH EXTRACTION  1996    Current Outpatient Medications  Medication Sig Dispense Refill   Biotin 1 MG CAPS Take by mouth.     BLACK COHOSH EXTRACT PO Take by mouth.     buPROPion (WELLBUTRIN XL) 300 MG 24 hr tablet TAKE 1 TABLET BY MOUTH  DAILY 90 tablet 0   Cholecalciferol (VITAMIN D3) 50 MCG (2000 UT) TABS Take 4,000 Units by mouth every morning. 60 tablet 11   Cyanocobalamin (B-12 PO) Take 1 tablet by mouth every morning.      DULoxetine (CYMBALTA) 30 MG capsule TAKE 1 CAPSULE BY  MOUTH  DAILY 90 capsule 0   DULoxetine (CYMBALTA) 60 MG capsule TAKE 1 CAPSULE BY MOUTH  DAILY 90 capsule 0   fluticasone (FLONASE) 50 MCG/ACT nasal spray Place into both nostrils.     folic acid (FOLVITE) 1 MG tablet Take 1 mg by mouth daily.     hydrochlorothiazide (HYDRODIURIL) 12.5 MG tablet Take 1 tablet (12.5 mg total) by mouth daily. 90 tablet 1   lamoTRIgine (LAMICTAL) 150 MG tablet TAKE 1 TABLET BY MOUTH IN  THE EVENING 90 tablet 0   loratadine (CLARITIN) 10 MG tablet Take 10 mg by mouth every evening.      Multiple Vitamins-Minerals (MULTIVITAMIN WITH MINERALS) tablet Take 1 tablet by mouth every evening.      Probiotic Product (ALIGN PO) Take by mouth.     Na Sulfate-K Sulfate-Mg Sulf 17.5-3.13-1.6 GM/177ML SOLN Take 1 kit by mouth as directed. Suprep-take as directed for colonoscopy. 354 mL 0   Current Facility-Administered Medications  Medication Dose Route Frequency Provider Last Rate Last Admin   0.9 %  sodium chloride infusion  500 mL Intravenous Once Milus Banister, MD        Allergies as of 12/12/2020 - Review Complete 12/12/2020  Allergen Reaction Noted   Augmentin [amoxicillin-pot clavulanate] Other (See Comments) 08/03/2018    Family History  Problem Relation Age of  Onset   Depression Mother    Arthritis Mother    Heart disease Mother    Transient ischemic attack Mother    Alcohol abuse Father    Depression Father    Diabetes Father    Alcohol abuse Maternal Grandfather    Leukemia Maternal Grandfather    Early death Maternal Grandfather        leukemia   Breast cancer Maternal Aunt 75   Early death Maternal Aunt        cancer   Depression Paternal Uncle    Transient ischemic attack Maternal Grandmother    Arthritis Paternal Grandmother    Dementia Paternal Grandmother    Alcohol abuse Paternal Grandfather    Early death Paternal Grandfather    Colon cancer Neg Hx    Colon polyps Neg Hx    Esophageal cancer Neg Hx    Rectal cancer Neg Hx     Stomach cancer Neg Hx     Social History   Socioeconomic History   Marital status: Widowed    Spouse name: Not on file   Number of children: Not on file   Years of education: Not on file   Highest education level: Not on file  Occupational History   Not on file  Tobacco Use   Smoking status: Never   Smokeless tobacco: Never  Vaping Use   Vaping Use: Never used  Substance and Sexual Activity   Alcohol use: Not Currently   Drug use: No   Sexual activity: Not Currently    Birth control/protection: Abstinence  Other Topics Concern   Not on file  Social History Narrative   Widower. Husband died on the job 11-Jul-2011.   Bachelor of fine arts, currently not working    One child, Designer, jewellery.   No tobacco or drug use, rare use of alcohol.   Drinks caffeinated beverages, takes a daily vitamin   Wears her seatbelt, wears a bicycle, smoke detectors in the home   exercise at least 3 times a week.   Feels safe in her relationships.   Social Determinants of Health   Financial Resource Strain: Not on file  Food Insecurity: Not on file  Transportation Needs: Not on file  Physical Activity: Not on file  Stress: Not on file  Social Connections: Not on file  Intimate Partner Violence: Not on file     Physical Exam: BP (!) 143/85 (Patient Position: Sitting)   Pulse 87   Temp 98.9 F (37.2 C)   Ht '5\' 9"'  (1.753 m)   Wt 193 lb (87.5 kg)   LMP 09/24/2020 (Approximate)   SpO2 98%   BMI 28.50 kg/m  Constitutional: generally well-appearing Psychiatric: alert and oriented x3 Lungs: CTA bilaterally Heart: no MCR  Assessment and plan: 50 y.o. female with routine risk for CRC  Colonoscopy today  Care is appropriate for the ambulatory setting.  Owens Loffler, MD [facility] 12/12/2020, 10:50 AM

## 2020-12-13 NOTE — Progress Notes (Signed)
Noted  

## 2020-12-13 NOTE — Progress Notes (Signed)
LVM with info and to return call with any questions

## 2020-12-14 ENCOUNTER — Telehealth: Payer: Self-pay | Admitting: *Deleted

## 2020-12-14 NOTE — Telephone Encounter (Signed)
  Follow up Call-  Call back number 12/12/2020  Post procedure Call Back phone  # [phone]  Permission to leave phone message Yes  Some recent data might be hidden     Patient questions:  Do you have a fever, pain , or abdominal swelling? No. Pain Score  0 *  Have you tolerated food without any problems? Yes.    Have you been able to return to your normal activities? Yes.    Do you have any questions about your discharge instructions: Diet   No. Medications  No. Follow up visit  No.  Do you have questions or concerns about your Care? No.  Actions: * If pain score is 4 or above: No action needed, pain <4.Have you developed a fever since your procedure? no  2.   Have you had an respiratory symptoms (SOB or cough) since your procedure? no  3.   Have you tested positive for COVID 19 since your procedure no  4.   Have you had any family members/close contacts diagnosed with the COVID 19 since your procedure?  no   If yes to any of these questions please route to Joylene John, RN and Joella Prince, RN

## 2020-12-18 ENCOUNTER — Other Ambulatory Visit: Payer: Self-pay | Admitting: Physician Assistant

## 2021-01-11 ENCOUNTER — Other Ambulatory Visit: Payer: Self-pay | Admitting: Physician Assistant

## 2021-01-18 ENCOUNTER — Other Ambulatory Visit: Payer: Self-pay

## 2021-01-18 ENCOUNTER — Ambulatory Visit (AMBULATORY_SURGERY_CENTER): Payer: 59

## 2021-01-18 VITALS — Ht 68.5 in | Wt 195.0 lb

## 2021-01-18 DIAGNOSIS — Z1211 Encounter for screening for malignant neoplasm of colon: Secondary | ICD-10-CM

## 2021-01-18 NOTE — Progress Notes (Signed)

## 2021-01-25 ENCOUNTER — Encounter: Payer: Self-pay | Admitting: Gastroenterology

## 2021-01-30 ENCOUNTER — Other Ambulatory Visit: Payer: Self-pay | Admitting: Family Medicine

## 2021-02-03 ENCOUNTER — Other Ambulatory Visit: Payer: Self-pay | Admitting: Physician Assistant

## 2021-02-04 ENCOUNTER — Other Ambulatory Visit: Payer: Self-pay

## 2021-02-04 ENCOUNTER — Ambulatory Visit (AMBULATORY_SURGERY_CENTER): Payer: 59 | Admitting: Gastroenterology

## 2021-02-04 ENCOUNTER — Encounter: Payer: Self-pay | Admitting: Gastroenterology

## 2021-02-04 VITALS — BP 140/90 | HR 76 | Temp 89.0°F | Resp 14 | Ht 68.5 in | Wt 195.0 lb

## 2021-02-04 DIAGNOSIS — Z1211 Encounter for screening for malignant neoplasm of colon: Secondary | ICD-10-CM

## 2021-02-04 MED ORDER — SODIUM CHLORIDE 0.9 % IV SOLN: 500.0000 mL | Freq: Once | INTRAVENOUS | Status: DC

## 2021-02-04 NOTE — Progress Notes (Signed)
To PACU, VSS. Report to [email or web address] 

## 2021-02-04 NOTE — Op Note (Signed)
[facility] Patient Name: Bryn Saline Procedure Date: 02/04/2021 3:52 PM MRN: [id number] Endoscopist: Milus Banister , MD Age: 50 Referring MD:  Date of Birth: 11-25-1970 Gender: Female Account #: [id number] Procedure:                Colonoscopy Indications:              Screening for colorectal malignant neoplasm Medicines:                Monitored Anesthesia Care Procedure:                Pre-Anesthesia Assessment:                           - Prior to the procedure, a History and Physical                            was performed, and patient medications and                            allergies were reviewed. The patient's tolerance of                            previous anesthesia was also reviewed. The risks                            and benefits of the procedure and the sedation                            options and risks were discussed with the patient.                            All questions were answered, and informed consent                            was obtained. Prior Anticoagulants: The patient has                            taken no previous anticoagulant or antiplatelet                            agents. ASA Grade Assessment: II - A patient with                            mild systemic disease. After reviewing the risks                            and benefits, the patient was deemed in                            satisfactory condition to undergo the procedure.                           After obtaining informed consent, the colonoscope  was passed under direct vision. Throughout the                            procedure, the patient's blood pressure, pulse, and                            oxygen saturations were monitored continuously. The                            CF [id number] #[id number] was introduced through the anus                            and advanced to the the cecum, identified by                            appendiceal orifice  and ileocecal valve. The                            colonoscopy was performed without difficulty. The                            patient tolerated the procedure well. The quality                            of the bowel preparation was good. The ileocecal                            valve, appendiceal orifice, and rectum were                            photographed. Scope In: 3:59:11 PM Scope Out: 4:15:11 PM Scope Withdrawal Time: 0 hours 7 minutes 7 seconds  Total Procedure Duration: 0 hours 16 minutes 0 seconds  Findings:                 The entire examined colon appeared normal on direct                            and retroflexion views. Complications:            No immediate complications. Estimated blood loss:                            None. Estimated Blood Loss:     Estimated blood loss: none. Impression:               - The entire examined colon is normal on direct and                            retroflexion views.                           - No polyps or cancers. Recommendation:           - Patient has a contact number available for  emergencies. The signs and symptoms of potential                            delayed complications were discussed with the                            patient. Return to normal activities tomorrow.                            Written discharge instructions were provided to the                            patient.                           - Resume previous diet.                           - Continue present medications.                           - Repeat colonoscopy in 10 years for screening. Milus Banister, MD 02/04/2021 4:21:10 PM This report has been signed electronically.

## 2021-02-04 NOTE — Progress Notes (Signed)
Pt's states no medical or surgical changes since previsit or office visit. 

## 2021-02-04 NOTE — Progress Notes (Signed)
HPI: This is a woman with routine risk for CRC  ROS: complete GI ROS as described in HPI, all other review negative.  Constitutional:  No unintentional weight loss   Past Medical History:  Diagnosis Date   Allergy    Anxiety    Bloody stool 07/07/2016   Clostridioides difficile infection    Depression    History of ectopic pregnancy    04/ 2002  S/P  LEFT SALPINGECTOMY   History of endometriosis    History of ovarian cyst    Hypertension    Left ovarian cyst    Menorrhagia with regular cycle    PONV (postoperative nausea and vomiting)    Shingles    Uterine fibroid     Past Surgical History:  Procedure Laterality Date   APPENDECTOMY     DILATATION & CURETTAGE/HYSTEROSCOPY WITH MYOSURE N/A 10/30/2016   Procedure: , REMOVAL IUD, HYSTEROSCOPY, DILATION AND CURETTAGE;  Surgeon: Princess Bruins, MD;  Location: [facility];  Service: Gynecology;  Laterality: N/A;   DILATION AND EVACUATION  03/10/2003   dr Dellis Filbert   retained placental tissue postpartum   DX LAPAROSCOPY/ LYSIS ADHESIONAS/  FULGERATION ENDOMETRIOSIS/  LEFT SALPINGECTOMY WITH REMOVAL ECOTPIC  08/18/2000   dr Ronita Hipps   HYSTEROSCOPY WITH NOVASURE N/A 10/30/2016   Procedure: HYSTEROSCOPY WITH NOVASURE;  Surgeon: Princess Bruins, MD;  Location: [facility];  Service: Gynecology;  Laterality: N/A;   LAPAROSCOPIC APPENDECTOMY  11/30/2007   SEPTOPLASTY  2011   TONSILLECTOMY AND ADENOIDECTOMY  child   WISDOM TOOTH EXTRACTION  1996    Current Outpatient Medications  Medication Sig Dispense Refill   Biotin 1 MG CAPS Take by mouth.     buPROPion (WELLBUTRIN XL) 300 MG 24 hr tablet TAKE 1 TABLET BY MOUTH  DAILY 90 tablet 0   Cholecalciferol (VITAMIN D3) 50 MCG (2000 UT) TABS Take 4,000 Units by mouth every morning. 60 tablet 11   Cyanocobalamin (B-12 PO) Take 1 tablet by mouth every morning.      DULoxetine (CYMBALTA) 60 MG capsule TAKE 1 CAPSULE BY MOUTH  DAILY 90 capsule 3   fluticasone  (FLONASE) 50 MCG/ACT nasal spray Place into both nostrils.     folic acid (FOLVITE) 1 MG tablet Take 1 mg by mouth daily.     hydrochlorothiazide (HYDRODIURIL) 12.5 MG tablet Take 1 tablet (12.5 mg total) by mouth daily. 90 tablet 1   lamoTRIgine (LAMICTAL) 150 MG tablet TAKE 1 TABLET BY MOUTH IN  THE EVENING 90 tablet 3   loratadine (CLARITIN) 10 MG tablet Take 10 mg by mouth every evening.      Multiple Vitamins-Minerals (MULTIVITAMIN WITH MINERALS) tablet Take 1 tablet by mouth every evening.      Probiotic Product (ALIGN PO) Take by mouth.     BLACK COHOSH EXTRACT PO Take by mouth.     DULoxetine (CYMBALTA) 30 MG capsule TAKE 1 CAPSULE BY MOUTH  DAILY 90 capsule 3   Current Facility-Administered Medications  Medication Dose Route Frequency Provider Last Rate Last Admin   0.9 %  sodium chloride infusion  500 mL Intravenous Once Milus Banister, MD       0.9 %  sodium chloride infusion  500 mL Intravenous Once Milus Banister, MD        Allergies as of 02/04/2021 - Review Complete 02/04/2021  Allergen Reaction Noted   Augmentin [amoxicillin-pot clavulanate] Other (See Comments) 08/03/2018    Family History  Problem Relation Age of Onset   Depression  Mother    Arthritis Mother    Heart disease Mother    Transient ischemic attack Mother    Alcohol abuse Father    Depression Father    Diabetes Father    Alcohol abuse Maternal Grandfather    Leukemia Maternal Grandfather    Early death Maternal Grandfather        leukemia   Breast cancer Maternal Aunt 26   Early death Maternal Aunt        cancer   Depression Paternal Uncle    Transient ischemic attack Maternal Grandmother    Arthritis Paternal Grandmother    Dementia Paternal Grandmother    Alcohol abuse Paternal Grandfather    Early death Paternal Grandfather    Colon cancer Neg Hx    Colon polyps Neg Hx    Esophageal cancer Neg Hx    Rectal cancer Neg Hx    Stomach cancer Neg Hx     Social History   Socioeconomic  History   Marital status: Widowed    Spouse name: Not on file   Number of children: Not on file   Years of education: Not on file   Highest education level: Not on file  Occupational History   Not on file  Tobacco Use   Smoking status: Never   Smokeless tobacco: Never  Vaping Use   Vaping Use: Never used  Substance and Sexual Activity   Alcohol use: Not Currently   Drug use: No   Sexual activity: Not Currently    Birth control/protection: Abstinence  Other Topics Concern   Not on file  Social History Narrative   Widower. Husband died on the job 07/17/11.   Bachelor of fine arts, currently not working    One child, Designer, jewellery.   No tobacco or drug use, rare use of alcohol.   Drinks caffeinated beverages, takes a daily vitamin   Wears her seatbelt, wears a bicycle, smoke detectors in the home   exercise at least 3 times a week.   Feels safe in her relationships.   Social Determinants of Health   Financial Resource Strain: Not on file  Food Insecurity: Not on file  Transportation Needs: Not on file  Physical Activity: Not on file  Stress: Not on file  Social Connections: Not on file  Intimate Partner Violence: Not on file     Physical Exam: BP 135/78   Temp (!) 89 F (31.7 C) (Temporal)   Ht 5' 8.5" (1.74 m)   Wt 195 lb (88.5 kg)   SpO2 98%   BMI 29.22 kg/m  Constitutional: generally well-appearing Psychiatric: alert and oriented x3 Lungs: CTA bilaterally Heart: no MCR  Assessment and plan: 50 y.o. female with routine risk for CTC  Colonoscopy today  Care is appropriate for the ambulatory setting.  Owens Loffler, MD [facility] 02/04/2021, 3:48 PM

## 2021-02-04 NOTE — Patient Instructions (Signed)
Read all of the handouts given to you by your recovery room nurse.  YOU HAD AN ENDOSCOPIC PROCEDURE TODAY AT [facility]:   Refer to the procedure report that was given to you for any specific questions about what was found during the examination.  If the procedure report does not answer your questions, please call your gastroenterologist to clarify.  If you requested that your care partner not be given the details of your procedure findings, then the procedure report has been included in a sealed envelope for you to review at your convenience later.  YOU SHOULD EXPECT: Some feelings of bloating in the abdomen. Passage of more gas than usual.  Walking can help get rid of the air that was put into your GI tract during the procedure and reduce the bloating. If you had a lower endoscopy (such as a colonoscopy or flexible sigmoidoscopy) you may notice spotting of blood in your stool or on the toilet paper. If you underwent a bowel prep for your procedure, you may not have a normal bowel movement for a few days.  Please Note:  You might notice some irritation and congestion in your nose or some drainage.  This is from the oxygen used during your procedure.  There is no need for concern and it should clear up in a day or so.  SYMPTOMS TO REPORT IMMEDIATELY:  Following lower endoscopy (colonoscopy or flexible sigmoidoscopy):  Excessive amounts of blood in the stool  Significant tenderness or worsening of abdominal pains  Swelling of the abdomen that is new, acute  Fever of 100F or higher    For urgent or emergent issues, a gastroenterologist can be reached at any hour by calling [phone]. Do not use MyChart messaging for urgent concerns.    DIET:  We do recommend a small meal at first, but then you may proceed to your regular diet.  Drink plenty of fluids but you should avoid alcoholic beverages for 24 hours.  ACTIVITY:  You should plan to take it easy for the rest of today  and you should NOT DRIVE or use heavy machinery until tomorrow (because of the sedation medicines used during the test).    FOLLOW UP: Our staff will call the number listed on your records 48-72 hours following your procedure to check on you and address any questions or concerns that you may have regarding the information given to you following your procedure. If we do not reach you, we will leave a message.  We will attempt to reach you two times.  During this call, we will ask if you have developed any symptoms of COVID 19. If you develop any symptoms (ie: fever, flu-like symptoms, shortness of breath, cough etc.) before then, please call [phone].  If you test positive for Covid 19 in the 2 weeks post procedure, please call and report this information to [place].     SIGNATURES/CONFIDENTIALITY: You and/or your care partner have signed paperwork which will be entered into your electronic medical record.  These signatures attest to the fact that that the information above on your After Visit Summary has been reviewed and is understood.  Full responsibility of the confidentiality of this discharge information lies with you and/or your care-partner.

## 2021-02-06 ENCOUNTER — Telehealth: Payer: Self-pay | Admitting: *Deleted

## 2021-02-06 NOTE — Telephone Encounter (Signed)
  Follow up Call-  Call back number 02/04/2021 12/12/2020  Post procedure Call Back phone  # [phone]  Permission to leave phone message Yes Yes  Some recent data might be hidden     Patient questions:  Do you have a fever, pain , or abdominal swelling? No. Pain Score  0 *  Have you tolerated food without any problems? Yes.    Have you been able to return to your normal activities? Yes.    Do you have any questions about your discharge instructions: Diet   No. Medications  No. Follow up visit  No.  Do you have questions or concerns about your Care? No.  Actions: * If pain score is 4 or above: No action needed, pain <4.  Have you developed a fever since your procedure? no  2.   Have you had an respiratory symptoms (SOB or cough) since your procedure? no  3.   Have you tested positive for COVID 19 since your procedure no  4.   Have you had any family members/close contacts diagnosed with the COVID 19 since your procedure?  no   If yes to any of these questions please route to Joylene John, RN and Joella Prince, RN

## 2021-02-19 ENCOUNTER — Ambulatory Visit: Payer: 59 | Admitting: Family Medicine

## 2021-02-20 ENCOUNTER — Other Ambulatory Visit: Payer: Self-pay

## 2021-02-20 ENCOUNTER — Ambulatory Visit (INDEPENDENT_AMBULATORY_CARE_PROVIDER_SITE_OTHER): Payer: 59 | Admitting: Family Medicine

## 2021-02-20 ENCOUNTER — Encounter: Payer: Self-pay | Admitting: Family Medicine

## 2021-02-20 VITALS — BP 130/87 | HR 86 | Temp 98.8°F | Ht 69.0 in | Wt 203.0 lb

## 2021-02-20 DIAGNOSIS — Z8279 Family history of other congenital malformations, deformations and chromosomal abnormalities: Secondary | ICD-10-CM | POA: Diagnosis not present

## 2021-02-20 DIAGNOSIS — I10 Essential (primary) hypertension: Secondary | ICD-10-CM | POA: Diagnosis not present

## 2021-02-20 DIAGNOSIS — E663 Overweight: Secondary | ICD-10-CM | POA: Diagnosis not present

## 2021-02-20 DIAGNOSIS — E559 Vitamin D deficiency, unspecified: Secondary | ICD-10-CM | POA: Diagnosis not present

## 2021-02-20 MED ORDER — HYDROCHLOROTHIAZIDE 12.5 MG PO TABS: 12.5000 mg | ORAL_TABLET | Freq: Every day | ORAL | 1 refills | Status: DC

## 2021-02-20 NOTE — Patient Instructions (Signed)
  Great to meet you today.  I have refilled the medication(s) we provide.   If labs were collected, we will inform you of lab results once received either by echart message or telephone call.   - echart message- for normal results that have been seen by the patient already.   - telephone call: abnormal results or if patient has not viewed results in their echart.

## 2021-02-20 NOTE — Progress Notes (Signed)
This visit occurred during the SARS-CoV-2 public health emergency.  Safety protocols were in place, including screening questions prior to the visit, additional usage of staff PPE, and extensive cleaning of exam room while observing appropriate contact time as indicated for disinfecting solutions.    Patient ID: Dawn Scott, female  DOB: 01-01-1971, 50 y.o.   MRN: [id number] Patient Care Team    Relationship Specialty Notifications Start End  Ma Hillock, DO PCP - General Family Medicine  06/28/15   Princess Bruins, MD Consulting Physician Obstetrics and Gynecology  06/28/15   Purnell Shoemaker., MD Attending Physician Psychiatry  06/28/15     Chief Complaint  Patient presents with   Hypertension    [place];     Subjective: Dawn Scott is a 50 y.o.  Female  present for [facility] All past medical history, surgical history, allergies, family history, immunizations, medications and social history were updated in the electronic medical record today. All recent labs, ED visits and hospitalizations within the last year were reviewed.  HTN/obesity/Fhx HD-mother: Pt reports compliance with HCTZ 12.5 mg QD. Blood pressures ranges at home normal. Patient denies chest pain, shortness of breath, dizziness or lower extremity edema.  RF: htn, obesity, FHX HD mother.   Depression screen [facility] 2/9 09/04/2020 08/20/2020 02/23/2019 06/28/2015  Decreased Interest 0 1 3 1   Down, Depressed, Hopeless 0 1 0 0  PHQ - 2 Score 0 2 3 1   Altered sleeping - 1 0 -  Tired, decreased energy - 3 0 -  Change in appetite - 0 0 -  Feeling bad or failure about yourself  - 0 0 -  Trouble concentrating - 2 0 -  Moving slowly or fidgety/restless - 0 0 -  Suicidal thoughts - 0 0 -  PHQ-9 Score - 8 3 -  Difficult doing work/chores - - Not difficult at all -   GAD 7 : Generalized Anxiety Score 08/20/2020 06/28/2015  Nervous, Anxious, on Edge 0 0  Control/stop worrying 0 0  Worry too much - different things 0 0  Trouble  relaxing 0 0  Restless 0 -  Easily annoyed or irritable 1 0  Afraid - awful might happen 0 0  Total GAD 7 Score 1 -    Immunization History  Administered Date(s) Administered   Influenza,inj,Quad PF,6+ Mos 02/04/2013, 01/18/2014, 01/22/2015   Influenza-Unspecified 02/11/2021   PFIZER(Purple Top)SARS-COV-2 Vaccination 07/14/2019, 08/08/2019, 05/04/2020   Pfizer Covid-19 Vaccine Bivalent Booster 10yrs & up 02/01/2021   Tdap 11/24/2014    Past Medical History:  Diagnosis Date   Allergy    Anxiety    Bloody stool 07/07/2016   Clostridioides difficile infection    Depression    History of ectopic pregnancy    04/ 2002  S/P  LEFT SALPINGECTOMY   History of endometriosis    History of ovarian cyst    Hypertension    Left ovarian cyst    Menorrhagia with regular cycle    PONV (postoperative nausea and vomiting)    Shingles    Uterine fibroid    Allergies  Allergen Reactions   Augmentin [Amoxicillin-Pot Clavulanate] Other (See Comments)    Diarrhea C.diff positive.   Past Surgical History:  Procedure Laterality Date   APPENDECTOMY     DILATATION & CURETTAGE/HYSTEROSCOPY WITH MYOSURE N/A 10/30/2016   Procedure: , REMOVAL IUD, HYSTEROSCOPY, DILATION AND CURETTAGE;  Surgeon: Princess Bruins, MD;  Location: [place];  Service: Gynecology;  Laterality: N/A;   DILATION  AND EVACUATION  03/10/2003   dr Dellis Filbert   retained placental tissue postpartum   DX LAPAROSCOPY/ LYSIS ADHESIONAS/  FULGERATION ENDOMETRIOSIS/  LEFT SALPINGECTOMY WITH REMOVAL ECOTPIC  08/18/2000   dr Ronita Hipps   HYSTEROSCOPY WITH NOVASURE N/A 10/30/2016   Procedure: HYSTEROSCOPY WITH NOVASURE;  Surgeon: Princess Bruins, MD;  Location: [place];  Service: Gynecology;  Laterality: N/A;   LAPAROSCOPIC APPENDECTOMY  11/30/2007   SEPTOPLASTY  2009/08/05   TONSILLECTOMY AND ADENOIDECTOMY  child   WISDOM TOOTH EXTRACTION  1996   Family History  Problem Relation Age of Onset   Depression  Mother    Arthritis Mother    Heart disease Mother    Transient ischemic attack Mother    Alcohol abuse Father    Depression Father    Diabetes Father    Alcohol abuse Maternal Grandfather    Leukemia Maternal Grandfather    Early death Maternal Grandfather        leukemia   Breast cancer Maternal Aunt 36   Early death Maternal Aunt        cancer   Depression Paternal Uncle    Transient ischemic attack Maternal Grandmother    Arthritis Paternal Grandmother    Dementia Paternal Grandmother    Alcohol abuse Paternal Grandfather    Early death Paternal Grandfather    Colon cancer Neg Hx    Colon polyps Neg Hx    Esophageal cancer Neg Hx    Rectal cancer Neg Hx    Stomach cancer Neg Hx    Social History   Social History Narrative   Widower. Husband died on the job 08-06-11.   Bachelor of fine arts, currently not working    One child, Designer, jewellery.   No tobacco or drug use, rare use of alcohol.   Drinks caffeinated beverages, takes a daily vitamin   Wears her seatbelt, wears a bicycle, smoke detectors in the home   exercise at least 3 times a week.   Feels safe in her relationships.    Allergies as of 02/20/2021       Reactions   Augmentin [amoxicillin-pot Clavulanate] Other (See Comments)   Diarrhea C.diff positive.        Medication List        Accurate as of February 20, 2021  5:20 PM. If you have any questions, ask your nurse or doctor.          STOP taking these medications    BLACK COHOSH EXTRACT PO Stopped by: Howard Pouch, DO       TAKE these medications    ALIGN PO Take by mouth.   B-12 PO Take 1 tablet by mouth every morning.   Biotin 1 MG Caps Take by mouth.   buPROPion 300 MG 24 hr tablet Commonly known as: WELLBUTRIN XL TAKE 1 TABLET BY MOUTH  DAILY   DULoxetine 60 MG capsule Commonly known as: CYMBALTA TAKE 1 CAPSULE BY MOUTH  DAILY   DULoxetine 30 MG capsule Commonly known as: CYMBALTA TAKE 1 CAPSULE BY MOUTH  DAILY   fluticasone  50 MCG/ACT nasal spray Commonly known as: FLONASE Place into both nostrils.   folic acid 1 MG tablet Commonly known as: FOLVITE Take 1 mg by mouth daily.   hydrochlorothiazide 12.5 MG tablet Commonly known as: HYDRODIURIL Take 1 tablet (12.5 mg total) by mouth daily.   lamoTRIgine 150 MG tablet Commonly known as: LAMICTAL TAKE 1 TABLET BY MOUTH IN  THE EVENING   loratadine 10 MG tablet  Commonly known as: CLARITIN Take 10 mg by mouth every evening.   multivitamin with minerals tablet Take 1 tablet by mouth every evening.   Vitamin D3 50 MCG (2000 UT) Tabs Take 4,000 Units by mouth every morning.        All past medical history, surgical history, allergies, family history, immunizations andmedications were updated in the EMR today and reviewed under the history and medication portions of their EMR.      No results found.   ROS: 14 pt review of systems performed and negative (unless mentioned in an HPI)  Objective: BP 130/87   Pulse 86   Temp 98.8 F (37.1 C) (Oral)   Ht 5\' 9"  (1.753 m)   Wt 203 lb (92.1 kg)   SpO2 97%   BMI 29.98 kg/m  Gen: Afebrile. No acute distress.  Nontoxic, very pleasant female HENT: AT. [place].  No cough.  No hoarseness. Eyes:Pupils Equal Round Reactive to light, Extraocular movements intact,  Conjunctiva without redness, discharge or icterus. Neck/lymp/endocrine: Supple, no lymphadenopathy, no thyromegaly CV: RRR no murmur, no edema, +2/4 P posterior tibialis pulses Chest: CTAB, no wheeze or crackles  Neuro: Normal gait. PERLA. EOMi. Alert. Oriented x3 Psych: Normal affect, dress and demeanor. Normal speech. Normal thought content and judgment..    No results found.  Assessment/plan: GLENDER AUGUSTA is a 50 y.o. female present for  HTN/obesity/Fhx HD-mother: Stable, but diastolic higher than her normal today.  She will keep track of this for over the next few weeks and let us know if he continuously stays above 80 diastolic would  consider adding on a different agent such as amlodipine and discontinuing the HCTZ. Consider statin and/or cardiac CT for screen.  F/u 5.5 mos.  Vitamin D deficiency - VITAMIN D UTD  Depression, unspecified depression type Managed by psychiatry  Return in about 6 months (around 08/21/2021) for CPE .  No orders of the defined types were placed in this encounter.  Meds ordered this encounter  Medications   hydrochlorothiazide (HYDRODIURIL) 12.5 MG tablet    Sig: Take 1 tablet (12.5 mg total) by mouth daily.    Dispense:  90 tablet    Refill:  1    Referral Orders  No referral(s) requested today     Electronically signed by: Howard Pouch, [place]

## 2021-04-24 ENCOUNTER — Other Ambulatory Visit: Payer: Self-pay | Admitting: Physician Assistant

## 2021-05-10 ENCOUNTER — Telehealth: Payer: Self-pay | Admitting: Family Medicine

## 2021-05-10 DIAGNOSIS — F32A Depression, unspecified: Secondary | ICD-10-CM

## 2021-05-10 NOTE — Addendum Note (Signed)
Addended by: Kavin Leech on: 05/10/2021 01:36 PM   Modules accepted: Orders

## 2021-05-10 NOTE — Telephone Encounter (Signed)
Referral placed.

## 2021-05-10 NOTE — Telephone Encounter (Signed)
Okay to place referral under diagnostic code depression.

## 2021-05-10 NOTE — Telephone Encounter (Signed)
Pt called wanting to get a referral to [facility] [place]

## 2021-05-10 NOTE — Telephone Encounter (Signed)
Please advise 

## 2021-06-06 ENCOUNTER — Encounter: Payer: Self-pay | Admitting: Physician Assistant

## 2021-06-06 ENCOUNTER — Telehealth (INDEPENDENT_AMBULATORY_CARE_PROVIDER_SITE_OTHER): Payer: 59 | Admitting: Physician Assistant

## 2021-06-06 DIAGNOSIS — F3342 Major depressive disorder, recurrent, in full remission: Secondary | ICD-10-CM

## 2021-06-06 DIAGNOSIS — F411 Generalized anxiety disorder: Secondary | ICD-10-CM | POA: Diagnosis not present

## 2021-06-06 DIAGNOSIS — G4700 Insomnia, unspecified: Secondary | ICD-10-CM

## 2021-06-06 MED ORDER — BUPROPION HCL ER (XL) 300 MG PO TB24: 300.0000 mg | ORAL_TABLET | Freq: Every day | ORAL | 3 refills | Status: DC

## 2021-06-06 NOTE — Progress Notes (Signed)
[facility] Med Check  Patient ID: Dawn Scott,  MRN: [id number]  PCP: Ma Hillock, DO  Date of Evaluation: 06/06/2021 Time spent:20 minutes  Chief Complaint:  Chief Complaint   Depression; Anxiety; Insomnia; Follow-up    Virtual Visit via Telehealth  I connected with patient by a video enabled telemedicine application with their informed consent, and verified patient privacy and that I am speaking with the correct person using two identifiers.  I am private, in my office and the patient is at home.  I discussed the limitations, risks, security and privacy concerns of performing an evaluation and management service by video and the availability of in person appointments. I also discussed with the patient that there may be a patient responsible charge related to this service. The patient expressed understanding and agreed to proceed.   I discussed the assessment and treatment plan with the patient. The patient was provided an opportunity to ask questions and all were answered. The patient agreed with the plan and demonstrated an understanding of the instructions.   The patient was advised to call back or seek an in-person evaluation if the symptoms worsen or if the condition fails to improve as anticipated.  I provided 20  minutes of non-face-to-face time during this encounter.   HISTORY/CURRENT STATUS: HPI For routine 34-month med check.  Patient's daughter is sick and she could not come in the office for the appointment.  Luanna is doing really well.  States she is in a good place mentally.  She has made an appointment to see a new counselor after her previous long-time counselor Rhona Raider retired.  Her first appointment will be next week.  Her daughter is a Equities trader in high school and will be leaving for college at [employer] in August.  Matilda knows that we will be a time of huge changes for them both and she wants to speak with someone before she gets to that  point.  She is able to enjoy things.  Energy and motivation are good.  ADLs are normal.  Personal hygiene is normal.  She is sleeping well most of the time.  Work is slower than usual but is starting to pick back up.  She is not missing any work.  Denies suicidal or homicidal thoughts.  Patient denies increased energy with decreased need for sleep, no increased talkativeness, no racing thoughts, no impulsivity or risky behaviors, no increased spending, no increased libido, no grandiosity, no increased irritability or anger, and no hallucinations.  Denies dizziness, syncope, seizures, numbness, tingling, tremor, tics, unsteady gait, slurred speech, confusion. Denies muscle or joint pain, stiffness, or dystonia.  Individual Medical History/ Review of Systems: Changes? :No     Past medications for mental health diagnoses include: Klonopin, Prozac, Prosom, Ambien, Cymbalta, Wellbutrin, Lamictal   Allergies: Augmentin [amoxicillin-pot clavulanate]  Current Medications:  Current Outpatient Medications:    Biotin 1 MG CAPS, Take by mouth., Disp: , Rfl:    Cholecalciferol (VITAMIN D3) 50 MCG (2000 UT) TABS, Take 4,000 Units by mouth every morning., Disp: 60 tablet, Rfl: 11   Cyanocobalamin (B-12 PO), Take 1 tablet by mouth every morning. , Disp: , Rfl:    DULoxetine (CYMBALTA) 30 MG capsule, TAKE 1 CAPSULE BY MOUTH  DAILY, Disp: 90 capsule, Rfl: 3   DULoxetine (CYMBALTA) 60 MG capsule, TAKE 1 CAPSULE BY MOUTH  DAILY, Disp: 90 capsule, Rfl: 3   fluticasone (FLONASE) 50 MCG/ACT nasal spray, Place into both nostrils., Disp: , Rfl:  folic acid (FOLVITE) 1 MG tablet, Take 1 mg by mouth daily., Disp: , Rfl:    hydrochlorothiazide (HYDRODIURIL) 12.5 MG tablet, Take 1 tablet (12.5 mg total) by mouth daily., Disp: 90 tablet, Rfl: 1   lamoTRIgine (LAMICTAL) 150 MG tablet, TAKE 1 TABLET BY MOUTH IN  THE EVENING, Disp: 90 tablet, Rfl: 3   loratadine (CLARITIN) 10 MG tablet, Take 10 mg by mouth every evening.  , Disp: , Rfl:    Multiple Vitamins-Minerals (MULTIVITAMIN WITH MINERALS) tablet, Take 1 tablet by mouth every evening. , Disp: , Rfl:    Probiotic Product (ALIGN PO), Take by mouth., Disp: , Rfl:    buPROPion (WELLBUTRIN XL) 300 MG 24 hr tablet, Take 1 tablet (300 mg total) by mouth daily., Disp: 90 tablet, Rfl: 3 Medication Side Effects: none  Family Medical/ Social History: Changes?  Her daughter will be leaving in August for [employer].  MENTAL HEALTH EXAM:  There were no vitals taken for this visit.There is no height or weight on file to calculate BMI.  General Appearance: Casual, Neat and Well Groomed  Eye Contact:  Good  Speech:  Clear and Coherent and Normal Rate  Volume:  Normal  Mood:  Euthymic  Affect:  Appropriate  Thought Process:  Goal Directed and Descriptions of Associations: Circumstantial  Orientation:  Full (Time, Place, and Person)  Thought Content: Logical   Suicidal Thoughts:  No  Homicidal Thoughts:  No  Memory:  WNL  Judgement:  Good  Insight:  Good  Psychomotor Activity:  Normal  Concentration:  Concentration: Good and Attention Span: Good  Recall:  Good  Fund of Knowledge: Good  Language: Good  Assets:  Desire for Improvement Financial Resources/Insurance Housing Transportation Vocational/Educational  ADL's:  Intact  Cognition: WNL  Prognosis:  Good   Labs  12/10/2020 Na 132   DIAGNOSES:    ICD-10-CM   1. Recurrent major depressive disorder, in full remission ([place])  F33.42     2. Generalized anxiety disorder  F41.1     3. Insomnia, unspecified type  G47.00        Receiving Psychotherapy: No     RECOMMENDATIONS:  PDMP was reviewed.  No results available. I provided 20 minutes of non-face-to-face time during this encounter, including time spent before and after the visit in records review, medical decision making, counseling pertinent to today's visit, and charting.  I am glad to hear that she is doing well.  Also glad she is  being proactive as far as seeing a new counselor now in respect to her daughter going off to college. Patsey will be seeing her PCP sometime in the next few months and will have labs done at that time.  Continue vitamin D 2000 IUs to 2 p.o. daily.  Continue Wellbutrin XL 300 mg, 1 p.o. every morning. Continue Cymbalta total of 90 mg daily. Continue Lamictal 150 mg daily. Continue multivitamin, vitamin D, B12, and biotin. Return in 6 months.  Donnal Moat, PA-C

## 2021-06-10 ENCOUNTER — Ambulatory Visit: Payer: Self-pay | Admitting: Psychology

## 2021-06-10 ENCOUNTER — Ambulatory Visit (INDEPENDENT_AMBULATORY_CARE_PROVIDER_SITE_OTHER): Payer: 59 | Admitting: Psychology

## 2021-06-10 DIAGNOSIS — F411 Generalized anxiety disorder: Secondary | ICD-10-CM | POA: Diagnosis not present

## 2021-06-10 DIAGNOSIS — F3342 Major depressive disorder, recurrent, in full remission: Secondary | ICD-10-CM
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
## 2021-06-17 ENCOUNTER — Ambulatory Visit (INDEPENDENT_AMBULATORY_CARE_PROVIDER_SITE_OTHER): Payer: 59 | Admitting: Psychology

## 2021-06-17 DIAGNOSIS — F411 Generalized anxiety disorder: Secondary | ICD-10-CM

## 2021-06-17 DIAGNOSIS — F3342 Major depressive disorder, recurrent, in full remission: Secondary | ICD-10-CM | POA: Diagnosis not present

## 2021-06-17 NOTE — Progress Notes (Signed)
[place] Counselor/Therapist Progress Note  Patient ID: Dawn Scott, MRN: [id number]   Date: 06/17/21  Time Spent: 4:04  pm - 4:49 pm : 45 Minutes  Treatment Type: Individual Therapy.  Reported Symptoms: depression and anxiety.   Mental Status Exam: Appearance:  Casual     Behavior: Appropriate  Motor: Normal  Speech/Language:  Clear and Coherent and Normal Rate  Affect: Congruent  Mood: normal  Thought process: normal  Thought content:   WNL  Sensory/Perceptual disturbances:   WNL  Orientation: oriented to person, place, time/date, and situation  Attention: Good  Concentration: Good  Memory: WNL  Fund of knowledge:  Good  Insight:   Good  Judgment:  Good  Impulse Control: Good   Risk Assessment: Danger to Self:  No Self-injurious Behavior: No Danger to Others: No Duty to Warn:no Physical Aggression / Violence:No  Access to Firearms a concern: No  Gang Involvement:No   Subjective:   Dawn Scott participated from home, via video and consented to treatment. Therapist participated from home office. We met online due to [place] pandemic. Dawn Scott reviewed the events of the past week. hWe reviewed numerous treatment approaches including CBT, BA, Problem Solving, and Solution focused therapy. Psych-education regarding the Dawn Scott's diagnosis of Generalized Anxiety Disorder & Recurrent major depressive disorder, in full remission (Dawn Scott) was provided during the session. We discussed Dawn Scott's goals treatment goals which include adjusting to her daughter leaving for college, increasing mindfulness, decreasing rumination, becoming more assertive, managing symptom, engaging in consistent self-care, & processing past events. Dawn Scott provided verbal approval of the treatment plan. Therapist provided therapeutic assignment, a daily check-in regarding mood, physiological symptoms, and needs. We discussed limitations to be placed on this process to reduce likelihood of  rumination.   Interventions: Psycho-education & Goal Setting.   Diagnosis:  Generalized anxiety disorder  Recurrent major depressive disorder, in full remission (Dawn Scott)  Barriers to treatment: "Over thinking", difficulty managing stress, unexpected change.    Treatment Plan:  Client Abilities/Strengths Dawn Scott is positive, forthcoming, and motivated for change for change.   Support System: Family and friends  Client Treatment Preferences Outpatient Therapy.  Client Statement of Needs Dawn Scott would like to work on adjusting to her daughter leaving for college, increased mindfulness, decreased rumination, managing symptom, engaging in consistent self-care, & processing past events.    Treatment Level Weekly  Symptoms  Anxiety: Feeling anxious, difficulty managing worry, worrying about different things, trouble relaxing, irritability  (Status: maintained) Depression: Little interest, depression, poor sleep, lethargy, fluctuating appetite, trouble concentrating.    (Status: maintained)  Goals:   Dawn Scott experiences symptoms of depression and anxiety.   Target Date: 06/18/22 Frequency: Weekly  Progress: 0 Modality: individual    Therapist will provide referrals for additional resources as appropriate.  Therapist will provide psycho-education regarding Dawn Scott diagnosis and corresponding treatment approaches and interventions. Licensed Clinical Social Worker, [name], LCSW will support the patient's ability to achieve the goals identified. will employ CBT, BA, Problem-solving, Solution Focused, Mindfulness,  coping skills, & other evidenced-based practices will be used to promote progress towards healthy functioning to help manage decrease symptoms associated with her diagnosis.   Reduce overall level, frequency, and intensity of the feelings of depression, anxiety and panic evidenced by decreased overall symptoms from 6 to 7 days/week to 0 to 1 days/week per client report for at  least 3 consecutive months. Verbally express understanding of the relationship between feelings of depression, anxiety and their impact on thinking patterns  and behaviors. Verbalize an understanding of the role that distorted thinking plays in creating fears, excessive worry, and ruminations.    Dawn Scott participated in the creation of the treatment plan)  Dawn Irish, LCSW

## 2021-06-26 ENCOUNTER — Encounter: Payer: Self-pay | Admitting: Obstetrics & Gynecology

## 2021-07-04 ENCOUNTER — Ambulatory Visit: Payer: 59 | Admitting: Psychology

## 2021-07-13 ENCOUNTER — Other Ambulatory Visit: Payer: Self-pay | Admitting: Family Medicine

## 2021-07-16 ENCOUNTER — Ambulatory Visit (INDEPENDENT_AMBULATORY_CARE_PROVIDER_SITE_OTHER): Payer: 59 | Admitting: Psychology

## 2021-07-16 DIAGNOSIS — F32A Depression, unspecified: Secondary | ICD-10-CM | POA: Diagnosis not present

## 2021-07-16 DIAGNOSIS — F411 Generalized anxiety disorder: Secondary | ICD-10-CM | POA: Diagnosis not present

## 2021-07-16 NOTE — Progress Notes (Signed)
[facility] Counselor/Therapist Progress Note ? ?Patient ID: Dawn Scott, MRN: [id number]  ? ?Date: 07/16/21 ? ?Time Spent: 8:07  am - 8:55 pm : 48 Minutes ? ?Treatment Type: Individual Therapy. ? ?Reported Symptoms: depression and anxiety.  ? ?Mental Status Exam: ?Appearance:  Casual     ?Behavior: Appropriate  ?Motor: Normal  ?Speech/Language:  Clear and Coherent and Normal Rate  ?Affect: Congruent  ?Mood: dysthymic  ?Thought process: normal  ?Thought content:   WNL  ?Sensory/Perceptual disturbances:   WNL  ?Orientation: oriented to person, place, time/date, and situation  ?Attention: Good  ?Concentration: Good  ?Memory: WNL  ?Fund of knowledge:  Good  ?Insight:   Good  ?Judgment:  Good  ?Impulse Control: Good  ? ?Risk Assessment: ?Danger to Self:  No ?Self-injurious Behavior: No ?Danger to Others: No ?Duty to Warn:no ?Physical Aggression / Violence:No  ?Access to Firearms a concern: No  ?Gang Involvement:No  ? ?Subjective:  ? ?Dan Europe participated from home, via video and consented to treatment. Therapist participated from home office. We met online due to [place] pandemic. Iesha reviewed the events of the past week. Renelda noted difficulty adjusting to numerous supervisor changes, specifically 4 in two years. She noted shifts in her supervisors which in-turn affects priorities and areas of concentration for her role.  We explored the stressors in her current coping.  Therapist encouraged Reita to do any areas of control out of control and focus on areas of control.  Ghadeer endorsed increased anxiety regarding her daughter's mental health.  She noted her daughter struggling managing her mood and having a history of being prescribed psychotropic medication.  She noted difficulty identifying ways to help and wanting to maintain her daughter's autonomy.  We explored this during session ways to provide support, improve communication regarding daughter's needs, maintain boundaries for self.  We discussed  the aforementioned task of delineating areas of control and lack of control and focusing on areas of control.  We also reviewed Jeneen Rinks coping.  Leena endorsed using healthy distractions but discussed difficulty maintaining concentration specifically while reading.  She noted this happening for some time (a couple of years).  She denied any inattention in other areas.  She denied any history of ADHD but did note a history of ADHD of her niece.  Therapist provided psychoeducation regarding ADHD and provided a measure to be completed between sessions.  Therapist provided ASRS v1.1 to complete between sessions. Therapist encouraged Niyla to identify previously enjoyable activities and tasks and to create a list to be processed going forward.  Aarian was engaged and motivated during the session and expressed her commitment towards her goals.  Therapist praised Yasheka for her effort and energy and provided supportive therapy.  Follow-up was scheduled. ? ?Interventions: CBT & psycho-education ? ?Diagnosis:  ?Generalized anxiety disorder ? ?Depression, unspecified depression type ? ?Barriers to treatment: ?"Over thinking", difficulty managing stress, unexpected change.   ? ?Treatment Plan: ? ?Client Abilities/Strengths ?Ilhan is positive, forthcoming, and motivated for change for change.  ? ?Support System: ?Family and friends ? ?Client Treatment Preferences ?Outpatient Therapy. ? ?Client Statement of Needs ?Poppy would like to work on adjusting to her daughter leaving for college, increased mindfulness, decreased rumination, managing symptom, engaging in consistent self-care, & processing past events.   ? ?Treatment Level ?Weekly ? ?Symptoms ? ?Anxiety: Feeling anxious, difficulty managing worry, worrying about different things, trouble relaxing, irritability  (Status: maintained) ?Depression: Little interest, depression, poor sleep, lethargy, fluctuating appetite, trouble concentrating.    (  Status: maintained) ? ?Goals:  ? ?Larayah  experiences symptoms of depression and anxiety. ? ? ?Target Date: 06/18/22 Frequency: Weekly  ?Progress: 0 Modality: individual  ? ? ?Therapist will provide referrals for additional resources as appropriate.  ?Therapist will provide psycho-education regarding Pachia's diagnosis and corresponding treatment approaches and interventions. ?Licensed Clinical Social Worker, [name], LCSW will support the patient's ability to achieve the goals identified. will employ CBT, BA, Problem-solving, Solution Focused, Mindfulness,  coping skills, & other evidenced-based practices will be used to promote progress towards healthy functioning to help manage decrease symptoms associated with her diagnosis.  ? Reduce overall level, frequency, and intensity of the feelings of depression, anxiety and panic evidenced by decreased overall symptoms from 6 to 7 days/week to 0 to 1 days/week per client report for at least 3 consecutive months. ?Verbally express understanding of the relationship between feelings of depression, anxiety and their impact on thinking patterns and behaviors. ?Verbalize an understanding of the role that distorted thinking plays in creating fears, excessive worry, and ruminations. ? ? ? ?([name] participated in the creation of the treatment plan) ? ?[name], LCSW ? ? ? ? ? ? ? ? ? ? ? ? ? ? ?[name], LCSW ?

## 2021-07-29 ENCOUNTER — Ambulatory Visit (INDEPENDENT_AMBULATORY_CARE_PROVIDER_SITE_OTHER): Payer: 59 | Admitting: Psychology

## 2021-07-29 DIAGNOSIS — F32A Depression, unspecified: Secondary | ICD-10-CM

## 2021-07-29 DIAGNOSIS — F411 Generalized anxiety disorder: Secondary | ICD-10-CM | POA: Diagnosis not present

## 2021-07-29 NOTE — Progress Notes (Signed)
[place] Counselor/Therapist Progress Note ? ?Patient ID: Dawn Scott, MRN: [id number]  ? ?Date: 07/29/21 ? ?Time Spent: 8:02 am - 8:55 am : 53 Minutes ? ?Treatment Type: Individual Therapy. ? ?Reported Symptoms: depression and anxiety.  ? ?Mental Status Exam: ?Appearance:  Neat and Well Groomed     ?Behavior: Appropriate  ?Motor: Normal  ?Speech/Language:  Clear and Coherent and Normal Rate  ?Affect: Congruent  ?Mood: normal  ?Thought process: normal  ?Thought content:   WNL  ?Sensory/Perceptual disturbances:   WNL  ?Orientation: oriented to person, place, time/date, and situation  ?Attention: Good  ?Concentration: Good  ?Memory: WNL  ?Fund of knowledge:  Good  ?Insight:   Good  ?Judgment:  Good  ?Impulse Control: Good  ? ?Risk Assessment: ?Danger to Self:  No ?Self-injurious Behavior: No ?Danger to Others: No ?Duty to Warn:no ?Physical Aggression / Violence:No  ?Access to Firearms a concern: No  ?Gang Involvement:No  ? ?Subjective:  ? ?Dawn Scott participated from home, via video and consented to treatment. Therapist participated from home office. We met online due to [place] pandemic. Dawn Scott reviewed the events of the past week. Dawn Scott completed the ASRS V1.1 and endorsed 5 symptoms in part-A and is a positive screening. Please see screening below. We reviewed the results during the session. Dawn Scott endorsed specific symptoms including jumping from one task to another, delaying tasks that require concentration, & difficulty retaining information (ex. Reading). In-depth psycho-education regarding ADHD was provided during the session including possible evaluation options and possible pharmacological treatment. Dawn Scott discussed a recent work related transition, a new position, which we explored this during the session. Dawn Scott was engaged and motivated during the session and expressed her commitment towards her goals.  Therapist praised Dawn Scott for her effort and energy and provided supportive therapy.   Follow-up was scheduled. ? ?          Adult ADHD Self-Report Scale (ASRS-v1.1) Symptom Checklist ? ? ?Part-A ?1. How often do you have trouble wrapping up the final details of a project, ?    once the challenging parts have been done? Very Often ?2. How often do you have difficulty getting things in order when you have to do ?    a task that requires organization? Sometimes ?3. How often do you have problems remembering appointments or obligations? ?    Sometimes ?4. When you have a task that requires a lot of thought, how often do you avoid ?    or delay getting started? Very Often ?5. How often do you fidget or squirm with your hands or feet when you have ?    to sit down for a long time? Sometimes ?6. How often do you feel overly active and compelled to do things, like you ?    were driven by a motor? Often ? ?Part-B ?7. How often do you make careless mistakes when you have to work on a boring or ?    difficult project? Rarely ?8. How often do you have difficulty keeping your attention when you are doing boring ?    or repetitive work? Rarely ?9. How often do you have difficulty concentrating on what people say to you, ?     even when they are speaking to you directly? Sometimes ?10. How often do you misplace or have difficulty finding things at home or at work?  ?      Sometimes ?11. How often are you distracted by activity or noise around you? Often ?12.  How often do you leave your seat in meetings or other situations in which ?      you are expected to remain seated? Never ?13. How often do you feel restless or fidgety? Never ?14. How often do you have difficulty unwinding and relaxing when you have time ?      to yourself? Rarely ?15. How often do you find yourself talking too much when you are in social situations?  ?      Rarely ?16. When you?re in a conversation, how often do you find yourself finishing     ?      the sentences of the people you are talking to, before they can finish ?      them  themselves? Never ?17. How often do you have difficulty waiting your turn in situations when ?      turn taking is required? Never ?18. How often do you interrupt others when they are busy? Sometimes ? ? ? ? ? ?Interventions:  psycho-education ? ?Diagnosis:  ?Generalized anxiety disorder ? ?Depression, unspecified depression type ? ?Barriers to treatment: ?"Over thinking", difficulty managing stress, unexpected change.   ? ?Treatment Plan: ? ?Client Abilities/Strengths ?Dawn Scott is positive, forthcoming, and motivated for change for change.  ? ?Support System: ?Family and friends ? ?Client Treatment Preferences ?Outpatient Therapy. ? ?Client Statement of Needs ?Dawn Scott would like to work on adjusting to her daughter leaving for college, increased mindfulness, decreased rumination, managing symptom, engaging in consistent self-care, & processing past events.   ? ?Treatment Level ?Weekly ? ?Symptoms ? ?Anxiety: Feeling anxious, difficulty managing worry, worrying about different things, trouble relaxing, irritability  (Status: maintained) ?Depression: Little interest, depression, poor sleep, lethargy, fluctuating appetite, trouble concentrating.    (Status: maintained) ? ?Goals:  ? ?Dawn Scott experiences symptoms of depression and anxiety. ? ? ?Target Date: 06/18/22 Frequency: Weekly  ?Progress: 0 Modality: individual  ? ? ?Therapist will provide referrals for additional resources as appropriate.  ?Therapist will provide psycho-education regarding Dawn Scott's diagnosis and corresponding treatment approaches and interventions. ?Licensed Clinical Social Worker, [name], LCSW will support the patient's ability to achieve the goals identified. will employ CBT, BA, Problem-solving, Solution Focused, Mindfulness,  coping skills, & other evidenced-based practices will be used to promote progress towards healthy functioning to help manage decrease symptoms associated with her diagnosis.  ? Reduce overall level, frequency, and intensity  of the feelings of depression, anxiety and panic evidenced by decreased overall symptoms from 6 to 7 days/week to 0 to 1 days/week per client report for at least 3 consecutive months. ?Verbally express understanding of the relationship between feelings of depression, anxiety and their impact on thinking patterns and behaviors. ?Verbalize an understanding of the role that distorted thinking plays in creating fears, excessive worry, and ruminations. ? ? ? ?(Dawn Scott participated in the creation of the treatment plan) ? ?[name], LCSW ?

## 2021-08-06 ENCOUNTER — Telehealth: Payer: Self-pay

## 2021-08-06 NOTE — Telephone Encounter (Signed)
I called patient because she had screening mammo performed with [facility] on 06/19/21. Follow up diagnostic testing is recommended but [facility] sent [place] a letter stating that patient has not yet returned or is scheduled. ? ?I called and left a message for patient to call me to follow up regarding her recent mammogram. ?

## 2021-08-08 NOTE — Telephone Encounter (Signed)
Spoke with patient. She is aware of recommendation on her Mammo report and would like me to proceed with scheduling this appt at [facility].   ?

## 2021-08-08 NOTE — Telephone Encounter (Signed)
Per [facility] result recommendation I scheduled patient for diagnostic left breast mammo and possibly left breast u/s at [facility].  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  Patient is scheduled for 08/14/21 at 8:15am to check in at 8am. ? ?I spoke with patient and informed her. Instructions reviewed.  She was very appreciative of the assistance with getting this appointment scheduled. ?

## 2021-08-12 ENCOUNTER — Ambulatory Visit: Payer: 59 | Admitting: Psychology

## 2021-08-16 LAB — HM MAMMOGRAPHY

## 2021-08-20 ENCOUNTER — Encounter: Payer: Self-pay | Admitting: Obstetrics & Gynecology

## 2021-08-21 ENCOUNTER — Encounter: Payer: Self-pay | Admitting: Family Medicine

## 2021-08-21 ENCOUNTER — Ambulatory Visit (INDEPENDENT_AMBULATORY_CARE_PROVIDER_SITE_OTHER): Payer: 59 | Admitting: Family Medicine

## 2021-08-21 VITALS — BP 131/84 | HR 80 | Temp 97.6°F | Ht 69.0 in | Wt 198.0 lb

## 2021-08-21 DIAGNOSIS — Z8279 Family history of other congenital malformations, deformations and chromosomal abnormalities: Secondary | ICD-10-CM

## 2021-08-21 DIAGNOSIS — Z87898 Personal history of other specified conditions: Secondary | ICD-10-CM

## 2021-08-21 DIAGNOSIS — D1801 Hemangioma of skin and subcutaneous tissue: Secondary | ICD-10-CM

## 2021-08-21 DIAGNOSIS — I10 Essential (primary) hypertension: Secondary | ICD-10-CM | POA: Diagnosis not present

## 2021-08-21 DIAGNOSIS — E663 Overweight: Secondary | ICD-10-CM | POA: Diagnosis not present

## 2021-08-21 DIAGNOSIS — Z0000 Encounter for general adult medical examination without abnormal findings: Secondary | ICD-10-CM

## 2021-08-21 DIAGNOSIS — L814 Other melanin hyperpigmentation: Secondary | ICD-10-CM | POA: Insufficient documentation

## 2021-08-21 DIAGNOSIS — E559 Vitamin D deficiency, unspecified: Secondary | ICD-10-CM | POA: Diagnosis not present

## 2021-08-21 DIAGNOSIS — R87612 Low grade squamous intraepithelial lesion on cytologic smear of cervix (LGSIL): Secondary | ICD-10-CM

## 2021-08-21 HISTORY — DX: Hemangioma of skin and subcutaneous tissue: D18.01

## 2021-08-21 HISTORY — DX: Personal history of other specified conditions: Z87.898

## 2021-08-21 MED ORDER — HYDROCHLOROTHIAZIDE 12.5 MG PO TABS: 12.5000 mg | ORAL_TABLET | Freq: Every day | ORAL | 1 refills | Status: DC

## 2021-08-21 MED ORDER — HYDROCHLOROTHIAZIDE 12.5 MG PO TABS: 12.5000 mg | ORAL_TABLET | Freq: Every day | ORAL | 3 refills | Status: DC

## 2021-08-21 NOTE — Patient Instructions (Signed)
No follow-ups on file.        Great to see you today.  I have refilled the medication(s) we provide.   If labs were collected, we will inform you of lab results once received either by echart message or telephone call.   - echart message- for normal results that have been seen by the patient already.   - telephone call: abnormal results or if patient has not viewed results in their echart.  Health Maintenance, Female Adopting a healthy lifestyle and getting preventive care are important in promoting health and wellness. Ask your health care provider about: The right schedule for you to have regular tests and exams. Things you can do on your own to prevent diseases and keep yourself healthy. What should I know about diet, weight, and exercise? Eat a healthy diet  Eat a diet that includes plenty of vegetables, fruits, low-fat dairy products, and lean protein. Do not eat a lot of foods that are high in solid fats, added sugars, or sodium. Maintain a healthy weight Body mass index (BMI) is used to identify weight problems. It estimates body fat based on height and weight. Your health care provider can help determine your BMI and help you achieve or maintain a healthy weight. Get regular exercise Get regular exercise. This is one of the most important things you can do for your health. Most adults should: Exercise for at least 150 minutes each week. The exercise should increase your heart rate and make you sweat (moderate-intensity exercise). Do strengthening exercises at least twice a week. This is in addition to the moderate-intensity exercise. Spend less time sitting. Even light physical activity can be beneficial. Watch cholesterol and blood lipids Have your blood tested for lipids and cholesterol at 50 years of age, then have this test every 5 years. Have your cholesterol levels checked more often if: Your lipid or cholesterol levels are high. You are older than 50 years of  age. You are at high risk for heart disease. What should I know about cancer screening? Depending on your health history and family history, you may need to have cancer screening at various ages. This may include screening for: Breast cancer. Cervical cancer. Colorectal cancer. Skin cancer. Lung cancer. What should I know about heart disease, diabetes, and high blood pressure? Blood pressure and heart disease High blood pressure causes heart disease and increases the risk of stroke. This is more likely to develop in people who have high blood pressure readings or are overweight. Have your blood pressure checked: Every 3-5 years if you are 18-39 years of age. Every year if you are 40 years old or older. Diabetes Have regular diabetes screenings. This checks your fasting blood sugar level. Have the screening done: Once every three years after age 40 if you are at a normal weight and have a low risk for diabetes. More often and at a younger age if you are overweight or have a high risk for diabetes. What should I know about preventing infection? Hepatitis B If you have a higher risk for hepatitis B, you should be screened for this virus. Talk with your health care provider to find out if you are at risk for hepatitis B infection. Hepatitis C Testing is recommended for: Everyone born from 1945 through 1965. Anyone with known risk factors for hepatitis C. Sexually transmitted infections (STIs) Get screened for STIs, including gonorrhea and chlamydia, if: You are sexually active and are younger than 50 years of age. You are   older than 50 years of age and your health care provider tells you that you are at risk for this type of infection. Your sexual activity has changed since you were last screened, and you are at increased risk for chlamydia or gonorrhea. Ask your health care provider if you are at risk. Ask your health care provider about whether you are at high risk for HIV. Your health  care provider may recommend a prescription medicine to help prevent HIV infection. If you choose to take medicine to prevent HIV, you should first get tested for HIV. You should then be tested every 3 months for as long as you are taking the medicine. Pregnancy If you are about to stop having your period (premenopausal) and you may become pregnant, seek counseling before you get pregnant. Take 400 to 800 micrograms (mcg) of folic acid every day if you become pregnant. Ask for birth control (contraception) if you want to prevent pregnancy. Osteoporosis and menopause Osteoporosis is a disease in which the bones lose minerals and strength with aging. This can result in bone fractures. If you are 65 years old or older, or if you are at risk for osteoporosis and fractures, ask your health care provider if you should: Be screened for bone loss. Take a calcium or vitamin D supplement to lower your risk of fractures. Be given hormone replacement therapy (HRT) to treat symptoms of menopause. Follow these instructions at home: Alcohol use Do not drink alcohol if: Your health care provider tells you not to drink. You are pregnant, may be pregnant, or are planning to become pregnant. If you drink alcohol: Limit how much you have to: 0-1 drink a day. Know how much alcohol is in your drink. In the [place] one drink equals one 12 oz bottle of beer (355 mL), one 5 oz glass of wine (148 mL), or one 1 oz glass of hard liquor (44 mL). Lifestyle Do not use any products that contain nicotine or tobacco. These products include cigarettes, chewing tobacco, and vaping devices, such as e-cigarettes. If you need help quitting, ask your health care provider. Do not use street drugs. Do not share needles. Ask your health care provider for help if you need support or information about quitting drugs. General instructions Schedule regular health, dental, and eye exams. Stay current with your vaccines. Tell your health  care provider if: You often feel depressed. You have ever been abused or do not feel safe at home. Summary Adopting a healthy lifestyle and getting preventive care are important in promoting health and wellness. Follow your health care provider's instructions about healthy diet, exercising, and getting tested or screened for diseases. Follow your health care provider's instructions on monitoring your cholesterol and blood pressure. This information is not intended to replace advice given to you by your health care provider. Make sure you discuss any questions you have with your health care provider. Document Revised: 09/03/2020 Document Reviewed: 09/03/2020 Elsevier Patient Education  2023 Elsevier Inc.  

## 2021-08-21 NOTE — Progress Notes (Signed)
? ?This visit occurred during the SARS-CoV-2 public health emergency.  Safety protocols were in place, including screening questions prior to the visit, additional usage of staff PPE, and extensive cleaning of exam room while observing appropriate contact time as indicated for disinfecting solutions.  ? ? ?Patient ID: Dawn Scott, female  DOB: Dec 15, 1970, 51 y.o.   MRN: [id number] ?Patient Care Team  ?  Relationship Specialty Notifications Start End  ?Ma Hillock, DO PCP - General Family Medicine  06/28/15   ?Princess Bruins, MD Consulting Physician Obstetrics and Gynecology  06/28/15   ?Milus Banister, MD Attending Physician Gastroenterology  08/21/21   ?[name], [place] Social Worker Psychology  08/21/21   ? ? ?Chief Complaint  ?Patient presents with  ? Annual Exam  ?  Pt is not fasting  ? ? ?Subjective: ?Dawn Scott is a 51 y.o.  Female  present for CPE/CMC ?All past medical history, surgical history, allergies, family history, immunizations, medications and social history were updated in the electronic medical record today. ?All recent labs, ED visits and hospitalizations within the last year were reviewed. ? ?Health maintenance:  ?Colonoscopy: no fhx. Dr. Ardis Hughs 02/04/2021> 10 yr f/u ?Mammogram: completed: 06/2021 abnl- [facility] image> rpt image 08/16/2021 normal; WS. Managed by gyn. ?Cervical cancer screening: last pap: 10/2020, ASCUS/HPV neg- results: Dr. Dellis Filbert. 1 yr f/u ?Immunizations: tdap UTD 2016, Influenza UTD (encouraged yearly), covid x3 completed. Counseled shingrix series by nurse visit if desired after 01/03/2022. ?Infectious disease screening: HIV screen completed- Hep C completed ?DEXA: routine screen ?Assistive device: none ?Oxygen PVV:ZSMO ?Patient has a Dental home. ?Hospitalizations/ED visits: reviewed ? ?HTN/obesity/Fhx HD-mother: ?Pt reports compliance  with HCTZ 12.5 mg QD. Blood pressures ranges at home normal. Patient denies chest pain, shortness of breath, dizziness or lower extremity  edema.  ? Pt does not daily baby ASA. Pt is not prescribed statin. ?RF: htn, obesity, FHX HD mother.  ?  ? ? ?  08/21/2021  ?  3:24 PM 09/04/2020  ? 11:07 AM 08/20/2020  ?  9:20 AM 02/23/2019  ?  1:01 PM 06/28/2015  ?  2:17 PM  ?Depression screen PHQ 2/9  ?Decreased Interest 1 0 '1 3 1  '$ ?Down, Depressed, Hopeless 0 0 1 0 0  ?PHQ - 2 Score 1 0 '2 3 1  '$ ?Altered sleeping 1  1 0   ?Tired, decreased energy 2  3 0   ?Change in appetite 1  0 0   ?Feeling bad or failure about yourself  0  0 0   ?Trouble concentrating 1  2 0   ?Moving slowly or fidgety/restless 0  0 0   ?Suicidal thoughts 0  0 0   ?PHQ-9 Score '6  8 3   '$ ?Difficult doing work/chores    Not difficult at all   ? ? ?  08/21/2021  ?  3:24 PM 08/20/2020  ?  9:20 AM 06/28/2015  ?  2:17 PM  ?GAD 7 : Generalized Anxiety Score  ?Nervous, Anxious, on Edge 1 0 0  ?Control/stop worrying 0 0 0  ?Worry too much - different things 0 0 0  ?Trouble relaxing 0 0 0  ?Restless 0 0   ?Easily annoyed or irritable 2 1 0  ?Afraid - awful might happen 0 0 0  ?Total GAD 7 Score 3 1   ? ? ? ?  ?  ? ? ?Immunization History  ?Administered Date(s) Administered  ? Influenza,inj,Quad PF,6+ Mos 02/04/2013, 01/18/2014, 01/22/2015  ? Influenza-Unspecified 02/11/2021  ?  PFIZER(Purple Top)SARS-COV-2 Vaccination 07/14/2019, 08/08/2019, 05/04/2020  ? Pension scheme manager 39yr & up 02/01/2021  ? Tdap 11/24/2014  ? ? ? ?Past Medical History:  ?Diagnosis Date  ? Allergy   ? Anxiety   ? Bloody stool 07/07/2016  ? Clostridioides difficile infection   ? Depression   ? History of ectopic pregnancy   ? 04/ 203/24/2002 S/P  LEFT SALPINGECTOMY  ? History of endometriosis   ? History of ovarian cyst   ? Hypertension   ? Left ovarian cyst   ? Menorrhagia with regular cycle   ? PONV (postoperative nausea and vomiting)   ? Shingles   ? Uterine fibroid   ? ?Allergies  ?Allergen Reactions  ? Augmentin [Amoxicillin-Pot Clavulanate] Other (See Comments)  ?  Diarrhea C.diff positive.  ? ?Past Surgical History:   ?Procedure Laterality Date  ? APPENDECTOMY    ? DILATATION & CURETTAGE/HYSTEROSCOPY WITH MYOSURE N/A 10/30/2016  ? Procedure: , REMOVAL IUD, HYSTEROSCOPY, DILATION AND CURETTAGE;  Surgeon: LPrincess Bruins MD;  [place] [facility]  Service: Gynecology;  Laterality: N/A;  ? DILATION AND EVACUATION  03/10/2003   dr lDellis Filbert ? retained placental tissue postpartum  ? DX LAPAROSCOPY/ LYSIS ADHESIONAS/  FULGERATION ENDOMETRIOSIS/  LEFT SALPINGECTOMY WITH REMOVAL ECOTPIC  08/18/2000   dr tRonita Hipps ? HYSTEROSCOPY WITH NOVASURE N/A 10/30/2016  ? Procedure: HYSTEROSCOPY WITH NOVASURE;  Surgeon: LPrincess Bruins MD;  [facility]  Service: Gynecology;  Laterality: N/A;  ? LAPAROSCOPIC APPENDECTOMY  11/30/2007  ? SEPTOPLASTY  203-24-11 ? TONSILLECTOMY AND ADENOIDECTOMY  child  ? WMathewsEXTRACTION  1996  ? ?Family History  ?Problem Relation Age of Onset  ? Depression Mother   ? Arthritis Mother   ? Heart disease Mother   ? Transient ischemic attack Mother   ? Alcohol abuse Father   ? Depression Father   ? Diabetes Father   ? Alcohol abuse Maternal Grandfather   ? Leukemia Maternal Grandfather   ? Early death Maternal Grandfather   ?     leukemia  ? Breast cancer Maternal Aunt [id number] ? Early death Maternal Aunt   ?     cancer  ? Depression Paternal Uncle   ? Transient ischemic attack Maternal Grandmother   ? Arthritis Paternal Grandmother   ? Dementia Paternal Grandmother   ? Alcohol abuse Paternal Grandfather   ? Early death Paternal Grandfather   ? Colon cancer Neg Hx   ? Colon polyps Neg Hx   ? Esophageal cancer Neg Hx   ? Rectal cancer Neg Hx   ? Stomach cancer Neg Hx   ? ?Social History  ? ?Social History Narrative  ? Widower. Husband died on the job 203-24-2013  ? Bachelor of fine arts, currently not working   ? One child, OMinette Brine  ? No tobacco or drug use, rare use of alcohol.  ? Drinks caffeinated beverages, takes a daily vitamin  ? Wears her seatbelt, wears a bicycle, smoke detectors  in the home  ? exercise at least 3 times a week.  ? Feels safe in her relationships.  ? ? ?Allergies as of 08/21/2021   ? ?   Reactions  ? Augmentin [amoxicillin-pot Clavulanate] Other (See Comments)  ? Diarrhea C.diff positive.  ? ?  ? ?  ?Medication List  ?  ? ?  ? Accurate as of August 21, 2021  4:04 PM. If you have any questions, ask your nurse or doctor.  ?  ?  ? ?  ? ?  ALIGN PO ?Take by mouth. ?  ?B-12 PO ?Take 1 tablet by mouth every morning. ?  ?Biotin 1 MG Caps ?Take by mouth. ?  ?buPROPion 300 MG 24 hr tablet ?Commonly known as: WELLBUTRIN XL ?Take 1 tablet (300 mg total) by mouth daily. ?  ?DULoxetine 60 MG capsule ?Commonly known as: CYMBALTA ?TAKE 1 CAPSULE BY MOUTH  DAILY ?  ?DULoxetine 30 MG capsule ?Commonly known as: CYMBALTA ?TAKE 1 CAPSULE BY MOUTH  DAILY ?  ?fluticasone 50 MCG/ACT nasal spray ?Commonly known as: FLONASE ?Place into both nostrils. ?  ?folic acid 1 MG tablet ?Commonly known as: FOLVITE ?Take 1 mg by mouth daily. ?  ?hydrochlorothiazide 12.5 MG tablet ?Commonly known as: HYDRODIURIL ?Take 1 tablet (12.5 mg total) by mouth daily. ?  ?lamoTRIgine 150 MG tablet ?Commonly known as: LAMICTAL ?TAKE 1 TABLET BY MOUTH IN  THE EVENING ?  ?loratadine 10 MG tablet ?Commonly known as: CLARITIN ?Take 10 mg by mouth every evening. ?  ?multivitamin with minerals tablet ?Take 1 tablet by mouth every evening. ?  ?Vitamin D3 50 MCG (2000 UT) Tabs ?Take 4,000 Units by mouth every morning. ?  ? ?  ? ? ?All past medical history, surgical history, allergies, family history, immunizations andmedications were updated in the EMR today and reviewed under the history and medication portions of their EMR.    ? ?Recent Results (from the past 2160 hour(s))  ?[facility] MAMMOGRAPHY     Status: None  ? Collection Time: 08/16/21 12:00 AM  ?Result Value Ref Range  ? [facility] Mammogram 0-4 Bi-Rad 0-4 Bi-Rad, Self Reported Normal  ? ? ?No results found. ? ? ?ROS ?14 pt review of systems performed and negative (unless mentioned in an  HPI) ? ?Objective: ?BP 131/84   Pulse 80   Temp 97.6 ?F (36.4 ?C) (Oral)   Ht '5\' 9"'$  (1.753 m)   Wt 198 lb (89.8 kg)   SpO2 100%   BMI 29.24 kg/m?  ?Physical Exam ?Vitals and nursing note reviewed.  ?Cons

## 2021-08-22 ENCOUNTER — Encounter: Payer: Self-pay | Admitting: Family Medicine

## 2021-08-22 ENCOUNTER — Telehealth: Payer: Self-pay | Admitting: Family Medicine

## 2021-08-22 LAB — COMPREHENSIVE METABOLIC PANEL
AG Ratio: 1.7 (calc) (ref 1.0–2.5)
ALT: 8 U/L (ref 6–29)
AST: 16 U/L (ref 10–35)
Albumin: 4.3 g/dL (ref 3.6–5.1)
Alkaline phosphatase (APISO): 45 U/L (ref 31–125)
BUN: 9 mg/dL (ref 7–25)
CO2: 29 mmol/L (ref 20–32)
Calcium: 9.4 mg/dL (ref 8.6–10.2)
Chloride: 92 mmol/L — ABNORMAL LOW (ref 98–110)
Creat: 0.82 mg/dL (ref 0.50–0.99)
Globulin: 2.5 g/dL (calc) (ref 1.9–3.7)
Glucose, Bld: 76 mg/dL (ref 65–99)
Potassium: 3.8 mmol/L (ref 3.5–5.3)
Sodium: 129 mmol/L — ABNORMAL LOW (ref 135–146)
Total Bilirubin: 0.3 mg/dL (ref 0.2–1.2)
Total Protein: 6.8 g/dL (ref 6.1–8.1)

## 2021-08-22 LAB — CBC
HCT: 38.9 % (ref 35.0–45.0)
Hemoglobin: 13.4 g/dL (ref 11.7–15.5)
MCH: 30.3 pg (ref 27.0–33.0)
MCHC: 34.4 g/dL (ref 32.0–36.0)
MCV: 88 fL (ref 80.0–100.0)
MPV: 11.2 fL (ref 7.5–12.5)
Platelets: 328 10*3/uL (ref 140–400)
RBC: 4.42 10*6/uL (ref 3.80–5.10)
RDW: 12.7 % (ref 11.0–15.0)
WBC: 5.8 10*3/uL (ref 3.8–10.8)

## 2021-08-22 LAB — TSH: TSH: 1.88 mIU/L

## 2021-08-22 LAB — LIPID PANEL
Cholesterol: 214 mg/dL — ABNORMAL HIGH (ref <200)
HDL: 68 mg/dL (ref >=50)
LDL Cholesterol (Calc): 120 mg/dL (calc) — ABNORMAL HIGH
Non-HDL Cholesterol (Calc): 146 mg/dL (calc) — ABNORMAL HIGH (ref <130)
Total CHOL/HDL Ratio: 3.1 (calc) (ref <5.0)
Triglycerides: 147 mg/dL (ref <150)

## 2021-08-22 LAB — VITAMIN D 25 HYDROXY (VIT D DEFICIENCY, FRACTURES): Vit D, 25-Hydroxy: 44 ng/mL (ref 30–100)

## 2021-08-22 LAB — HEMOGLOBIN A1C
Hgb A1c MFr Bld: 5 % of total Hgb (ref <5.7)
Mean Plasma Glucose: 97 mg/dL
eAG (mmol/L): 5.4 mmol/L

## 2021-08-22 MED ORDER — AMLODIPINE BESYLATE 2.5 MG PO TABS: 2.5000 mg | ORAL_TABLET | Freq: Every day | ORAL | 1 refills | Status: DC

## 2021-08-22 NOTE — Telephone Encounter (Signed)
Spoke with pt regarding labs and instructions.   

## 2021-08-22 NOTE — Telephone Encounter (Signed)
Please call patient ?Liver, kidney and thyroid function are normal ?Blood cell counts  are normal ?Diabetes screening/A1c is normal  ?Cholesterol panel looks good and is at goal for her. ? - Continue the Gatorade and V8 juice supplements that we have discussed in the past. ? - Discontinue HCTZ.  Start new medication called in today called amlodipine.  This will hopefully help her hold onto more sodium by making this change. ? ?Since we did change medications and she has low sodium again, I would encourage her to follow-up in 3 months so we can recheck both. ? ?Please encourage her to call her [facility] so that they discontinue the HCTZ and do not continue to send for her. ? ?

## 2021-08-26 ENCOUNTER — Ambulatory Visit: Payer: 59 | Admitting: Psychology

## 2021-08-27 ENCOUNTER — Ambulatory Visit (INDEPENDENT_AMBULATORY_CARE_PROVIDER_SITE_OTHER): Payer: 59 | Admitting: Psychology

## 2021-08-27 DIAGNOSIS — F411 Generalized anxiety disorder: Secondary | ICD-10-CM | POA: Diagnosis not present

## 2021-08-27 NOTE — Progress Notes (Signed)
[facility] Counselor/Therapist Progress Note ? ?Patient ID: Dawn Scott, MRN: [id number]  ? ?Date: 08/27/21 ? ?Time Spent: 11:03 am -12:00 pm: 57 Minutes ? ?Treatment Type: Individual Therapy. ? ?Reported Symptoms: depression and anxiety.  ? ?Mental Status Exam: ?Appearance:  Neat and Well Groomed     ?Behavior: Appropriate  ?Motor: Normal  ?Speech/Language:  Clear and Coherent and Normal Rate  ?Affect: Tearful  ?Mood: sad  ?Thought process: normal  ?Thought content:   WNL  ?Sensory/Perceptual disturbances:   WNL  ?Orientation: oriented to person, place, time/date, and situation  ?Attention: Good  ?Concentration: Good  ?Memory: WNL  ?Fund of knowledge:  Good  ?Insight:   Good  ?Judgment:  Good  ?Impulse Control: Good  ? ?Risk Assessment: ?Danger to Self:  No ?Self-injurious Behavior: No ?Danger to Others: No ?Duty to Warn:no ?Physical Aggression / Violence:No  ?Access to Firearms a concern: No  ?Gang Involvement:No  ? ?Subjective:  ? ?Dawn Scott participated from home, via video and consented to treatment. Therapist participated from home office. We met online due to [place] pandemic. Dawn Scott reviewed the events of the past week. Dawn Scott noted feeling depressed noting May 13th being the 10 year anniversary of husband's passing. She noted complications including her daughter's moved to college, and feelings of loneliness. Dawn Scott noted worry about her daughter's adjustment and ways to aid in this process. She discussed worry about her own adjustment to [employer] leaving for college. Dawn Scott noted a need to staying busy, reducing down-time, & socializing.  ?We worked on processing her grief, regarding her husband's passing 10 years ago, and discussed ways to [employer].  Therapist encouraged Dawn Scott to create a list of tasks she needs to complete and to work on setting reasonable mini-goals and time-lines. Dawn Scott was engaged and motivated during the session and expressed her commitment towards her goals.  Therapist  praised Dawn Scott for her effort and energy and provided supportive therapy.  Follow-up was scheduled. ? ?Interventions:  grief therapy.  ? ?Diagnosis:  ?Generalized anxiety disorder ? ?Barriers to treatment: ?"Over thinking", difficulty managing stress, unexpected change.   ? ?Treatment Plan: ? ?Client Abilities/Strengths ?Dawn Scott is positive, forthcoming, and motivated for change for change.  ? ?Support System: ?Family and friends ? ?Client Treatment Preferences ?Outpatient Therapy. ? ?Client Statement of Needs ?Dawn Scott would like to work on adjusting to her daughter leaving for college, increased mindfulness, decreased rumination, managing symptom, engaging in consistent self-care, & processing past events.   ? ?Treatment Level ?Weekly ? ?Symptoms ? ?Anxiety: Feeling anxious, difficulty managing worry, worrying about different things, trouble relaxing, irritability  (Status: maintained) ?Depression: Little interest, depression, poor sleep, lethargy, fluctuating appetite, trouble concentrating.    (Status: maintained) ? ?Goals:  ? ?Dawn Scott experiences symptoms of depression and anxiety. ? ? ?Target Date: 06/18/22 Frequency: Weekly  ?Progress: 0 Modality: individual  ? ? ?Therapist will provide referrals for additional resources as appropriate.  ?Therapist will provide psycho-education regarding Dawn Scott diagnosis and corresponding treatment approaches and interventions. ?Licensed Clinical Social Worker, [name], LCSW will support the patient's ability to achieve the goals identified. will employ CBT, BA, Problem-solving, Solution Focused, Mindfulness,  coping skills, & other evidenced-based practices will be used to promote progress towards healthy functioning to help manage decrease symptoms associated with her diagnosis.  ? Reduce overall level, frequency, and intensity of the feelings of depression, anxiety and panic evidenced by decreased overall symptoms from 6 to 7 days/week to 0 to 1 days/week per client report for at  least  3 consecutive months. ?Verbally express understanding of the relationship between feelings of depression, anxiety and their impact on thinking patterns and behaviors. ?Verbalize an understanding of the role that distorted thinking plays in creating fears, excessive worry, and ruminations. ? ? ? ?(Dawn Scott participated in the creation of the treatment plan) ? ?[name], LCSW ?

## 2021-09-09 ENCOUNTER — Ambulatory Visit: Payer: 59 | Admitting: Psychology

## 2021-09-19 ENCOUNTER — Ambulatory Visit (INDEPENDENT_AMBULATORY_CARE_PROVIDER_SITE_OTHER): Payer: 59 | Admitting: Psychology

## 2021-09-19 DIAGNOSIS — F411 Generalized anxiety disorder: Secondary | ICD-10-CM | POA: Diagnosis not present

## 2021-09-19 NOTE — Progress Notes (Signed)
[facility] Counselor/Therapist Progress Note  Patient ID: Dawn Scott, MRN: [id number]   Date: 09/19/21  Time Spent: 11:02 am - 11:46 pm: 44 Minutes  Treatment Type: Individual Therapy.  Reported Symptoms: depression and anxiety.   Mental Status Exam: Appearance:  Neat and Well Groomed     Behavior: Appropriate  Motor: Normal  Speech/Language:  Clear and Coherent and Normal Rate  Affect: Congruent  Mood: normal  Thought process: normal  Thought content:   WNL  Sensory/Perceptual disturbances:   WNL  Orientation: oriented to person, place, time/date, and situation  Attention: Good  Concentration: Good  Memory: WNL  Fund of knowledge:  Good  Insight:   Good  Judgment:  Good  Impulse Control: Good   Risk Assessment: Danger to Self:  No Self-injurious Behavior: No Danger to Others: No Duty to Warn:no Physical Aggression / Violence:No  Access to Firearms a concern: No  Gang Involvement:No   Subjective:   Dawn Scott participated from home, via video and consented to treatment. Therapist participated from home office. We met online due to [place] pandemic. Dawn Scott reviewed the events of the past week. She noted increase in work related stressors and the effect of this stress on her daughter's reaction to Chlora's forgetfulness. 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 She noted a rise in this strain but a need to collaborate going forward. 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 We worked on identifying boundaries for this work relationship, going forward.  Dawn Scott discussed difficulty with sustaining her attention and concentration and noted this affecting her across settings.  We worked on delineating her concerns during the session.  Therapist encouraged Dawn Scott to identify specific examples that have been affecting her across settings to be processed  at a later date.  Therapist provided resources, via email, regarding ADHD and symptom management.  We had previously discussed the possibility of an evaluation and resources were provided during the session.  Dawn Scott was engaged and motivated during the session and expressed her commitment towards her goals.  Therapist praised Dawn Scott for her effort and energy and provided supportive therapy.  Follow-ups were scheduled.  Interventions:  psycho-education, interpersonal  Diagnosis:  No diagnosis found.  Barriers to treatment: "Over thinking", difficulty managing stress, unexpected change.    Treatment Plan:  Client Abilities/Strengths Dawn Scott is positive, forthcoming, and motivated for change for change.   Support System: Family and friends  Client Treatment Preferences Outpatient Therapy.  Client Statement of Needs Dawn Scott would like to work on adjusting to her daughter leaving for college, increased mindfulness, decreased rumination, managing symptom, engaging in consistent self-care, & processing past events.    Treatment Level Weekly  Symptoms  Anxiety: Feeling anxious, difficulty managing worry, worrying about different things, trouble relaxing, irritability  (Status: maintained) Depression: Little interest, depression, poor sleep, lethargy, fluctuating appetite, trouble concentrating.    (Status: maintained)  Goals:   Dawn Scott experiences symptoms of depression and anxiety.   Target Date: 06/18/22 Frequency: Weekly  Progress: 0 Modality: individual    Therapist will provide referrals for additional resources as appropriate.  Therapist will provide psycho-education regarding Dawn Scott's diagnosis and corresponding treatment approaches and interventions. Licensed Clinical Social Worker, [place], LCSW will support the patient's ability to achieve the goals identified. will employ CBT, BA, Problem-solving, Solution Focused, Mindfulness,  coping skills, & other evidenced-based practices  will be used  to promote progress towards healthy functioning to help manage decrease symptoms associated with her diagnosis.   Reduce overall level, frequency, and intensity of the feelings of depression, anxiety and panic evidenced by decreased overall symptoms from 6 to 7 days/week to 0 to 1 days/week per client report for at least 3 consecutive months. Verbally express understanding of the relationship between feelings of depression, anxiety and their impact on thinking patterns and behaviors. Verbalize an understanding of the role that distorted thinking plays in creating fears, excessive worry, and ruminations.    Dawn Scott participated in the creation of the treatment plan)  [name], LCSW

## 2021-09-30 ENCOUNTER — Ambulatory Visit (INDEPENDENT_AMBULATORY_CARE_PROVIDER_SITE_OTHER): Payer: 59 | Admitting: Psychology

## 2021-09-30 DIAGNOSIS — F411 Generalized anxiety disorder: Secondary | ICD-10-CM

## 2021-09-30 NOTE — Progress Notes (Signed)
[facility] Counselor/Therapist Progress Note  Patient ID: Dawn Scott, MRN: [id number]   Date: 09/30/21  Time Spent: 12:30 pm - 1:19 pm: 49 Minutes  Treatment Type: Individual Therapy.  Reported Symptoms: depression and anxiety.   Mental Status Exam: Appearance:  Neat and Well Groomed     Behavior: Appropriate  Motor: Normal  Speech/Language:  Clear and Coherent and Normal Rate  Affect: Congruent  Mood: normal  Thought process: normal  Thought content:   WNL  Sensory/Perceptual disturbances:   WNL  Orientation: oriented to person, place, time/date, and situation  Attention: Good  Concentration: Good  Memory: WNL  Fund of knowledge:  Good  Insight:   Good  Judgment:  Good  Impulse Control: Good   Risk Assessment: Danger to Self:  No Self-injurious Behavior: No Danger to Others: No Duty to Warn:no Physical Aggression / Violence:No  Access to Firearms a concern: No  Gang Involvement:No   Subjective:   Dawn Scott participated from the office with the therapist. Dawn Scott consented to treatment.  Dawn Scott reviewed the events of the past week. 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 She noted a recent loss of a coworker's significant other and noted this was "easier" than she thought. She noted a recent employee review and noted having three bosses in the span of a year. She noted the review, overall, going well.. She noted making headway towards getting an evaluation with [facility] for an ADHD assessment. She noted her interest in dating but discussed needing to wait until there is a good time, which she anticipates should be this summer. She provided details regarding some of past dating history. She noted trepidation regarding engaging in enjoyable activities but noted that her daughter's graduation is looming. We worked on identifying activities that she might enjoy and Therapist reviewed BA including  scheduling, setting reasonable expectations, identifying boundaries, and perpetration.  Dawn Scott was engaged and motivated and will work towards identifying behaviors to engage in. Dawn Scott was engaged and motivated during the session and expressed her commitment towards her goals.  Therapist praised Dawn Scott for her effort and energy and provided supportive therapy.  Follow-ups were scheduled.  Interventions:  BA  Diagnosis:  Generalized anxiety disorder  Barriers to treatment: "Over thinking", difficulty managing stress, unexpected change.    Treatment Plan:  Client Abilities/Strengths Dawn Scott is positive, forthcoming, and motivated for change for change.   Support System: Family and friends  Client Treatment Preferences Outpatient Therapy.  Client Statement of Needs Dawn Scott would like to work on adjusting to her daughter leaving for college, increased mindfulness, decreased rumination, managing symptom, engaging in consistent self-care, & processing past events.    Treatment Level Weekly  Symptoms  Anxiety: Feeling anxious, difficulty managing worry, worrying about different things, trouble relaxing, irritability  (Status: maintained) Depression: Little interest, depression, poor sleep, lethargy, fluctuating appetite, trouble concentrating.    (Status: maintained)  Goals:   Dawn Scott experiences symptoms of depression and anxiety.   Target Date: 06/18/22 Frequency: Weekly  Progress: 0 Modality: individual    Therapist will provide referrals for additional resources as appropriate.  Therapist will provide psycho-education regarding Dawn Scott's diagnosis and corresponding treatment approaches and interventions. Licensed Clinical Social Worker, [place], LCSW will support the patient's ability to achieve the goals identified. will employ CBT, BA, Problem-solving, Solution Focused, Mindfulness,  coping skills, & other evidenced-based practices will be used to promote progress towards healthy  functioning to help manage decrease symptoms associated with  her diagnosis.   Reduce overall level, frequency, and intensity of the feelings of depression, anxiety and panic evidenced by decreased overall symptoms from 6 to 7 days/week to 0 to 1 days/week per client report for at least 3 consecutive months. Verbally express understanding of the relationship between feelings of depression, anxiety and their impact on thinking patterns and behaviors. Verbalize an understanding of the role that distorted thinking plays in creating fears, excessive worry, and ruminations.    Dawn Scott participated in the creation of the treatment plan)  Dawn Irish, LCSW

## 2021-10-28 ENCOUNTER — Ambulatory Visit: Payer: 59 | Admitting: Psychology

## 2021-11-21 ENCOUNTER — Ambulatory Visit: Payer: 59 | Admitting: Obstetrics & Gynecology

## 2021-11-22 ENCOUNTER — Other Ambulatory Visit: Payer: Self-pay | Admitting: Physician Assistant

## 2021-11-25 ENCOUNTER — Encounter: Payer: Self-pay | Admitting: Family Medicine

## 2021-11-25 ENCOUNTER — Ambulatory Visit (INDEPENDENT_AMBULATORY_CARE_PROVIDER_SITE_OTHER): Payer: 59 | Admitting: Family Medicine

## 2021-11-25 VITALS — BP 129/85 | HR 81 | Temp 98.5°F | Ht 69.0 in | Wt 200.0 lb

## 2021-11-25 DIAGNOSIS — Z8249 Family history of ischemic heart disease and other diseases of the circulatory system: Secondary | ICD-10-CM | POA: Diagnosis not present

## 2021-11-25 DIAGNOSIS — I10 Essential (primary) hypertension: Secondary | ICD-10-CM

## 2021-11-25 DIAGNOSIS — E871 Hypo-osmolality and hyponatremia: Secondary | ICD-10-CM | POA: Diagnosis not present

## 2021-11-25 LAB — BASIC METABOLIC PANEL
BUN: 11 mg/dL (ref 6–23)
CO2: 28 mEq/L (ref 19–32)
Calcium: 9.7 mg/dL (ref 8.4–10.5)
Chloride: 96 mEq/L (ref 96–112)
Creatinine, Ser: 0.74 mg/dL (ref 0.40–1.20)
GFR: 94.69 mL/min (ref >60.00)
Glucose, Bld: 81 mg/dL (ref 70–99)
Potassium: 4.3 mEq/L (ref 3.5–5.1)
Sodium: 133 mEq/L — ABNORMAL LOW (ref 135–145)

## 2021-11-25 MED ORDER — AMLODIPINE BESYLATE 2.5 MG PO TABS: 2.5000 mg | ORAL_TABLET | Freq: Every day | ORAL | 0 refills | Status: DC

## 2021-11-25 MED ORDER — AMLODIPINE BESYLATE 2.5 MG PO TABS: 2.5000 mg | ORAL_TABLET | Freq: Every day | ORAL | 1 refills | Status: DC

## 2021-11-25 NOTE — Progress Notes (Signed)
Patient ID: Dawn Scott, female  DOB: 1970-12-24, 51 y.o.   MRN: [id number] Patient Care Team    Relationship Specialty Notifications Start End  Ma Hillock, DO PCP - General Family Medicine  06/28/15   Princess Bruins, MD Consulting Physician Obstetrics and Gynecology  06/28/15   Milus Banister, MD Attending Physician Gastroenterology  08/21/21   [name], [place] Social Worker Psychology  08/21/21     Chief Complaint  Patient presents with   Hypertension    Cmc; pt is not fasting    Subjective: Dawn Scott is a 51 y.o.  Female  present for [facility] All past medical history, surgical history, allergies, family history, immunizations, medications and social history were updated in the electronic medical record today. All recent labs, ED visits and hospitalizations within the last year were reviewed.  HTN/obesity/Fhx HD-mother: Pt reports compliance with amlodipine 2.5 mg QD, which was changed last visit from HCTZ secondary to electrolyte disturbances. Low sodium could also be caused by her mental health medication regimen as well.  Patient denies chest pain, shortness of breath, dizziness or lower extremity edema.   Pt does not daily baby ASA. Pt is not prescribed statin. RF: htn, obesity, FHX HD mother.        08/21/2021    3:24 PM 09/04/2020   11:07 AM 08/20/2020    9:20 AM 02/23/2019    1:01 PM 06/28/2015    2:17 PM  Depression screen PHQ 2/9  Decreased Interest 1 0 '1 3 1  '$ Down, Depressed, Hopeless 0 0 1 0 0  PHQ - 2 Score 1 0 '2 3 1  '$ Altered sleeping 1  1 0   Tired, decreased energy 2  3 0   Change in appetite 1  0 0   Feeling bad or failure about yourself  0  0 0   Trouble concentrating 1  2 0   Moving slowly or fidgety/restless 0  0 0   Suicidal thoughts 0  0 0   PHQ-9 Score '6  8 3   '$ Difficult doing work/chores    Not difficult at all       08/21/2021    3:24 PM 08/20/2020    9:20 AM 06/28/2015    2:17 PM  GAD 7 : Generalized Anxiety Score  Nervous,  Anxious, on Edge 1 0 0  Control/stop worrying 0 0 0  Worry too much - different things 0 0 0  Trouble relaxing 0 0 0  Restless 0 0   Easily annoyed or irritable 2 1 0  Afraid - awful might happen 0 0 0  Total GAD 7 Score 3 1            Immunization History  Administered Date(s) Administered   Influenza,inj,Quad PF,6+ Mos 02/04/2013, 01/18/2014, 01/22/2015   Influenza-Unspecified 02/11/2021   PFIZER(Purple Top)SARS-COV-2 Vaccination 07/14/2019, 08/08/2019, 05/04/2020   Pfizer Covid-19 Vaccine Bivalent Booster 30yr & up 02/01/2021   Tdap 11/24/2014     Past Medical History:  Diagnosis Date   Allergy    Anxiety    Bloody stool 07/07/2016   Clostridioides difficile infection    Depression    Hemangioma of skin and subcutaneous tissue 08/21/2021   History of ectopic pregnancy    04/ 2002  S/P  LEFT SALPINGECTOMY   History of endometriosis    History of neoplasm 08/21/2021   History of ovarian cyst    Hypertension    Left ovarian cyst  Menorrhagia with regular cycle    PONV (postoperative nausea and vomiting)    Shingles    Uterine fibroid    Allergies  Allergen Reactions   Augmentin [Amoxicillin-Pot Clavulanate] Other (See Comments)    Diarrhea C.diff positive.   Past Surgical History:  Procedure Laterality Date   APPENDECTOMY     DILATATION & CURETTAGE/HYSTEROSCOPY WITH MYOSURE N/A 10/30/2016   Procedure: , REMOVAL IUD, HYSTEROSCOPY, DILATION AND CURETTAGE;  Surgeon: Princess Bruins, MD;  Location: [facility];  Service: Gynecology;  Laterality: N/A;   DILATION AND EVACUATION  03/10/2003   dr Dellis Filbert   retained placental tissue postpartum   DX LAPAROSCOPY/ LYSIS ADHESIONAS/  FULGERATION ENDOMETRIOSIS/  LEFT SALPINGECTOMY WITH REMOVAL ECOTPIC  08/18/2000   dr Ronita Hipps   HYSTEROSCOPY WITH NOVASURE N/A 10/30/2016   Procedure: HYSTEROSCOPY WITH NOVASURE;  Surgeon: Princess Bruins, MD;  Location: [facility];  Service: Gynecology;   Laterality: N/A;   LAPAROSCOPIC APPENDECTOMY  11/30/2007   SEPTOPLASTY  2009/07/27   TONSILLECTOMY AND ADENOIDECTOMY  child   WISDOM TOOTH EXTRACTION  1996   Family History  Problem Relation Age of Onset   Depression Mother    Arthritis Mother    Heart disease Mother    Transient ischemic attack Mother    Alcohol abuse Father    Depression Father    Diabetes Father    Alcohol abuse Maternal Grandfather    Leukemia Maternal Grandfather    Early death Maternal Grandfather        leukemia   Breast cancer Maternal Aunt 50   Early death Maternal Aunt        cancer   Depression Paternal Uncle    Transient ischemic attack Maternal Grandmother    Arthritis Paternal Grandmother    Dementia Paternal Grandmother    Alcohol abuse Paternal Grandfather    Early death Paternal Grandfather    Colon cancer Neg Hx    Colon polyps Neg Hx    Esophageal cancer Neg Hx    Rectal cancer Neg Hx    Stomach cancer Neg Hx    Social History   Social History Narrative   Widower. Husband died on the job 2011/07/28.   Bachelor of fine arts, currently not working    One child, Designer, jewellery.   No tobacco or drug use, rare use of alcohol.   Drinks caffeinated beverages, takes a daily vitamin   Wears her seatbelt, wears a bicycle, smoke detectors in the home   exercise at least 3 times a week.   Feels safe in her relationships.    Allergies as of 11/25/2021       Reactions   Augmentin [amoxicillin-pot Clavulanate] Other (See Comments)   Diarrhea C.diff positive.        Medication List        Accurate as of November 25, 2021  1:11 PM. If you have any questions, ask your nurse or doctor.          ALIGN PO Take by mouth.   amLODipine 2.5 MG tablet Commonly known as: NORVASC Take 1 tablet (2.5 mg total) by mouth daily. What changed: Another medication with the same name was added. Make sure you understand how and when to take each. Changed by: Howard Pouch, DO   amLODipine 2.5 MG tablet Commonly known  as: NORVASC Take 1 tablet (2.5 mg total) by mouth daily. What changed: You were already taking a medication with the same name, and this prescription was added. Make sure you  understand how and when to take each. Changed by: Howard Pouch, DO   B-12 PO Take 1 tablet by mouth every morning.   Biotin 1 MG Caps Take by mouth.   buPROPion 300 MG 24 hr tablet Commonly known as: WELLBUTRIN XL Take 1 tablet (300 mg total) by mouth daily.   DULoxetine 60 MG capsule Commonly known as: CYMBALTA TAKE 1 CAPSULE BY MOUTH  DAILY   DULoxetine 30 MG capsule Commonly known as: CYMBALTA TAKE 1 CAPSULE BY MOUTH  DAILY   fluticasone 50 MCG/ACT nasal spray Commonly known as: FLONASE Place into both nostrils.   folic acid 1 MG tablet Commonly known as: FOLVITE Take 1 mg by mouth daily.   lamoTRIgine 150 MG tablet Commonly known as: LAMICTAL TAKE 1 TABLET BY MOUTH IN  THE EVENING   loratadine 10 MG tablet Commonly known as: CLARITIN Take 10 mg by mouth every evening.   multivitamin with minerals tablet Take 1 tablet by mouth every evening.   Vitamin D3 50 MCG (2000 UT) Tabs Take 4,000 Units by mouth every morning.        All past medical history, surgical history, allergies, family history, immunizations andmedications were updated in the EMR today and reviewed under the history and medication portions of their EMR.     No results found for this or any previous visit (from the past 2160 hour(s)).   No results found.   ROS 14 pt review of systems performed and negative (unless mentioned in an HPI)  Objective: BP 129/85   Pulse 81   Temp 98.5 F (36.9 C) (Oral)   Ht '5\' 9"'$  (1.753 m)   Wt 200 lb (90.7 kg)   SpO2 100%   BMI 29.53 kg/m  Physical Exam Vitals and nursing note reviewed.  Constitutional:      General: She is not in acute distress.    Appearance: Normal appearance. She is not ill-appearing, toxic-appearing or diaphoretic.  HENT:     Head: Normocephalic and  atraumatic.  Eyes:     General: No scleral icterus.       Right eye: No discharge.        Left eye: No discharge.     Extraocular Movements: Extraocular movements intact.     Conjunctiva/sclera: Conjunctivae normal.     Pupils: Pupils are equal, round, and reactive to light.  Cardiovascular:     Rate and Rhythm: Normal rate and regular rhythm.  Pulmonary:     Effort: Pulmonary effort is normal. No respiratory distress.     Breath sounds: Normal breath sounds. No wheezing, rhonchi or rales.  Musculoskeletal:     Cervical back: Neck supple. No tenderness.  Lymphadenopathy:     Cervical: No cervical adenopathy.  Skin:    General: Skin is warm and dry.     Coloration: Skin is not jaundiced or pale.     Findings: No erythema or rash.  Neurological:     Mental Status: She is alert and oriented to person, place, and time. Mental status is at baseline.     Motor: No weakness.     Gait: Gait normal.  Psychiatric:        Mood and Affect: Mood normal.        Behavior: Behavior normal.        Thought Content: Thought content normal.        Judgment: Judgment normal.     No results found.  Assessment/plan: Dawn Scott is a 51 y.o. female present  for [facility] HTN/obesity/Fhx HD-mother/overweight: stable Continue Amlodipine 2.5 mg qd Dc'd hctz last visit d/t electrolytes Consider statin and/or cardiac CT for screen.   Hyponatremia: Bmp collected today.  If lytes still abnormal she is encouraged to speak to her mental health provider about medication regimen as cause.    Return in about 24 weeks (around 05/12/2022) for Routine chronic condition follow-up.   Orders Placed This Encounter  Procedures   Basic Metabolic Panel (BMET)   Meds ordered this encounter  Medications   amLODipine (NORVASC) 2.5 MG tablet    Sig: Take 1 tablet (2.5 mg total) by mouth daily.    Dispense:  90 tablet    Refill:  1    .   amLODipine (NORVASC) 2.5 MG tablet    Sig: Take 1 tablet (2.5 mg total)  by mouth daily.    Dispense:  15 tablet    Refill:  0   Referral Orders  No referral(s) requested today    Electronically signed by: Howard Pouch, [place]

## 2021-11-25 NOTE — Patient Instructions (Signed)
Hyponatremia Hyponatremia is when the amount of salt (sodium) in your blood is too low. When salt levels are low, your body cells may take in extra water. This can cause swelling. The swelling often affects the brain. What are the causes? Certain medical problems or conditions. Vomiting a lot. Having watery poop (diarrhea) often. Sweating too much. Taking certain medicines or using illegal drugs. Fluids given through an IV tube. What increases the risk? Having heart, kidney, or liver failure. Having a medical condition that causes you to have watery poop a lot. Doing very hard exercises. Taking medicines that affect the amount of salt that is in your blood. What are the signs or symptoms? Symptoms of this condition include: Headache. Feeling like you may vomit (nausea). Vomiting. Being very tired. Muscle weakness and cramps. Not wanting to eat as much as normal. Feeling weak or dizzy. Very bad symptoms of this condition include: Confusion. Feeling restless. Having a fast heart rate. Fainting. Seizures. Coma. How is this treated? Treatment for this condition depends on the cause. Treatment may include: Getting fluids through an IV tube that is put into one of your veins. Taking medicines to fix the salt levels in your blood. If medicines are causing the problem, your medicines will need to be changed. Limiting how much water or fluid you take in, in some cases. Monitoring in the hospital to watch your symptoms. Follow these instructions at home:  Take over-the-counter and prescription medicines only as told by your doctor. Many medicines can make this condition worse. Talk with your doctor about any medicines that you are taking. Do not drink alcohol. Keep all follow-up visits. Contact a doctor if: You feel more like you may vomit. You feel more tired. Your headache gets worse. You feel more confused. You feel weaker. Your symptoms go away and then they come back. Get  help right away if: You have a seizure. You faint. You keep having watery poop. You keep vomiting. Summary Hyponatremia is when the amount of salt in your blood is too low. When salt levels are low, you can have swelling throughout the body. The swelling mostly affects the brain. Treatment depends on the cause. Treatment may include IV fluids and changing medicines. This information is not intended to replace advice given to you by your health care provider. Make sure you discuss any questions you have with your health care provider. Document Revised: 10/23/2020 Document Reviewed: 10/23/2020 Elsevier Patient Education  2023 Elsevier Inc.  

## 2021-11-26 ENCOUNTER — Ambulatory Visit: Payer: 59 | Admitting: Psychology

## 2021-11-26 NOTE — Telephone Encounter (Signed)
Please schedule appt

## 2021-11-26 NOTE — Telephone Encounter (Signed)
Pt is scheduled 8/17 

## 2021-12-13 ENCOUNTER — Other Ambulatory Visit: Payer: Self-pay | Admitting: Physician Assistant

## 2021-12-16 NOTE — Telephone Encounter (Signed)
Has appt with Helene Kelp 8/23

## 2021-12-18 ENCOUNTER — Telehealth (INDEPENDENT_AMBULATORY_CARE_PROVIDER_SITE_OTHER): Payer: 59 | Admitting: Physician Assistant

## 2021-12-18 ENCOUNTER — Encounter: Payer: Self-pay | Admitting: Physician Assistant

## 2021-12-18 DIAGNOSIS — E871 Hypo-osmolality and hyponatremia: Secondary | ICD-10-CM | POA: Diagnosis not present

## 2021-12-18 DIAGNOSIS — F3342 Major depressive disorder, recurrent, in full remission: Secondary | ICD-10-CM

## 2021-12-18 DIAGNOSIS — F411 Generalized anxiety disorder: Secondary | ICD-10-CM | POA: Diagnosis not present

## 2021-12-18 MED ORDER — DULOXETINE HCL 30 MG PO CPEP: 30.0000 mg | ORAL_CAPSULE | Freq: Every day | ORAL | 3 refills | Status: DC

## 2021-12-18 MED ORDER — DULOXETINE HCL 60 MG PO CPEP: 60.0000 mg | ORAL_CAPSULE | Freq: Every day | ORAL | 3 refills | Status: DC

## 2021-12-18 MED ORDER — LAMOTRIGINE 150 MG PO TABS
150.0000 mg | ORAL_TABLET | Freq: Every evening | ORAL | 3 refills | Status: DC
Start: 2021-12-18 — End: 2022-10-22

## 2021-12-18 NOTE — Progress Notes (Signed)
[facility] Check  Patient ID: Dawn Scott,  MRN: [id number]  PCP: Ma Hillock, DO  Date of Evaluation: 12/18/2021 Time spent:30 minutes  Chief Complaint:  Chief Complaint   Depression; Follow-up    Virtual Visit via Telehealth  I connected with patient by a video enabled telemedicine application with their informed consent, and verified patient privacy and that I am speaking with the correct person using two identifiers.  I am private, in my office and the patient is at home.  I discussed the limitations, risks, security and privacy concerns of performing an evaluation and management service by video and the availability of in person appointments. I also discussed with the patient that there may be a patient responsible charge related to this service. The patient expressed understanding and agreed to proceed.   I discussed the assessment and treatment plan with the patient. The patient was provided an opportunity to ask questions and all were answered. The patient agreed with the plan and demonstrated an understanding of the instructions.   The patient was advised to call back or seek an in-person evaluation if the symptoms worsen or if the condition fails to improve as anticipated.  I provided 30 minutes of non-face-to-face time during this encounter.   HISTORY/CURRENT STATUS: HPI For routine 5-monthmed check.    DWondais doing well.  Feels that her medications are still effective. Patient is able to enjoy things.  Energy and motivation are good.  Work is going well.  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  No extreme sadness, tearfulness, or feelings of hopelessness.  Sleeps well most of the time. ADLs and personal hygiene are normal.   Denies any changes in concentration, making decisions, or remembering things.  Appetite has not changed.  Weight is stable.  No complaints of anxiety.  Denies suicidal or homicidal thoughts.  She was dealing  with low sodiums.  Her primary provider discontinued the HCTZ and started amlodipine her blood pressures have been normal and sodium levels returned to normal.  Patient denies increased energy with decreased need for sleep, increased talkativeness, racing thoughts, impulsivity or risky behaviors, increased spending, increased libido, grandiosity, increased irritability or anger, paranoia, and no hallucinations.  Denies dizziness, syncope, seizures, numbness, tingling, tremor, tics, unsteady gait, slurred speech, confusion. Denies muscle or joint pain, stiffness, or dystonia.  Individual Medical History/ Review of Systems: Changes? :No     Past medications for mental health diagnoses include: Klonopin, Prozac, Prosom, Ambien, Cymbalta, Wellbutrin, Lamictal   Allergies: Augmentin [amoxicillin-pot clavulanate]  Current Medications:  Current Outpatient Medications:    amLODipine (NORVASC) 2.5 MG tablet, Take 1 tablet (2.5 mg total) by mouth daily., Disp: 90 tablet, Rfl: 1   amLODipine (NORVASC) 2.5 MG tablet, Take 1 tablet (2.5 mg total) by mouth daily., Disp: 15 tablet, Rfl: 0   Biotin 1 MG CAPS, Take by mouth., Disp: , Rfl:    buPROPion (WELLBUTRIN XL) 300 MG 24 hr tablet, Take 1 tablet (300 mg total) by mouth daily., Disp: 90 tablet, Rfl: 3   Cholecalciferol (VITAMIN D3) 50 MCG (2000 UT) TABS, Take 4,000 Units by mouth every morning., Disp: 60 tablet, Rfl: 11   Cyanocobalamin (B-12 PO), Take 1 tablet by mouth every morning. , Disp: , Rfl:    fluticasone (FLONASE) 50 MCG/ACT nasal spray, Place into both nostrils., Disp: , Rfl:    folic acid (FOLVITE) 1 MG tablet, Take 1 mg by mouth daily., Disp: , Rfl:  loratadine (CLARITIN) 10 MG tablet, Take 10 mg by mouth every evening. , Disp: , Rfl:    Multiple Vitamins-Minerals (MULTIVITAMIN WITH MINERALS) tablet, Take 1 tablet by mouth every evening. , Disp: , Rfl:    Probiotic Product (ALIGN PO), Take by mouth., Disp: , Rfl:    DULoxetine (CYMBALTA)  30 MG capsule, Take 1 capsule (30 mg total) by mouth daily., Disp: 90 capsule, Rfl: 3   DULoxetine (CYMBALTA) 60 MG capsule, Take 1 capsule (60 mg total) by mouth daily., Disp: 90 capsule, Rfl: 3   lamoTRIgine (LAMICTAL) 150 MG tablet, Take 1 tablet (150 mg total) by mouth every evening., Disp: 90 tablet, Rfl: 3 Medication Side Effects: none  Family Medical/ Social History: Changes?  Her daughter moved to [place] and is a Museum/gallery exhibitions officer at [employer].  She is doing well so that makes [place] okay with this life change.  MENTAL HEALTH EXAM:  There were no vitals taken for this visit.There is no height or weight on file to calculate BMI.  General Appearance: Casual, Neat and Well Groomed  Eye Contact:  Good  Speech:  Clear and Coherent and Normal Rate  Volume:  Normal  Mood:  Euthymic  Affect:  Appropriate  Thought Process:  Goal Directed and Descriptions of Associations: Circumstantial  Orientation:  Full (Time, Place, and Person)  Thought Content: Logical   Suicidal Thoughts:  No  Homicidal Thoughts:  No  Memory:  WNL  Judgement:  Good  Insight:  Good  Psychomotor Activity:  Normal  Concentration:  Concentration: Good and Attention Span: Good  Recall:  Good  Fund of Knowledge: Good  Language: Good  Assets:  Doctor, general practice  ADL's:  Intact  Cognition: WNL  Prognosis:  Good   Labs  11/25/2021 BMP sodium 133, otherwise normal   08/21/2021 CBC completely normal CMP sodium 129, chloride 92, otherwise normal Hemoglobin A1c 5.0 Total cholesterol 214, HDL 68, triglycerides 147, LDL 120 TSH 1.8 Vitamin D 44  DIAGNOSES:    ICD-10-CM   1. Recurrent major depressive disorder, in full remission ([place])  F33.42     2. Generalized anxiety disorder  F41.1     3. Hyponatremia  E87.1       Receiving Psychotherapy: No    RECOMMENDATIONS:  PDMP was reviewed.  No results available. I provided 30 minutes of non-face to face  time during this encounter, including time spent before and after the visit in records review, medical decision making, counseling pertinent to today's visit, and charting.   I am glad to see her doing well.  No change in treatment necessary.  Continue Wellbutrin XL 300 mg, 1 p.o. every morning. Continue Cymbalta 30 mg +60 mg daily. Continue Lamictal 150 mg daily. Continue multivitamin, vitamin D, B12, and biotin. Return in 6 months.  Donnal Moat, PA-C

## 2022-01-06 ENCOUNTER — Other Ambulatory Visit (HOSPITAL_COMMUNITY)
Admission: RE | Admit: 2022-01-06 | Discharge: 2022-01-06 | Disposition: A | Discharge status: Home / Self Care | Payer: 59 | Source: Ambulatory Visit | Attending: Obstetrics & Gynecology | Admitting: Obstetrics & Gynecology

## 2022-01-06 ENCOUNTER — Ambulatory Visit (INDEPENDENT_AMBULATORY_CARE_PROVIDER_SITE_OTHER): Payer: 59 | Admitting: Obstetrics & Gynecology

## 2022-01-06 ENCOUNTER — Encounter: Payer: Self-pay | Admitting: Obstetrics & Gynecology

## 2022-01-06 VITALS — BP 132/76 | HR 65 | Ht 69.0 in | Wt 202.0 lb

## 2022-01-06 DIAGNOSIS — Z01419 Encounter for gynecological examination (general) (routine) without abnormal findings: Secondary | ICD-10-CM | POA: Diagnosis present

## 2022-01-06 DIAGNOSIS — N870 Mild cervical dysplasia: Secondary | ICD-10-CM | POA: Diagnosis present

## 2022-01-06 DIAGNOSIS — N951 Menopausal and female climacteric states: Secondary | ICD-10-CM

## 2022-01-06 NOTE — Progress Notes (Signed)
PRINCES FINGER Apr 05, 1971 [id number]   History:    51 y.o. G2P1A1L1 Widowed.  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   RP:  Established patient presenting for annual gyn exam   HPI: Oligomenorrhea, normal menses every 3-4 months, LMP 12/01/21.  Hot flushes are tolerable.  No pelvic pain.  No abnormal vaginal discharge.  Abstinent currently.  Last Pap ASCUS/HPV HR Neg in 10/2020. HPV HR Pos in 2019.  Pap reflex today.  Urine and bowel movements normal. Colono 01/2021. Breasts normal.  Mammo Neg 07/2021.  Body mass index 29.83.   Walking the dog. Health labs with family physician.   Past medical history,surgical history, family history and social history were all reviewed and documented in the EPIC chart.  Gynecologic History Patient's last menstrual period was 12/01/2021.  Obstetric History OB History  Gravida Para Term Preterm AB Living  '2 1     1 1  '$ SAB IAB Ectopic Multiple Live Births      1        # Outcome Date GA Lbr Len/2nd Weight Sex Delivery Anes PTL Lv  2 Ectopic           1 Para             ROS: A ROS was performed and pertinent positives and negatives are included in the history. GENERAL: No fevers or chills. HEENT: No change in vision, no earache, sore throat or sinus congestion. NECK: No pain or stiffness. CARDIOVASCULAR: No chest pain or pressure. No palpitations. PULMONARY: No shortness of breath, cough or wheeze. GASTROINTESTINAL: No abdominal pain, nausea, vomiting or diarrhea, melena or bright red blood per rectum. GENITOURINARY: No urinary frequency, urgency, hesitancy or dysuria. MUSCULOSKELETAL: No joint or muscle pain, no back pain, no recent trauma. DERMATOLOGIC: No rash, no itching, no lesions. ENDOCRINE: No polyuria, polydipsia, no heat or cold intolerance. No recent change in weight. HEMATOLOGICAL: No anemia or easy bruising or bleeding. NEUROLOGIC: No headache, seizures, numbness, tingling or weakness. PSYCHIATRIC: No depression, no loss  of interest in normal activity or change in sleep pattern.     Exam:   BP 132/76   Pulse 65   Ht '5\' 9"'$  (1.753 m)   Wt 202 lb (91.6 kg)   LMP 12/01/2021   SpO2 100%   BMI 29.83 kg/m   Body mass index is 29.83 kg/m.  General appearance : Well developed well nourished female. No acute distress HEENT: Eyes: no retinal hemorrhage or exudates,  Neck supple, trachea midline, no carotid bruits, no thyroidmegaly Lungs: Clear to auscultation, no rhonchi or wheezes, or rib retractions  Heart: Regular rate and rhythm, no murmurs or gallops Breast:Examined in sitting and supine position were symmetrical in appearance, no palpable masses or tenderness,  no skin retraction, no nipple inversion, no nipple discharge, no skin discoloration, no axillary or supraclavicular lymphadenopathy Abdomen: no palpable masses or tenderness, no rebound or guarding Extremities: no edema or skin discoloration or tenderness  Pelvic: Vulva: Normal             Vagina: No gross lesions or discharge  Cervix: No gross lesions or discharge.  Pap reflex done.  Uterus  AV, normal size, shape and consistency, non-tender and mobile  Adnexa  Without masses or tenderness  Anus: Normal   Assessment/Plan:  51 y.o. female for annual exam   1. Encounter for routine gynecological examination with Papanicolaou smear of cervix Oligomenorrhea, normal menses every 3-4 months, LMP 12/01/21.  Hot  flushes are tolerable.  No pelvic pain.  No abnormal vaginal discharge.  Abstinent currently.  Last Pap ASCUS/HPV HR Neg in 10/2020. HPV HR Pos in 2019.  Pap reflex today.  Urine and bowel movements normal. Colono 01/2021. Breasts normal.  Mammo Neg 07/2021.  Body mass index 29.83.   Walking the dog. Health labs with family physician. - Cytology - PAP( [place])  2. Dysplasia of cervix, low grade (CIN 1) Last Pap ASCUS/HPV HR Neg in 10/2020. HPV HR Pos in 2019.  - Cytology - PAP( [place])  3. Perimenopause  Oligomenorrhea, normal  menses every 3-4 months, LMP 12/01/21.  Hot flushes are tolerable.  No pelvic pain.  No abnormal vaginal discharge.  Abstinent currently.  Princess Bruins MD, 2:30 PM 01/06/2022

## 2022-01-15 LAB — CYTOLOGY - PAP
Diagnosis: NEGATIVE
Diagnosis: REACTIVE

## 2022-02-11 ENCOUNTER — Ambulatory Visit: Payer: 59 | Admitting: Family Medicine

## 2022-02-14 ENCOUNTER — Ambulatory Visit (INDEPENDENT_AMBULATORY_CARE_PROVIDER_SITE_OTHER): Payer: 59 | Admitting: Family Medicine

## 2022-02-14 VITALS — BP 137/89 | HR 86 | Temp 98.3°F | Wt 206.0 lb

## 2022-02-14 DIAGNOSIS — R251 Tremor, unspecified: Secondary | ICD-10-CM | POA: Diagnosis not present

## 2022-02-14 DIAGNOSIS — G243 Spasmodic torticollis: Secondary | ICD-10-CM | POA: Insufficient documentation

## 2022-02-14 NOTE — Progress Notes (Signed)
Dawn Scott , 05/22/1970, 51 y.o., female MRN: [id number] Patient Care Team    Relationship Specialty Notifications Start End  Ma Hillock, DO PCP - General Family Medicine  06/28/15   Princess Bruins, MD Consulting Physician Obstetrics and Gynecology  06/28/15   Milus Banister, MD Attending Physician Gastroenterology  08/21/21   [name], [place] Social Worker Psychology  08/21/21     Chief Complaint  Patient presents with   Tremors    Head/neck     Subjective: Pt presents for an OV with complaints of head tremor of a few years in duration.  Associated symptoms include she reports she use to be the only one that noticed the tremor, but now others are also starting to notice. She reports her head jerks to the right. She denies hand tremor.  She reports if she is anxious or drink too much caffeine it will worsen her symptoms.      08/21/2021    3:24 PM 09/04/2020   11:07 AM 08/20/2020    9:20 AM 02/23/2019    1:01 PM 06/28/2015    2:17 PM  Depression screen PHQ 2/9  Decreased Interest 1 0 '1 3 1  '$ Down, Depressed, Hopeless 0 0 1 0 0  PHQ - 2 Score 1 0 '2 3 1  '$ Altered sleeping 1  1 0   Tired, decreased energy 2  3 0   Change in appetite 1  0 0   Feeling bad or failure about yourself  0  0 0   Trouble concentrating 1  2 0   Moving slowly or fidgety/restless 0  0 0   Suicidal thoughts 0  0 0   PHQ-9 Score '6  8 3   '$ Difficult doing work/chores    Not difficult at all     Allergies  Allergen Reactions   Augmentin [Amoxicillin-Pot Clavulanate] Other (See Comments)    Diarrhea C.diff positive.   Social History   Social History Narrative   Widower. Husband died on the job 08/07/11.   Bachelor of fine arts, currently not working    One child, Designer, jewellery.   No tobacco or drug use, rare use of alcohol.   Drinks caffeinated beverages, takes a daily vitamin   Wears her seatbelt, wears a bicycle, smoke detectors in the home   exercise at least 3 times a week.   Feels safe in  her relationships.   Past Medical History:  Diagnosis Date   Allergy    Anxiety    Bloody stool 07/07/2016   Clostridioides difficile infection    Depression    Hemangioma of skin and subcutaneous tissue 08/21/2021   History of ectopic pregnancy    04/ 2000/08/06  S/P  LEFT SALPINGECTOMY   History of endometriosis    History of neoplasm 08/21/2021   History of ovarian cyst    Hypertension    Left ovarian cyst    Menorrhagia with regular cycle    PONV (postoperative nausea and vomiting)    Shingles    Uterine fibroid    Past Surgical History:  Procedure Laterality Date   APPENDECTOMY     DILATATION & CURETTAGE/HYSTEROSCOPY WITH MYOSURE N/A 10/30/2016   Procedure: , REMOVAL IUD, HYSTEROSCOPY, DILATION AND CURETTAGE;  Surgeon: Princess Bruins, MD;  Location: [facility];  Service: Gynecology;  Laterality: N/A;   DILATION AND EVACUATION  03/10/2003   dr Dellis Filbert   retained placental tissue postpartum   DX LAPAROSCOPY/ LYSIS ADHESIONAS/  FULGERATION ENDOMETRIOSIS/  LEFT SALPINGECTOMY WITH REMOVAL ECOTPIC  08/18/2000   dr Ronita Hipps   HYSTEROSCOPY WITH NOVASURE N/A 10/30/2016   Procedure: HYSTEROSCOPY WITH NOVASURE;  Surgeon: Princess Bruins, MD;  Location: [facility];  Service: Gynecology;  Laterality: N/A;   LAPAROSCOPIC APPENDECTOMY  11/30/2007   SEPTOPLASTY  2011   TONSILLECTOMY AND ADENOIDECTOMY  child   WISDOM TOOTH EXTRACTION  1996   Family History  Problem Relation Age of Onset   Depression Mother    Arthritis Mother    Heart disease Mother    Transient ischemic attack Mother    Alcohol abuse Father    Depression Father    Diabetes Father    Alcohol abuse Maternal Grandfather    Leukemia Maternal Grandfather    Early death Maternal Grandfather        leukemia   Breast cancer Maternal Aunt 43   Early death Maternal Aunt        cancer   Depression Paternal Uncle    Transient ischemic attack Maternal Grandmother    Arthritis Paternal  Grandmother    Dementia Paternal Grandmother    Alcohol abuse Paternal Grandfather    Early death Paternal Grandfather    Colon cancer Neg Hx    Colon polyps Neg Hx    Esophageal cancer Neg Hx    Rectal cancer Neg Hx    Stomach cancer Neg Hx    Allergies as of 02/14/2022       Reactions   Augmentin [amoxicillin-pot Clavulanate] Other (See Comments)   Diarrhea C.diff positive.        Medication List        Accurate as of February 14, 2022  3:15 PM. If you have any questions, ask your nurse or doctor.          ALIGN PO Take by mouth.   amLODipine 2.5 MG tablet Commonly known as: NORVASC Take 1 tablet (2.5 mg total) by mouth daily.   amLODipine 2.5 MG tablet Commonly known as: NORVASC Take 1 tablet (2.5 mg total) by mouth daily.   B-12 PO Take 1 tablet by mouth every morning.   Biotin 1 MG Caps Take by mouth.   buPROPion 300 MG 24 hr tablet Commonly known as: WELLBUTRIN XL Take 1 tablet (300 mg total) by mouth daily.   DULoxetine 30 MG capsule Commonly known as: CYMBALTA Take 1 capsule (30 mg total) by mouth daily.   DULoxetine 60 MG capsule Commonly known as: CYMBALTA Take 1 capsule (60 mg total) by mouth daily.   fluticasone 50 MCG/ACT nasal spray Commonly known as: FLONASE Place into both nostrils.   folic acid 1 MG tablet Commonly known as: FOLVITE Take 1 mg by mouth daily.   lamoTRIgine 150 MG tablet Commonly known as: LAMICTAL Take 1 tablet (150 mg total) by mouth every evening.   loratadine 10 MG tablet Commonly known as: CLARITIN Take 10 mg by mouth every evening.   multivitamin with minerals tablet Take 1 tablet by mouth every evening.   Vitamin D3 50 MCG (2000 UT) Tabs Take 4,000 Units by mouth every morning.        All past medical history, surgical history, allergies, family history, immunizations andmedications were updated in the EMR today and reviewed under the history and medication portions of their EMR.      ROS Negative, with the exception of above mentioned in HPI   Objective:  BP 137/89   Pulse 86   Temp 98.3 F (36.8 C)  Wt 206 lb (93.4 kg)   SpO2 100%   BMI 30.42 kg/m  Body mass index is 30.42 kg/m. Physical Exam Vitals and nursing note reviewed.  Constitutional:      General: She is not in acute distress.    Appearance: Normal appearance. She is normal weight. She is not ill-appearing or toxic-appearing.  Eyes:     Extraocular Movements: Extraocular movements intact.     Conjunctiva/sclera: Conjunctivae normal.     Pupils: Pupils are equal, round, and reactive to light.  Neck:     Vascular: No carotid bruit.     Comments: Mild involuntary jerking to right side Musculoskeletal:     Cervical back: Rigidity (right SCM tightness) present. No tenderness.  Lymphadenopathy:     Cervical: No cervical adenopathy.  Neurological:     Mental Status: She is alert and oriented to person, place, and time. Mental status is at baseline.  Psychiatric:        Mood and Affect: Mood normal.        Behavior: Behavior normal.        Thought Content: Thought content normal.        Judgment: Judgment normal.      No results found. No results found. No results found for this or any previous visit (from the past 24 hour(s)).  Assessment/Plan: Dawn Scott is a 51 y.o. female present for OV for  Cervical dystonia/Tremor Discussed cervical dystonia and tremor with her today.  We will get a referral in to the neurologist she knows is in her network Ambulance person) - Ambulatory referral to Neurology  Reviewed expectations re: course of current medical issues. Discussed self-management of symptoms. Outlined signs and symptoms indicating need for more acute intervention. Patient verbalized understanding and all questions were answered. Patient received an After-Visit Summary.    No orders of the defined types were placed in this encounter.  No orders of the defined types were placed in  this encounter.  Referral Orders  No referral(s) requested today     Note is dictated utilizing voice recognition software. Although note has been proof read prior to signing, occasional typographical errors still can be missed. If any questions arise, please do not hesitate to call for verification.   electronically signed by:  Howard Pouch, DO  [facility]

## 2022-02-14 NOTE — Patient Instructions (Signed)
Referral placed for you today.

## 2022-03-10 ENCOUNTER — Encounter: Payer: Self-pay | Admitting: Family Medicine

## 2022-03-11 NOTE — Telephone Encounter (Signed)
No further action needed.

## 2022-05-08 ENCOUNTER — Ambulatory Visit: Payer: Self-pay

## 2022-05-09 ENCOUNTER — Ambulatory Visit: Payer: 59

## 2022-05-12 ENCOUNTER — Encounter: Payer: Self-pay | Admitting: Family Medicine

## 2022-05-12 ENCOUNTER — Ambulatory Visit (INDEPENDENT_AMBULATORY_CARE_PROVIDER_SITE_OTHER): Payer: 59 | Admitting: Family Medicine

## 2022-05-12 VITALS — BP 133/83 | HR 88 | Temp 98.4°F | Ht 69.0 in | Wt 206.0 lb

## 2022-05-12 DIAGNOSIS — I10 Essential (primary) hypertension: Secondary | ICD-10-CM | POA: Diagnosis not present

## 2022-05-12 DIAGNOSIS — Z8249 Family history of ischemic heart disease and other diseases of the circulatory system: Secondary | ICD-10-CM | POA: Diagnosis not present

## 2022-05-12 MED ORDER — AMLODIPINE BESYLATE 5 MG PO TABS: 5.0000 mg | ORAL_TABLET | Freq: Every day | ORAL | 1 refills | Status: DC

## 2022-05-12 NOTE — Progress Notes (Signed)
Patient ID: Dawn Scott, female  DOB: Nov 16, 1970, 52 y.o.   MRN: [id number] Patient Care Team    Relationship Specialty Notifications Start End  Ma Hillock, DO PCP - General Family Medicine  06/28/15   Princess Bruins, MD Consulting Physician Obstetrics and Gynecology  06/28/15   Milus Banister, MD Attending Physician Gastroenterology  08/21/21   [name], [place] Social Worker Psychology  08/21/21     Chief Complaint  Patient presents with   Hypertension    [place]; pt is not fasting    Subjective: Dawn Scott is a 52 y.o.  Female  present for [facility] All past medical history, surgical history, allergies, family history, immunizations, medications and social history were updated in the electronic medical record today. All recent labs, ED visits and hospitalizations within the last year were reviewed.  HTN/obesity/Fhx HD-mother: Pt reports compliance with amlodipine 2.5 mg QD. She reports she saw her ophthalmologist, which noted some HTN changes on her left eye.  Patient denies chest pain, shortness of breath, dizziness or lower extremity edema.  Pt does not daily baby ASA. Pt is not prescribed statin. RF: htn, obesity, FHX HD mother.       05/12/2022    1:16 PM 08/21/2021    3:24 PM 09/04/2020   11:07 AM 08/20/2020    9:20 AM 02/23/2019    1:01 PM  Depression screen PHQ 2/9  Decreased Interest 1 1 0 1 3  Down, Depressed, Hopeless 1 0 0 1 0  PHQ - 2 Score 2 1 0 2 3  Altered sleeping 0 1  1 0  Tired, decreased energy 0 2  3 0  Change in appetite 0 1  0 0  Feeling bad or failure about yourself  0 0  0 0  Trouble concentrating '2 1  2 '$ 0  Moving slowly or fidgety/restless 0 0  0 0  Suicidal thoughts 0 0  0 0  PHQ-9 Score '4 6  8 3  '$ Difficult doing work/chores     Not difficult at all      05/12/2022    1:16 PM 08/21/2021    3:24 PM 08/20/2020    9:20 AM 06/28/2015    2:17 PM  GAD 7 : Generalized Anxiety Score  Nervous, Anxious, on Edge 0 1 0 0  Control/stop  worrying 0 0 0 0  Worry too much - different things 0 0 0 0  Trouble relaxing 0 0 0 0  Restless 0 0 0   Easily annoyed or irritable '1 2 1 '$ 0  Afraid - awful might happen 0 0 0 0  Total GAD 7 Score '1 3 1     '$ Immunization History  Administered Date(s) Administered   Influenza,inj,Quad PF,6+ Mos 02/04/2013, 01/18/2014, 01/22/2015, 02/04/2022   Influenza-Unspecified 02/11/2021   PFIZER(Purple Top)SARS-COV-2 Vaccination 07/14/2019, 08/08/2019, 05/04/2020   Pfizer Covid-19 Vaccine Bivalent Booster 33yr & up 02/01/2021   Tdap 11/24/2014   Unspecified SARS-COV-2 Vaccination 02/04/2022   Zoster Recombinat (Shingrix) 03/28/2022   Past Medical History:  Diagnosis Date   Allergy    Anxiety    Bloody stool 07/07/2016   Clostridioides difficile infection    Depression    Hemangioma of skin and subcutaneous tissue 08/21/2021   History of ectopic pregnancy    04/ 2002  S/P  LEFT SALPINGECTOMY   History of endometriosis    History of neoplasm 08/21/2021   History of ovarian cyst    Hypertension  Left ovarian cyst    Menorrhagia with regular cycle    PONV (postoperative nausea and vomiting)    Shingles    Uterine fibroid    Allergies  Allergen Reactions   Augmentin [Amoxicillin-Pot Clavulanate] Other (See Comments)    Diarrhea C.diff positive.   Past Surgical History:  Procedure Laterality Date   APPENDECTOMY     DILATATION & CURETTAGE/HYSTEROSCOPY WITH MYOSURE N/A 10/30/2016   Procedure: , REMOVAL IUD, HYSTEROSCOPY, DILATION AND CURETTAGE;  Surgeon: Princess Bruins, MD;  Location: [place];  Service: Gynecology;  Laterality: N/A;   DILATION AND EVACUATION  03/10/2003   dr Dellis Filbert   retained placental tissue postpartum   DX LAPAROSCOPY/ LYSIS ADHESIONAS/  FULGERATION ENDOMETRIOSIS/  LEFT SALPINGECTOMY WITH REMOVAL ECOTPIC  08/18/2000   dr Ronita Hipps   HYSTEROSCOPY WITH NOVASURE N/A 10/30/2016   Procedure: HYSTEROSCOPY WITH NOVASURE;  Surgeon: Princess Bruins, MD;   Location: [place];  Service: Gynecology;  Laterality: N/A;   LAPAROSCOPIC APPENDECTOMY  11/30/2007   SEPTOPLASTY  07-27-09   TONSILLECTOMY AND ADENOIDECTOMY  child   WISDOM TOOTH EXTRACTION  1996   Family History  Problem Relation Age of Onset   Depression Mother    Arthritis Mother    Heart disease Mother    Transient ischemic attack Mother    Alcohol abuse Father    Depression Father    Diabetes Father    Alcohol abuse Maternal Grandfather    Leukemia Maternal Grandfather    Early death Maternal Grandfather        leukemia   Breast cancer Maternal Aunt 38   Early death Maternal Aunt        cancer   Depression Paternal Uncle    Transient ischemic attack Maternal Grandmother    Arthritis Paternal Grandmother    Dementia Paternal Grandmother    Alcohol abuse Paternal Grandfather    Early death Paternal Grandfather    Colon cancer Neg Hx    Colon polyps Neg Hx    Esophageal cancer Neg Hx    Rectal cancer Neg Hx    Stomach cancer Neg Hx    Social History   Social History Narrative   Widower. Husband died on the job 07-28-2011.   Bachelor of fine arts, currently not working    One child, Designer, jewellery.   No tobacco or drug use, rare use of alcohol.   Drinks caffeinated beverages, takes a daily vitamin   Wears her seatbelt, wears a bicycle, smoke detectors in the home   exercise at least 3 times a week.   Feels safe in her relationships.    Allergies as of 05/12/2022       Reactions   Augmentin [amoxicillin-pot Clavulanate] Other (See Comments)   Diarrhea C.diff positive.        Medication List        Accurate as of May 12, 2022  1:27 PM. If you have any questions, ask your nurse or doctor.          ALIGN PO Take by mouth.   amLODipine 5 MG tablet Commonly known as: NORVASC Take 1 tablet (5 mg total) by mouth daily. What changed:  medication strength how much to take Another medication with the same name was removed. Continue taking this  medication, and follow the directions you see here. Changed by: Howard Pouch, DO   B-12 PO Take 1 tablet by mouth every morning.   Biotin 1 MG Caps Take by mouth.   buPROPion 300 MG  24 hr tablet Commonly known as: WELLBUTRIN XL Take 1 tablet (300 mg total) by mouth daily.   DULoxetine 30 MG capsule Commonly known as: CYMBALTA Take 1 capsule (30 mg total) by mouth daily.   DULoxetine 60 MG capsule Commonly known as: CYMBALTA Take 1 capsule (60 mg total) by mouth daily.   fluticasone 50 MCG/ACT nasal spray Commonly known as: FLONASE Place into both nostrils.   folic acid 1 MG tablet Commonly known as: FOLVITE Take 1 mg by mouth daily.   lamoTRIgine 150 MG tablet Commonly known as: LAMICTAL Take 1 tablet (150 mg total) by mouth every evening.   loratadine 10 MG tablet Commonly known as: CLARITIN Take 10 mg by mouth every evening.   multivitamin with minerals tablet Take 1 tablet by mouth every evening.   Vitamin D3 50 MCG (2000 UT) Tabs Take 4,000 Units by mouth every morning.        All past medical history, surgical history, allergies, family history, immunizations andmedications were updated in the EMR today and reviewed under the history and medication portions of their EMR.     No results found for this or any previous visit (from the past 2160 hour(s)).  No results found.  ROS 14 pt review of systems performed and negative (unless mentioned in an HPI)  Objective: BP 133/83   Pulse 88   Temp 98.4 F (36.9 C) (Oral)   Ht '5\' 9"'$  (1.753 m)   Wt 206 lb (93.4 kg)   SpO2 99%   BMI 30.42 kg/m  Physical Exam Vitals and nursing note reviewed.  Constitutional:      General: She is not in acute distress.    Appearance: Normal appearance. She is not ill-appearing, toxic-appearing or diaphoretic.  HENT:     Head: Normocephalic and atraumatic.  Eyes:     General: No scleral icterus.       Right eye: No discharge.        Left eye: No discharge.      Extraocular Movements: Extraocular movements intact.     Conjunctiva/sclera: Conjunctivae normal.     Pupils: Pupils are equal, round, and reactive to light.  Cardiovascular:     Rate and Rhythm: Normal rate and regular rhythm.  Pulmonary:     Effort: Pulmonary effort is normal. No respiratory distress.     Breath sounds: Normal breath sounds. No wheezing, rhonchi or rales.  Musculoskeletal:     Right lower leg: No edema.     Left lower leg: No edema.  Skin:    General: Skin is warm.     Findings: No rash.  Neurological:     Mental Status: She is alert and oriented to person, place, and time. Mental status is at baseline.     Motor: No weakness.     Gait: Gait normal.  Psychiatric:        Mood and Affect: Mood normal.        Behavior: Behavior normal.        Thought Content: Thought content normal.        Judgment: Judgment normal.    No results found.  Assessment/plan: TIFFINIE CAILLIER is a 52 y.o. female present for [facility] HTN/obesity/Fhx HD-mother/overweight: Stable increase amlodipine 5 mg qd Tried: HCTZ worsened hyponatremia Consider statin and/or cardiac CT for screen.   Hyponatremia: Still mildly hyponatremic, likely to her mental health medications.  It did greatly improve once HCTZ was removed. She is stable.   Return in about 24 weeks (  around 10/27/2022) for cpe (20 min), Routine chronic condition follow-up.   No orders of the defined types were placed in this encounter.  Meds ordered this encounter  Medications   amLODipine (NORVASC) 5 MG tablet    Sig: Take 1 tablet (5 mg total) by mouth daily.    Dispense:  90 tablet    Refill:  1    .   Referral Orders  No referral(s) requested today    Electronically signed by: Howard Pouch, [place]

## 2022-05-12 NOTE — Patient Instructions (Addendum)
Return in about 24 weeks (around 10/27/2022) for cpe (20 min), Routine chronic condition follow-up.        Great to see you today.  I have refilled the medication(s) we provide.   If labs were collected, we will inform you of lab results once received either by echart message or telephone call.   - echart message- for normal results that have been seen by the patient already.   - telephone call: abnormal results or if patient has not viewed results in their echart.

## 2022-06-05 ENCOUNTER — Encounter: Payer: Self-pay | Admitting: Neurology

## 2022-06-19 ENCOUNTER — Other Ambulatory Visit: Payer: Self-pay | Admitting: Physician Assistant

## 2022-06-22 NOTE — Telephone Encounter (Signed)
Please call to schedule an appt, due this month.

## 2022-06-27 NOTE — Telephone Encounter (Signed)
LM for pt that she needs an appt if TH is still her provider

## 2022-07-03 ENCOUNTER — Ambulatory Visit (INDEPENDENT_AMBULATORY_CARE_PROVIDER_SITE_OTHER): Payer: 59 | Admitting: Physician Assistant

## 2022-07-03 ENCOUNTER — Encounter: Payer: Self-pay | Admitting: Physician Assistant

## 2022-07-03 DIAGNOSIS — F3342 Major depressive disorder, recurrent, in full remission: Secondary | ICD-10-CM | POA: Diagnosis not present

## 2022-07-03 DIAGNOSIS — F411 Generalized anxiety disorder: Secondary | ICD-10-CM | POA: Diagnosis not present

## 2022-07-03 MED ORDER — BUPROPION HCL ER (XL) 300 MG PO TB24
300.0000 mg | ORAL_TABLET | Freq: Every day | ORAL | 1 refills | Status: DC
Start: 2022-07-03 — End: 2022-12-03

## 2022-07-03 NOTE — Progress Notes (Signed)
[facility] Med Check  Patient ID: Dawn Scott,  MRN: [id number]  PCP: Ma Hillock, DO  Date of Evaluation: 07/03/2022 Time spent:20 minutes  Chief Complaint:  Chief Complaint   Anxiety; Depression; Follow-up    HISTORY/CURRENT STATUS: HPI For routine 75-monthmed check.    Doing well. Patient is able to enjoy things.  Energy and motivation are good.  Work is going well, sometimes stressful but 'ok.'  No extreme sadness, tearfulness, or feelings of hopelessness.  Sleeps well most of the time. ADLs and personal hygiene are normal.   Denies any changes in concentration, making decisions, or remembering things.  Appetite has not changed.  Weight is stable. No PA, not having a lot of anxiety in general either.  Denies suicidal or homicidal thoughts.  Patient denies increased energy with decreased need for sleep, increased talkativeness, racing thoughts, impulsivity or risky behaviors, increased spending, increased libido, grandiosity, increased irritability or anger, paranoia, or hallucinations.  Denies dizziness, syncope, seizures, numbness, tingling, tremor, tics, unsteady gait, slurred speech, confusion. Denies muscle or joint pain, stiffness, or dystonia.  Individual Medical History/ Review of Systems: Changes? :No     Past medications for mental health diagnoses include: Klonopin, Prozac, Prosom, Ambien, Cymbalta, Wellbutrin, Lamictal   Allergies: Augmentin [amoxicillin-pot clavulanate]  Current Medications:  Current Outpatient Medications:    amLODipine (NORVASC) 5 MG tablet, Take 1 tablet (5 mg total) by mouth daily., Disp: 90 tablet, Rfl: 1   Biotin 1 MG CAPS, Take by mouth., Disp: , Rfl:    Cholecalciferol (VITAMIN D3) 50 MCG (2000 UT) TABS, Take 4,000 Units by mouth every morning., Disp: 60 tablet, Rfl: 11   Cyanocobalamin (B-12 PO), Take 1 tablet by mouth every morning. , Disp: , Rfl:    DULoxetine (CYMBALTA) 30 MG capsule, Take 1 capsule (30 mg total) by mouth  daily., Disp: 90 capsule, Rfl: 3   DULoxetine (CYMBALTA) 60 MG capsule, Take 1 capsule (60 mg total) by mouth daily., Disp: 90 capsule, Rfl: 3   folic acid (FOLVITE) 1 MG tablet, Take 1 mg by mouth daily., Disp: , Rfl:    lamoTRIgine (LAMICTAL) 150 MG tablet, Take 1 tablet (150 mg total) by mouth every evening., Disp: 90 tablet, Rfl: 3   loratadine (CLARITIN) 10 MG tablet, Take 10 mg by mouth every evening. , Disp: , Rfl:    Multiple Vitamins-Minerals (MULTIVITAMIN WITH MINERALS) tablet, Take 1 tablet by mouth every evening. , Disp: , Rfl:    Probiotic Product (ALIGN PO), Take by mouth., Disp: , Rfl:    buPROPion (WELLBUTRIN XL) 300 MG 24 hr tablet, Take 1 tablet (300 mg total) by mouth daily., Disp: 90 tablet, Rfl: 1 Medication Side Effects: none  Family Medical/ Social History: Changes?  no  MENTAL HEALTH EXAM:  There were no vitals taken for this visit.There is no height or weight on file to calculate BMI.  General Appearance: Casual, Neat and Well Groomed  Eye Contact:  Good  Speech:  Clear and Coherent and Normal Rate  Volume:  Normal  Mood:  Euthymic  Affect:  Appropriate  Thought Process:  Goal Directed and Descriptions of Associations: Circumstantial  Orientation:  Full (Time, Place, and Person)  Thought Content: Logical   Suicidal Thoughts:  No  Homicidal Thoughts:  No  Memory:  WNL  Judgement:  Good  Insight:  Good  Psychomotor Activity:  Normal  Concentration:  Concentration: Good and Attention Span: Good  Recall:  Good  Fund of Knowledge: Good  Language: Good  Assets:  Desire for Improvement Financial Resources/Insurance Housing Transportation Vocational/Educational  ADL's:  Intact  Cognition: WNL  Prognosis:  Good   DIAGNOSES:    ICD-10-CM   1. Recurrent major depressive disorder, in full remission ([place])  F33.42     2. Generalized anxiety disorder  F41.1      Receiving Psychotherapy: No    RECOMMENDATIONS:  PDMP was reviewed.  No controlled  substances.  I provided 20 minutes of face to face time during this encounter, including time spent before and after the visit in records review, medical decision making, counseling pertinent to today's visit, and charting.   She's doing well so no changes are needed.  Continue Wellbutrin XL 300 mg, 1 p.o. every morning. Continue Cymbalta 30 mg +60 mg daily. Continue Lamictal 150 mg daily. Continue multivitamin, vitamin D, B12, and biotin. Return in 6 months.  Donnal Moat, PA-C

## 2022-07-21 NOTE — Progress Notes (Unsigned)
Assessment/Plan:   1.  Cervical Dystonia  -I talked to the patient about the nature and pathophysiology.  The patient is having trouble with ADL's and with rotation of the head in daily life for driving.  The primary muscles involved are the ***.  We talked about treatments.  We talked about the value of botox.  The patient was educated on the botulinum toxin the black blox warning and given a copy of the botox patient medication guide.  The patient understands that this warning states that there have been reported cases of the Botox extending beyond the injection site and creating adverse effects, similar to those of botulism. This included loss of strength, trouble walking, hoarseness, trouble saying words clearly, loss of bladder control, trouble breathing, trouble swallowing, diplopia, blurry vision and ptosis. Most of the distant spread of Botox was happening in patients, primarily children, who received medication for spasticity or for cervical dystonia. The patient expressed understanding and desire to proceed.  I will try to get authorization.    Subjective:   Dawn Scott was seen in consultation in the movement disorder clinic at the request of Melburn Popper, *.  The evaluation is for tremor.  Patient first saw Melburn Popper, * who felt patient likely had cervical dystonia.  She was referred here for further evaluation and treatment.  Tremor started approximately 2+ ago and involves the head/neck.  Head seems to pull to the right.  She does not have associated hand or leg tremor.  Tremor is most noticeable when ***.   There is *** family hx of tremor.    Affected by caffeine:  {yes no:314532} Affected by alcohol:  {yes no:314532} Affected by stress:  {yes no:314532} Affected by fatigue:  {yes no:314532} Spills soup if on spoon:  {yes no:314532} Spills glass of liquid if full:  {yes no:314532} Affects ADL's (tying shoes, brushing teeth, etc):  {yes  no:314532}  Current/Previously tried tremor medications: ***  Current medications that may exacerbate tremor:  ***  Outside reports reviewed: {Outside review:15817}.  Allergies  Allergen Reactions   Augmentin [Amoxicillin-Pot Clavulanate] Other (See Comments)    Diarrhea C.diff positive.    No outpatient medications have been marked as taking for the 07/22/22 encounter (Appointment) with Ariannie Penaloza, Eustace Quail, DO.      Objective:   VITALS:  There were no vitals filed for this visit. Gen:  Appears stated age and in NAD. HEENT:  Normocephalic, atraumatic. The mucous membranes are moist. The superficial temporal arteries are without ropiness or tenderness. Cardiovascular: Regular rate and rhythm. Lungs: Clear to auscultation bilaterally. Neck: There are no carotid bruits noted bilaterally.  NEUROLOGICAL:  Orientation:  The patient is alert and oriented x 3.   Cranial nerves: There is good facial symmetry. Extraocular muscles are intact and visual fields are full to confrontational testing. Speech is fluent and clear. Soft palate rises symmetrically and there is no tongue deviation. Hearing is intact to conversational tone. Tone: Tone is good throughout. Sensation: Sensation is intact to light touch touch throughout (facial, trunk, extremities). Vibration is intact at the bilateral big toe. There is no extinction with double simultaneous stimulation. There is no sensory dermatomal level identified. Coordination:  The patient has no dysdiadichokinesia or dysmetria. Motor: Strength is 5/5 in the bilateral upper and lower extremities.  Shoulder shrug is equal bilaterally.  There is no pronator drift.  There are no fasciculations noted. DTR's: Deep tendon reflexes are 2/4 at the bilateral biceps, triceps, brachioradialis, patella and  achilles.  Plantar responses are downgoing bilaterally. Gait and Station: The patient is able to ambulate without difficulty. The patient is able to heel toe walk  without any difficulty. The patient is able to ambulate in a tandem fashion. The patient is able to stand in the Romberg position.   MOVEMENT EXAM: Tremor:  There is *** tremor in the UE, noted most significantly with action.  The patient is *** able to draw Archimedes spirals without significant difficulty.  There is *** tremor at rest.  The patient is *** able to pour water from one glass to another without spilling it.  I have reviewed and interpreted the following labs independently   Chemistry      Component Value Date/Time   NA 133 (L) 11/25/2021 1313   K 4.3 11/25/2021 1313   CL 96 11/25/2021 1313   CO2 28 11/25/2021 1313   BUN 11 11/25/2021 1313   CREATININE 0.74 11/25/2021 1313   CREATININE 0.82 08/21/2021 1519      Component Value Date/Time   CALCIUM 9.7 11/25/2021 1313   ALKPHOS 46 08/20/2020 0943   AST 16 08/21/2021 1519   ALT 8 08/21/2021 1519   BILITOT 0.3 08/21/2021 1519      Lab Results  Component Value Date   WBC 5.8 08/21/2021   HGB 13.4 08/21/2021   HCT 38.9 08/21/2021   MCV 88.0 08/21/2021   PLT 328 08/21/2021   Lab Results  Component Value Date   TSH 1.88 08/21/2021      Total time spent on today's visit was ***60 minutes, including both face-to-face time and nonface-to-face time.  Time included that spent on review of records (prior notes available to me/labs/imaging if pertinent), discussing treatment and goals, answering patient's questions and coordinating care.  CC:  Kuneff, Renee A, DO

## 2022-07-22 ENCOUNTER — Ambulatory Visit (INDEPENDENT_AMBULATORY_CARE_PROVIDER_SITE_OTHER): Payer: 59 | Admitting: Neurology

## 2022-07-22 ENCOUNTER — Encounter: Payer: Self-pay | Admitting: Neurology

## 2022-07-22 VITALS — BP 140/90 | HR 73 | Ht 69.0 in | Wt 200.0 lb

## 2022-07-22 DIAGNOSIS — G243 Spasmodic torticollis: Secondary | ICD-10-CM | POA: Diagnosis not present

## 2022-07-28 ENCOUNTER — Telehealth: Payer: Self-pay | Admitting: Pharmacy Technician

## 2022-08-14 ENCOUNTER — Other Ambulatory Visit (HOSPITAL_COMMUNITY): Payer: Self-pay

## 2022-08-14 ENCOUNTER — Telehealth: Payer: Self-pay | Admitting: Pharmacy Technician

## 2022-08-14 NOTE — Telephone Encounter (Signed)
PA has been submitted.

## 2022-08-14 NOTE — Telephone Encounter (Signed)
[employer] verification has been submitted. Benefit Verification:  [id number]   Pharmacy PA has been submitted for BOTOX 400 via CMM. INSURANCE: [employer] [facility]) DATE SUBMITTED: 4.18.24 KEY: [id number] Status is pending

## 2022-08-15 ENCOUNTER — Telehealth: Payer: Self-pay

## 2022-08-15 ENCOUNTER — Telehealth: Payer: Self-pay | Admitting: Anesthesiology

## 2022-08-15 NOTE — Telephone Encounter (Signed)
Pt left message with AN stating she would like to get the price of Botox for self pay, her insurance wont cover it.

## 2022-08-15 NOTE — Telephone Encounter (Signed)
Called and talked to pt told her I would check on the appeal.

## 2022-08-18 ENCOUNTER — Other Ambulatory Visit (HOSPITAL_COMMUNITY): Payer: Self-pay

## 2022-08-18 NOTE — Telephone Encounter (Signed)
Patient Advocate Encounter  Received notification that the request for prior authorization for BOTOX 400  has been denied due to not being covered through Pharmacy Benefit.    Per [employer] will cover Buy and Bill through [facility]      Roland Earl, CPhT Pharmacy Patient Advocate Specialist [facility] Patient Advocate Team Direct Number: [phone]  Fax: [phone]

## 2022-08-20 NOTE — Telephone Encounter (Signed)
Please see other telephone encounter. Denial letter is scanned in to chart. Pt must do Buy and Bill. Can process through [employer] [facility] or [facility] as Arnoldo Morale and Garibaldi.

## 2022-08-21 NOTE — Telephone Encounter (Signed)
Please review , This patient is only able to do buy and bill. I have so many like this lately. Do you want to continue or deny patient Botox. This is the patient that sent [place] the card in the breakroom

## 2022-08-22 ENCOUNTER — Other Ambulatory Visit (HOSPITAL_COMMUNITY): Payer: Self-pay

## 2022-08-26 ENCOUNTER — Other Ambulatory Visit (HOSPITAL_COMMUNITY): Payer: Self-pay

## 2022-08-26 ENCOUNTER — Telehealth: Payer: Self-pay | Admitting: Pharmacy Technician

## 2022-08-26 NOTE — Telephone Encounter (Signed)
Patient Advocate Encounter  Received notification from [employer] that prior authorization for XEOMIN 200U is required.   PA submitted on 4.30.24 Key [id number] Status is pending

## 2022-08-26 NOTE — Telephone Encounter (Signed)
Pharmacy Patient Advocate Encounter  Received notification from [employer] that the request for prior authorization for XEOMIN has been denied due to MEDICATION AND/OR DIAGNOSIS ARE NOT A COVERED BENEFIT.    Please be advised we currently do not have a Pharmacist to review denials, therefore you will need to process appeals accordingly as needed. Thanks for your support at this time.   You may call [phone] to appeal.

## 2022-08-29 NOTE — Telephone Encounter (Signed)
Pt is sch for 09-19-22 with Tat for Botox

## 2022-08-29 NOTE — Telephone Encounter (Signed)
Calling patient after making final verification that she can do buy and bill for botox

## 2022-08-29 NOTE — Telephone Encounter (Signed)
I left a Voicemail for the patient to call back to sch for the Botox (Buy and Bill)

## 2022-09-02 ENCOUNTER — Encounter: Payer: Self-pay | Admitting: Obstetrics & Gynecology

## 2022-09-19 ENCOUNTER — Encounter: Payer: Self-pay | Admitting: Neurology

## 2022-09-19 ENCOUNTER — Ambulatory Visit (INDEPENDENT_AMBULATORY_CARE_PROVIDER_SITE_OTHER): Payer: 59 | Admitting: Neurology

## 2022-09-19 DIAGNOSIS — G243 Spasmodic torticollis: Secondary | ICD-10-CM

## 2022-09-19 MED ORDER — ONABOTULINUMTOXINA 100 UNITS IJ SOLR
300.0000 [IU] | Freq: Once | INTRAMUSCULAR | Status: AC
  Administered 2022-09-19: 245 [IU] via INTRAMUSCULAR

## 2022-09-19 NOTE — Procedures (Signed)
[facility]   Procedure Note Botox  Attending: Dr. Lurena Joiner Minerva Bluett  Preoperative Diagnosis(es): Cervical Dystonia  Result History  Onset of effect: n/a  Duration of Benefit: n/ a Adverse Effects: n/a   Consent obtained from: The patient Benefits discussed included, but were not limited to decreased muscle tightness, increased joint range of motion, and decreased pain.  Risk discussed included, but were not limited pain and discomfort, bleeding, bruising, excessive weakness, venous thrombosis, muscle atrophy and dysphagia.  A copy of the patient medication guide was given to the patient which explains the blackbox warning.  Patients identity and treatment sites confirmed Yes.  .  Details of Procedure: Skin was cleaned with alcohol.  A 30 gauge, 25mm  needle was introduced to the target muscle, except for posterior splenius where 27 gauge, 1.5 inch needle used.   Prior to injection, the needle plunger was aspirated to make sure the needle was not within a blood vessel.  There was no blood retrieved on aspiration.    Following is a summary of the muscles injected  And the amount of Botulinum toxin used:   Dilution 0.9% preservative free saline mixed with 100 u Botox type A to make 10 U per 0.1cc  Injections  Location Left  Right Units Number of sites        Sternocleidomastoid  60 60 1  Splenius Capitus, posterior approach 80  80 1  Splenius Capitus, lateral approach 30  30 1   Levator Scapulae 15  15 1   Trapezius 20 x 3  60 3        TOTAL UNITS:   245    Agent: Botulinum Type A ( Onobotulinum Toxin type A ).  2 vials of Botox were used, each containing 100 units and freshly diluted with 1 mL of sterile, non-preserved saline   Total injected (Units): 245  Total wasted (Units): 55   Pt tolerated procedure well without complications.   Reinjection is anticipated in 3 months.

## 2022-09-22 ENCOUNTER — Other Ambulatory Visit: Payer: Self-pay | Admitting: Family Medicine

## 2022-10-21 ENCOUNTER — Other Ambulatory Visit: Payer: Self-pay | Admitting: Physician Assistant

## 2022-10-27 ENCOUNTER — Encounter: Payer: 59 | Admitting: Family Medicine

## 2022-10-31 ENCOUNTER — Encounter: Payer: Self-pay | Admitting: Family Medicine

## 2022-10-31 ENCOUNTER — Ambulatory Visit (INDEPENDENT_AMBULATORY_CARE_PROVIDER_SITE_OTHER): Payer: 59 | Admitting: Family Medicine

## 2022-10-31 VITALS — BP 122/88 | HR 79 | Temp 97.9°F | Ht 69.69 in | Wt 192.6 lb

## 2022-10-31 DIAGNOSIS — I10 Essential (primary) hypertension: Secondary | ICD-10-CM

## 2022-10-31 DIAGNOSIS — Z0000 Encounter for general adult medical examination without abnormal findings: Secondary | ICD-10-CM

## 2022-10-31 DIAGNOSIS — E559 Vitamin D deficiency, unspecified: Secondary | ICD-10-CM | POA: Diagnosis not present

## 2022-10-31 DIAGNOSIS — Z8249 Family history of ischemic heart disease and other diseases of the circulatory system: Secondary | ICD-10-CM

## 2022-10-31 DIAGNOSIS — Z131 Encounter for screening for diabetes mellitus: Secondary | ICD-10-CM

## 2022-10-31 LAB — CBC WITH DIFFERENTIAL/PLATELET
Basophils Absolute: 0.1 10*3/uL (ref 0.0–0.1)
Basophils Relative: 1.2 % (ref 0.0–3.0)
Eosinophils Absolute: 0.1 10*3/uL (ref 0.0–0.7)
Eosinophils Relative: 2 % (ref 0.0–5.0)
HCT: 39.2 % (ref 36.0–46.0)
Hemoglobin: 13.1 g/dL (ref 12.0–15.0)
Lymphocytes Relative: 32.1 % (ref 12.0–46.0)
Lymphs Abs: 2 10*3/uL (ref 0.7–4.0)
MCHC: 33.4 g/dL (ref 30.0–36.0)
MCV: 87.8 fl (ref 78.0–100.0)
Monocytes Absolute: 0.4 10*3/uL (ref 0.1–1.0)
Monocytes Relative: 6.8 % (ref 3.0–12.0)
Neutro Abs: 3.6 10*3/uL (ref 1.4–7.7)
Neutrophils Relative %: 57.9 % (ref 43.0–77.0)
Platelets: 333 10*3/uL (ref 150.0–400.0)
RBC: 4.46 Mil/uL (ref 3.87–5.11)
RDW: 13.4 % (ref 11.5–15.5)
WBC: 6.2 10*3/uL (ref 4.0–10.5)

## 2022-10-31 LAB — COMPREHENSIVE METABOLIC PANEL
ALT: 8 U/L (ref 0–35)
AST: 16 U/L (ref 0–37)
Albumin: 4.5 g/dL (ref 3.5–5.2)
Alkaline Phosphatase: 52 U/L (ref 39–117)
BUN: 10 mg/dL (ref 6–23)
CO2: 27 mEq/L (ref 19–32)
Calcium: 9.8 mg/dL (ref 8.4–10.5)
Chloride: 94 mEq/L — ABNORMAL LOW (ref 96–112)
Creatinine, Ser: 0.83 mg/dL (ref 0.40–1.20)
GFR: 81.97 mL/min (ref >60.00)
Glucose, Bld: 86 mg/dL (ref 70–99)
Potassium: 3.9 mEq/L (ref 3.5–5.1)
Sodium: 131 mEq/L — ABNORMAL LOW (ref 135–145)
Total Bilirubin: 0.4 mg/dL (ref 0.2–1.2)
Total Protein: 7 g/dL (ref 6.0–8.3)

## 2022-10-31 LAB — VITAMIN D 25 HYDROXY (VIT D DEFICIENCY, FRACTURES): VITD: 51.47 ng/mL (ref 30.00–100.00)

## 2022-10-31 LAB — LIPID PANEL
Cholesterol: 215 mg/dL — ABNORMAL HIGH (ref 0–200)
HDL: 64 mg/dL (ref >39.00)
LDL Cholesterol: 124 mg/dL — ABNORMAL HIGH (ref 0–99)
NonHDL: 151.01
Total CHOL/HDL Ratio: 3
Triglycerides: 135 mg/dL (ref 0.0–149.0)
VLDL: 27 mg/dL (ref 0.0–40.0)

## 2022-10-31 LAB — TSH: TSH: 2.78 u[IU]/mL (ref 0.35–5.50)

## 2022-10-31 LAB — HEMOGLOBIN A1C: Hgb A1c MFr Bld: 5.3 % (ref 4.6–6.5)

## 2022-10-31 MED ORDER — AMLODIPINE BESYLATE 5 MG PO TABS: 5.0000 mg | ORAL_TABLET | Freq: Every day | ORAL | 1 refills | Status: DC

## 2022-10-31 NOTE — Progress Notes (Signed)
Patient ID: Dawn Scott, female  DOB: [birth year], 52 y.o.   MRN: [id number] Patient Care Team    Relationship Specialty Notifications Start End  Natalia Leatherwood, DO PCP - General Family Medicine  06/28/15   Genia Del, MD Consulting Physician Obstetrics and Gynecology  06/28/15   Rachael Fee, MD Attending Physician Gastroenterology  08/21/21   Delight Ovens, LCSW Social Worker Psychology  08/21/21     Chief Complaint  Patient presents with   Annual Exam    Pt is not fasting    Subjective: Dawn Scott is a 52 y.o.  Female  present for CPE and Chronic Conditions/illness Management  All past medical history, surgical history, allergies, family history, immunizations, medications and social history were updated in the electronic medical record today. All recent labs, ED visits and hospitalizations within the last year were reviewed.  Health maintenance:  Colonoscopy: no fhx. Dr. Christella Hartigan 02/04/2021> 10 yr f/u Mammogram: completed: 08/2022-gyn Cervical cancer screening: last pap: 2023,  Dr. Seymour Bars. Immunizations: tdap UTD 2016, Influenza UTD (encouraged yearly)shingrix completed  Infectious disease screening: HIV screen completed- Hep C completed DEXA: routine screen Assistive device: none Oxygen NWG:NFAO Patient has a Dental home. Hospitalizations/ED visits: reviewed  HTN/obesity/Fhx HD-mother: Pt reports compliance  with amlodipine 5 mg QD. Blood pressures ranges at home normal. Patient denies chest pain, shortness of breath, dizziness or lower extremity edema.    Pt does not daily baby ASA. Pt is not prescribed statin. RF: htn, obesity, FHX HD mother.        05/12/2022    1:16 PM 08/21/2021    3:24 PM 09/04/2020   11:07 AM 08/20/2020    9:20 AM 02/23/2019    1:01 PM  Depression screen PHQ 2/9  Decreased Interest 1 1 0 1 3  Down, Depressed, Hopeless 1 0 0 1 0  PHQ - 2 Score 2 1 0 2 3  Altered sleeping 0 1  1 0  Tired, decreased energy 0 2  3 0  Change in  appetite 0 1  0 0  Feeling bad or failure about yourself  0 0  0 0  Trouble concentrating 2 1  2  0  Moving slowly or fidgety/restless 0 0  0 0  Suicidal thoughts 0 0  0 0  PHQ-9 Score 4 6  8 3   Difficult doing work/chores     Not difficult at all      05/12/2022    1:16 PM 08/21/2021    3:24 PM 08/20/2020    9:20 AM 06/28/2015    2:17 PM  GAD 7 : Generalized Anxiety Score  Nervous, Anxious, on Edge 0 1 0 0  Control/stop worrying 0 0 0 0  Worry too much - different things 0 0 0 0  Trouble relaxing 0 0 0 0  Restless 0 0 0   Easily annoyed or irritable 1 2 1  0  Afraid - awful might happen 0 0 0 0  Total GAD 7 Score 1 3 1             Immunization History  Administered Date(s) Administered   Influenza,inj,Quad PF,6+ Mos 02/04/2013, 01/18/2014, 01/22/2015, 02/04/2022   Influenza-Unspecified 02/11/2021   PFIZER(Purple Top)SARS-COV-2 Vaccination 07/14/2019, 08/08/2019, 05/04/2020   Pfizer Covid-19 Vaccine Bivalent Booster 72yrs & up 02/01/2021   Tdap 11/24/2014   Unspecified SARS-COV-2 Vaccination 02/04/2022   Zoster Recombinant(Shingrix) 03/28/2022, 09/11/2022     Past Medical History:  Diagnosis Date   Allergy  Anxiety    Bloody stool 07/07/2016   C. difficile colitis 08/03/2018   Clostridioides difficile infection    Depression    Endometriosis 06/28/2015   GAD (generalized anxiety disorder) 04/01/2018   Hemangioma of skin and subcutaneous tissue 08/21/2021   History of ectopic pregnancy    04/ 2000/11/30  S/P  LEFT SALPINGECTOMY   History of endometriosis    History of neoplasm 08/21/2021   History of ovarian cyst    Hypertension    Left ovarian cyst    LGSIL on Pap smear of cervix 08/20/2020   Menorrhagia with regular cycle    PONV (postoperative nausea and vomiting)    Shingles    Uterine fibroid    Allergies  Allergen Reactions   Augmentin [Amoxicillin-Pot Clavulanate] Other (See Comments)    Diarrhea C.diff positive.   Past Surgical History:  Procedure  Laterality Date   APPENDECTOMY     DILATATION & CURETTAGE/HYSTEROSCOPY WITH MYOSURE N/A 10/30/2016   Procedure: , REMOVAL IUD, HYSTEROSCOPY, DILATION AND CURETTAGE;  Surgeon: Genia Del, MD;  Location: [facility];  Service: Gynecology;  Laterality: N/A;   DILATION AND EVACUATION  03/10/2003   dr Seymour Bars   retained placental tissue postpartum   DX LAPAROSCOPY/ LYSIS ADHESIONAS/  FULGERATION ENDOMETRIOSIS/  LEFT SALPINGECTOMY WITH REMOVAL ECOTPIC  08/18/2000   dr Billy [place]   HYSTEROSCOPY WITH NOVASURE N/A 10/30/2016   Procedure: HYSTEROSCOPY WITH NOVASURE;  Surgeon: Genia Del, MD;  Location: [facility] [place];  Service: Gynecology;  Laterality: N/A;   LAPAROSCOPIC APPENDECTOMY  11/30/2007   SEPTOPLASTY  30-Nov-2009   WISDOM TOOTH EXTRACTION  1996   Family History  Problem Relation Age of Onset   Depression Mother    Arthritis Mother    Heart disease Mother    Transient ischemic attack Mother    Scoliosis Mother    Kyphosis Mother    Alcohol abuse Father    Depression Father    Diabetes Father    Breast cancer Maternal Aunt 15   Early death Maternal Aunt        cancer   Depression Paternal Uncle    Transient ischemic attack Maternal Grandmother    Alcohol abuse Maternal Grandfather    Leukemia Maternal Grandfather    Early death Maternal Grandfather        leukemia   Arthritis Paternal Grandmother    Dementia Paternal Grandmother    Alcohol abuse Paternal Grandfather    Early death Paternal Grandfather    Colon cancer Neg Hx    Colon polyps Neg Hx    Esophageal cancer Neg Hx    Rectal cancer Neg Hx    Stomach cancer Neg Hx    Social History   Social History Narrative   Widower. Husband died on the job 2011/12/01.   Bachelor of fine arts, currently not working    One child, Pharmacist, community.   No tobacco or drug use, rare use of alcohol.   Drinks caffeinated beverages, takes a daily vitamin   Wears her seatbelt, wears a bicycle, smoke detectors in the  home   exercise at least 3 times a week.   Feels safe in her relationships.   Right handed    Works as Corporate treasurer    Allergies as of 10/31/2022       Reactions   Augmentin [amoxicillin-pot Clavulanate] Other (See Comments)   Diarrhea C.diff positive.        Medication List        Accurate as of 12-01-22  5, 2024 10:14 AM. If you have any questions, ask your nurse or doctor.          ALIGN PO Take by mouth.   amLODipine 5 MG tablet Commonly known as: NORVASC Take 1 tablet (5 mg total) by mouth daily.   B-12 PO Take 1 tablet by mouth every morning.   Biotin 1 MG Caps Take by mouth.   buPROPion 300 MG 24 hr tablet Commonly known as: WELLBUTRIN XL Take 1 tablet (300 mg total) by mouth daily.   DULoxetine 30 MG capsule Commonly known as: CYMBALTA Take 1 capsule (30 mg total) by mouth daily.   DULoxetine 60 MG capsule Commonly known as: CYMBALTA Take 1 capsule (60 mg total) by mouth daily.   folic acid 1 MG tablet Commonly known as: FOLVITE Take 1 mg by mouth daily.   lamoTRIgine 150 MG tablet Commonly known as: LAMICTAL TAKE 1 TABLET BY MOUTH IN THE  EVENING   loratadine 10 MG tablet Commonly known as: CLARITIN Take 10 mg by mouth every evening.   multivitamin with minerals tablet Take 1 tablet by mouth every evening.   Vitamin D3 50 MCG (2000 UT) Tabs Take 4,000 Units by mouth every morning.        All past medical history, surgical history, allergies, family history, immunizations andmedications were updated in the EMR today and reviewed under the history and medication portions of their EMR.     No results found for this or any previous visit (from the past 2160 hour(s)).   No results found.   ROS 14 pt review of systems performed and negative (unless mentioned in an HPI)  Objective: BP 122/88   Pulse 79   Temp 97.9 F (36.6 C)   Ht 5' 9.69" (1.77 m)   Wt 192 lb 9.6 oz (87.4 kg)   SpO2 99%   BMI 27.89 kg/m  Physical Exam Vitals and  nursing note reviewed.  Constitutional:      General: She is not in acute distress.    Appearance: Normal appearance. She is not ill-appearing or toxic-appearing.  HENT:     Head: Normocephalic and atraumatic.     Right Ear: Tympanic membrane, ear canal and external ear normal. There is no impacted cerumen.     Left Ear: Tympanic membrane, ear canal and external ear normal. There is no impacted cerumen.     Nose: No congestion or rhinorrhea.     Mouth/Throat:     Mouth: Mucous membranes are moist.     Pharynx: Oropharynx is clear. No oropharyngeal exudate or posterior oropharyngeal erythema.  Eyes:     General: No scleral icterus.       Right eye: No discharge.        Left eye: No discharge.     Extraocular Movements: Extraocular movements intact.     Conjunctiva/sclera: Conjunctivae normal.     Pupils: Pupils are equal, round, and reactive to light.  Cardiovascular:     Rate and Rhythm: Normal rate and regular rhythm.     Pulses: Normal pulses.     Heart sounds: Normal heart sounds. No murmur heard.    No friction rub. No gallop.  Pulmonary:     Effort: Pulmonary effort is normal. No respiratory distress.     Breath sounds: Normal breath sounds. No stridor. No wheezing, rhonchi or rales.  Chest:     Chest wall: No tenderness.  Abdominal:     General: Abdomen is flat. Bowel sounds are normal. There  is no distension.     Palpations: Abdomen is soft. There is no mass.     Tenderness: There is no abdominal tenderness. There is no right CVA tenderness, left CVA tenderness, guarding or rebound.     Hernia: No hernia is present.  Musculoskeletal:        General: No swelling, tenderness or deformity. Normal range of motion.     Cervical back: Normal range of motion and neck supple. No rigidity or tenderness.     Right lower leg: No edema.     Left lower leg: No edema.  Lymphadenopathy:     Cervical: No cervical adenopathy.  Skin:    General: Skin is warm and dry.     Coloration:  Skin is not jaundiced or pale.     Findings: No bruising, erythema, lesion or rash.  Neurological:     General: No focal deficit present.     Mental Status: She is alert and oriented to person, place, and time. Mental status is at baseline.     Cranial Nerves: No cranial nerve deficit.     Sensory: No sensory deficit.     Motor: No weakness.     Coordination: Coordination normal.     Gait: Gait normal.     Deep Tendon Reflexes: Reflexes normal.  Psychiatric:        Mood and Affect: Mood normal.        Behavior: Behavior normal.        Thought Content: Thought content normal.        Judgment: Judgment normal.      No results found.  Assessment/plan: Dawn Scott is a 52 y.o. female present for CPE and Chronic Conditions/illness Management HTN/obesity/Fhx HD-mother/overweight: Stable Continue amlodipine 5 mg qd Consider statin and/or cardiac CT for screen.    Vitamin D deficiency - vit d collected  Routine general medical examination at a health care facility. Colonoscopy: no fhx. Dr. Christella Hartigan 02/04/2021> 10 yr f/u Mammogram: completed: 08/2022-gyn Cervical cancer screening: last pap: 2023,  Dr. Seymour Bars. Immunizations: tdap UTD 2016, Influenza UTD (encouraged yearly)shingrix completed ([facility])  Infectious disease screening: HIV screen completed- Hep C completed DEXA: routine screen Patient was encouraged to exercise greater than 150 minutes a week. Patient was encouraged to choose a diet filled with fresh fruits and vegetables, and lean meats. AVS provided to patient today for education/recommendation on gender specific health and safety maintenance.  Return in about 5 months (around 04/13/2023).   Orders Placed This Encounter  Procedures   CBC with Differential/Platelet   Comprehensive metabolic panel   Hemoglobin A1c   TSH   Lipid panel   Vitamin D (25 hydroxy)   Meds ordered this encounter  Medications   amLODipine (NORVASC) 5 MG tablet    Sig: Take 1 tablet  (5 mg total) by mouth daily.    Dispense:  90 tablet    Refill:  1    Please send a replace/new response with 90-Day Supply if appropriate to maximize member benefit. Requesting 1 year supply.   Referral Orders  No referral(s) requested today    Electronically signed by: Felix Pacini, DO [facility] [place]

## 2022-10-31 NOTE — Patient Instructions (Signed)
Return in about 5 months (around 04/13/2023).        Great to see you today.  I have refilled the medication(s) we provide.   If labs were collected, we will inform you of lab results once received either by echart message or telephone call.   - echart message- for normal results that have been seen by the patient already.   - telephone call: abnormal results or if patient has not viewed results in their echart.

## 2022-11-05 NOTE — Progress Notes (Unsigned)
   Assessment/Plan:   Cervical Dystonia  -first injections 09/19/22  - The primary muscles involved are the R SCM, L [place] and L levator scapulae.   She feels tightness in L trap  -she feels worn off after few weeks but I definitely see improvements today  -will increase dose to 300 U and increase dose to L [place] and perhaps more to the L trap/cervical paraspinal  -combine with PT next time  -if above not help, appeal for xeomin (prior declined by insurance)    Subjective:   Iva Lento was seen today in follow up for cervical dystonia.  My previous records as well as any outside records available were reviewed prior to todays visit.  Pt had her first Botox injections on May 24.  She reports today that it took a few weeks to kick in but feels like it only worked for a few weeks.  She feels that it has already worn off.      CURRENT MEDICATIONS:  Outpatient Encounter Medications as of 11/06/2022  Medication Sig   amLODipine (NORVASC) 5 MG tablet Take 1 tablet (5 mg total) by mouth daily.   Biotin 1 MG CAPS Take by mouth.   buPROPion (WELLBUTRIN XL) 300 MG 24 hr tablet Take 1 tablet (300 mg total) by mouth daily.   Cholecalciferol (VITAMIN D3) 50 MCG (2000 UT) TABS Take 4,000 Units by mouth every morning.   Cyanocobalamin (B-12 PO) Take 1 tablet by mouth every morning.    DULoxetine (CYMBALTA) 30 MG capsule Take 1 capsule (30 mg total) by mouth daily.   DULoxetine (CYMBALTA) 60 MG capsule Take 1 capsule (60 mg total) by mouth daily.   folic acid (FOLVITE) 1 MG tablet Take 1 mg by mouth daily.   lamoTRIgine (LAMICTAL) 150 MG tablet TAKE 1 TABLET BY MOUTH IN THE  EVENING   loratadine (CLARITIN) 10 MG tablet Take 10 mg by mouth every evening.    Multiple Vitamins-Minerals (MULTIVITAMIN WITH MINERALS) tablet Take 1 tablet by mouth every evening.    Probiotic Product (ALIGN PO) Take by mouth.   No facility-administered encounter medications on file as of 11/06/2022.     Objective:    PHYSICAL EXAMINATION:    VITALS:   Vitals:   11/06/22 1427  BP: 124/84  Pulse: 73  SpO2: 96%  Weight: 191 lb 3.2 oz (86.7 kg)  Height: 5\' 9"  (1.753 m)    GEN:  The patient appears stated age and is in NAD. HEENT:  Normocephalic, atraumatic.  The mucous membranes are moist.  Neck/HEME:  no head bend toward shoulder today and marked less hypertrophy of R SCM.  Very little head tremor today (almost none)    Gait and Station: The patient has no difficulty arising out of a deep-seated chair without the use of the hands. The patient's stride length is good.      Cc:  Kuneff, Renee A, DO

## 2022-11-06 ENCOUNTER — Encounter: Payer: Self-pay | Admitting: Neurology

## 2022-11-06 ENCOUNTER — Ambulatory Visit (INDEPENDENT_AMBULATORY_CARE_PROVIDER_SITE_OTHER): Payer: 59 | Admitting: Neurology

## 2022-11-06 VITALS — BP 124/84 | HR 73 | Ht 69.0 in | Wt 191.2 lb

## 2022-11-06 DIAGNOSIS — G243 Spasmodic torticollis: Secondary | ICD-10-CM

## 2022-11-22 ENCOUNTER — Other Ambulatory Visit: Payer: Self-pay | Admitting: Physician Assistant

## 2022-11-28 ENCOUNTER — Encounter: Payer: Self-pay | Admitting: Obstetrics and Gynecology

## 2022-11-28 ENCOUNTER — Ambulatory Visit (INDEPENDENT_AMBULATORY_CARE_PROVIDER_SITE_OTHER): Payer: 59 | Admitting: Obstetrics and Gynecology

## 2022-11-28 VITALS — BP 132/86 | HR 87 | Ht 69.0 in | Wt 193.0 lb

## 2022-11-28 DIAGNOSIS — Z803 Family history of malignant neoplasm of breast: Secondary | ICD-10-CM | POA: Diagnosis not present

## 2022-11-28 DIAGNOSIS — Z8041 Family history of malignant neoplasm of ovary: Secondary | ICD-10-CM | POA: Diagnosis not present

## 2022-11-28 DIAGNOSIS — N951 Menopausal and female climacteric states: Secondary | ICD-10-CM | POA: Diagnosis not present

## 2022-11-28 MED ORDER — GABAPENTIN 100 MG PO CAPS: ORAL_CAPSULE | ORAL | 1 refills | Status: DC

## 2022-11-28 NOTE — Patient Instructions (Signed)
Gabapentin Capsules or Tablets What is this medication? GABAPENTIN (GA ba pen tin) treats nerve pain. It may also be used to prevent and control seizures in people with epilepsy. It works by calming overactive nerves in your body. This medicine may be used for other purposes; ask your health care provider or pharmacist if you have questions. COMMON BRAND NAME(S): Active-PAC with Gabapentin, Gabarone, Gralise, Neurontin What should I tell my care team before I take this medication? They need to know if you have any of these conditions: Kidney disease Lung or breathing disease Substance use disorder Suicidal thoughts, plans, or attempt by you or a family member An unusual or allergic reaction to gabapentin, other medications, foods, dyes, or preservatives Pregnant or trying to get pregnant Breastfeeding How should I use this medication? Take this medication by mouth with a glass of water. Follow the directions on the prescription label. You can take it with or without food. If it upsets your stomach, take it with food. Take your medication at regular intervals. Do not take it more often than directed. Do not stop taking except on your care team's advice. If you are directed to break the 600 or 800 mg tablets in half as part of your dose, the extra half tablet should be used for the next dose. If you have not used the extra half tablet within 28 days, it should be thrown away. A special MedGuide will be given to you by the pharmacist with each prescription and refill. Be sure to read this information carefully each time. Talk to your care team about the use of this medication in children. While this medication may be prescribed for children as young as 3 years for selected conditions, precautions do apply. Overdosage: If you think you have taken too much of this medicine contact a poison control center or emergency room at once. NOTE: This medicine is only for you. Do not share this medicine with  others. What if I miss a dose? If you miss a dose, take it as soon as you can. If it is almost time for your next dose, take only that dose. Do not take double or extra doses. What may interact with this medication? Alcohol Antihistamines for allergy, cough, and cold Certain medications for anxiety or sleep Certain medications for depression like amitriptyline, fluoxetine, sertraline Certain medications for seizures like phenobarbital, primidone Certain medications for stomach problems General anesthetics like halothane, isoflurane, methoxyflurane, propofol Local anesthetics like lidocaine, pramoxine, tetracaine Medications that relax muscles for surgery Opioid medications for pain Phenothiazines like chlorpromazine, mesoridazine, prochlorperazine, thioridazine This list may not describe all possible interactions. Give your health care provider a list of all the medicines, herbs, non-prescription drugs, or dietary supplements you use. Also tell them if you smoke, drink alcohol, or use illegal drugs. Some items may interact with your medicine. What should I watch for while using this medication? Visit your care team for regular checks on your progress. You may want to keep a record at home of how you feel your condition is responding to treatment. You may want to share this information with your care team at each visit. You should contact your care team if your seizures get worse or if you have any new types of seizures. Do not stop taking this medication or any of your seizure medications unless instructed by your care team. Stopping your medication suddenly can increase your seizures or their severity. This medication may cause serious skin reactions. They can happen weeks to   months after starting the medication. Contact your care team right away if you notice fevers or flu-like symptoms with a rash. The rash may be red or purple and then turn into blisters or peeling of the skin. Or, you might  notice a red rash with swelling of the face, lips or lymph nodes in your neck or under your arms. Wear a medical identification bracelet or chain if you are taking this medication for seizures. Carry a card that lists all your medications. This medication may affect your coordination, reaction time, or judgment. Do not drive or operate machinery until you know how this medication affects you. Sit up or stand slowly to reduce the risk of dizzy or fainting spells. Drinking alcohol with this medication can increase the risk of these side effects. Your mouth may get dry. Chewing sugarless gum or sucking hard candy, and drinking plenty of water may help. Watch for new or worsening thoughts of suicide or depression. This includes sudden changes in mood, behaviors, or thoughts. These changes can happen at any time but are more common in the beginning of treatment or after a change in dose. Call your care team right away if you experience these thoughts or worsening depression. If you become pregnant while using this medication, you may enroll in the [place] Antiepileptic Drug Pregnancy Registry by calling 1-888-233-2334. This registry collects information about the safety of antiepileptic medication use during pregnancy. What side effects may I notice from receiving this medication? Side effects that you should report to your care team as soon as possible: Allergic reactions or angioedema--skin rash, itching, hives, swelling of the face, eyes, lips, tongue, arms, or legs, trouble swallowing or breathing Rash, fever, and swollen lymph nodes Thoughts of suicide or self harm, worsening mood, feelings of depression Trouble breathing Unusual changes in mood or behavior in children after use such as difficulty concentrating, hostility, or restlessness Side effects that usually do not require medical attention (report to your care team if they continue or are  bothersome): Dizziness Drowsiness Nausea Swelling of ankles, feet, or hands Vomiting This list may not describe all possible side effects. Call your doctor for medical advice about side effects. You may report side effects to FDA at 1-800-FDA-1088. Where should I keep my medication? Keep out of reach of children and pets. Store at room temperature between 15 and 30 degrees C (59 and 86 degrees F). Get rid of any unused medication after the expiration date. This medication may cause accidental overdose and death if taken by other adults, children, or pets. To get rid of medications that are no longer needed or have expired: Take the medication to a medication take-back program. Check with your pharmacy or law enforcement to find a location. If you cannot return the medication, check the label or package insert to see if the medication should be thrown out in the garbage or flushed down the toilet. If you are not sure, ask your care team. If it is safe to put it in the trash, empty the medication out of the container. Mix the medication with cat litter, dirt, coffee grounds, or other unwanted substance. Seal the mixture in a bag or container. Put it in the trash. NOTE: This sheet is a summary. It may not cover all possible information. If you have questions about this medicine, talk to your doctor, pharmacist, or health care provider.  2024 Elsevier/Gold Standard (2022-01-28 00:00:00)  

## 2022-11-28 NOTE — Progress Notes (Signed)
GYNECOLOGY  VISIT   HPI: 52 y.o.   Widowed  Caucasian  female   [id number] with Patient's last menstrual period was 06/05/2022.  LMP was one day of spotting. Her prior LMP was November or December, 2023.   here for discuss menopausal symptoms- night sweats, trouble sleeping, hair loss, forgetfulness, hunger and headaches. Not sleeping well is affecting her.  Trying to have good sleep hygiene.  Tried Melatonin which caused her to be sleepy the next day.  Increased exercise.   Low sodium.  TSH 2.78 on 10/31/22.  2 cups of coffee in the am.  Reducing intake or processed sugar.   Some stress at work.   Hx endometriosis.  Has had an ablation, or partial due to arcuate shaped uterus.   FH stroke in mother and maternal grandmother.   FH of breast and ovarian cancer.  Patient's mother and her sister tested negative for BRCA.  Single mother. Lost her husband to an accident.  Daughter is 9.  Working as a Ship broker.   Takes Wellbutrin, Lamictal, and Cymbalta for depression and anxiety.  GYNECOLOGIC HISTORY: Patient's last menstrual period was 06/05/2022. Contraception:  abstinence Menopausal hormone therapy:  n/a Last mammogram:  08/30/22 Breast density Cat C, BI-RADS CAT 1 neg Last pap smear:   01/06/22 neg, 11/06/20 ASCUS: HR HPV neg        OB History     Gravida  2   Para  1   Term      Preterm      AB  1   Living  1      SAB      IAB      Ectopic  1   Multiple      Live Births                 Patient Active Problem List   Diagnosis Date Noted   Cervical dystonia 02/14/2022   Tremor 02/14/2022   FH: heart disease 08/20/2020   Overweight (BMI 25.0-29.9) 02/23/2019   Essential hypertension 02/23/2019   Vitamin D deficiency 06/28/2015   Depression 06/16/2012   Insomnia 06/12/2012    Past Medical History:  Diagnosis Date   Allergy    Anxiety    Bloody stool 07/07/2016   C. difficile colitis 08/03/2018   Clostridioides difficile  infection    Depression    Endometriosis 06/28/2015   GAD (generalized anxiety disorder) 04/01/2018   Hemangioma of skin and subcutaneous tissue 08/21/2021   History of ectopic pregnancy    04/ 2002  S/P  LEFT SALPINGECTOMY   History of endometriosis    History of neoplasm 08/21/2021   History of ovarian cyst    Hypertension    Left ovarian cyst    LGSIL on Pap smear of cervix 08/20/2020   Menorrhagia with regular cycle    PONV (postoperative nausea and vomiting)    Shingles    Uterine fibroid     Past Surgical History:  Procedure Laterality Date   APPENDECTOMY     DILATATION & CURETTAGE/HYSTEROSCOPY WITH MYOSURE N/A 10/30/2016   Procedure: , REMOVAL IUD, HYSTEROSCOPY, DILATION AND CURETTAGE;  Surgeon: Genia Del, MD;  Location: [facility];  Service: Gynecology;  Laterality: N/A;   DILATION AND EVACUATION  03/10/2003   dr Seymour Bars   retained placental tissue postpartum   DX LAPAROSCOPY/ LYSIS ADHESIONAS/  FULGERATION ENDOMETRIOSIS/  LEFT SALPINGECTOMY WITH REMOVAL ECOTPIC  08/18/2000   dr Billy Coast   HYSTEROSCOPY WITH NOVASURE N/A 10/30/2016  Procedure: HYSTEROSCOPY WITH NOVASURE;  Surgeon: Genia Del, MD;  Location: [facility];  Service: Gynecology;  Laterality: N/A;   LAPAROSCOPIC APPENDECTOMY  11/30/2007   SEPTOPLASTY  2009/12/22   WISDOM TOOTH EXTRACTION  December 23, 1994    Current Outpatient Medications  Medication Sig Dispense Refill   amLODipine (NORVASC) 5 MG tablet Take 1 tablet (5 mg total) by mouth daily. 90 tablet 1   Biotin 1 MG CAPS Take by mouth.     buPROPion (WELLBUTRIN XL) 300 MG 24 hr tablet Take 1 tablet (300 mg total) by mouth daily. 90 tablet 1   Cholecalciferol (VITAMIN D3) 50 MCG (2000 UT) TABS Take 4,000 Units by mouth every morning. 60 tablet 11   Cyanocobalamin (B-12 PO) Take 1 tablet by mouth every morning.      DULoxetine (CYMBALTA) 30 MG capsule TAKE 1 CAPSULE BY MOUTH DAILY 90 capsule 3   DULoxetine (CYMBALTA) 60 MG  capsule TAKE 1 CAPSULE BY MOUTH DAILY 90 capsule 3   folic acid (FOLVITE) 1 MG tablet Take 1 mg by mouth daily.     lamoTRIgine (LAMICTAL) 150 MG tablet TAKE 1 TABLET BY MOUTH IN THE  EVENING 90 tablet 3   loratadine (CLARITIN) 10 MG tablet Take 10 mg by mouth every evening.      Multiple Vitamins-Minerals (MULTIVITAMIN WITH MINERALS) tablet Take 1 tablet by mouth every evening.      Probiotic Product (ALIGN PO) Take by mouth.     No current facility-administered medications for this visit.     ALLERGIES: Augmentin [amoxicillin-pot clavulanate]  Family History  Problem Relation Age of Onset   Depression Mother    Arthritis Mother    Heart disease Mother    Transient ischemic attack Mother    Scoliosis Mother    Kyphosis Mother    Alcohol abuse Father    Depression Father    Diabetes Father    Breast cancer Maternal Aunt 70   Early death Maternal Aunt        cancer   Depression Paternal Uncle    Transient ischemic attack Maternal Grandmother    Alcohol abuse Maternal Grandfather    Leukemia Maternal Grandfather    Early death Maternal Grandfather        leukemia   Arthritis Paternal Grandmother    Dementia Paternal Grandmother    Alcohol abuse Paternal Grandfather    Early death Paternal Grandfather    Colon cancer Neg Hx    Colon polyps Neg Hx    Esophageal cancer Neg Hx    Rectal cancer Neg Hx    Stomach cancer Neg Hx     Social History   Socioeconomic History   Marital status: Widowed    Spouse name: Not on file   Number of children: Not on file   Years of education: Not on file   Highest education level: Bachelor's degree (e.g., BA, AB, BS)  Occupational History   Occupation: admin and training work  Tobacco Use   Smoking status: Never   Smokeless tobacco: Never  Vaping Use   Vaping status: Never Used  Substance and Sexual Activity   Alcohol use: Yes    Comment: on special occasions   Drug use: No   Sexual activity: Not Currently    Birth  control/protection: Abstinence  Other Topics Concern   Not on file  Social History Narrative   Widower. Husband died on the job 12/23/2011.   Bachelor of fine arts, currently not working    One  child, Dawn Scott.   No tobacco or drug use, rare use of alcohol.   Drinks caffeinated beverages, takes a daily vitamin   Wears her seatbelt, wears a bicycle, smoke detectors in the home   exercise at least 3 times a week.   Feels safe in her relationships.   Right handed    Works as Public affairs consultant Strain: Low Risk  (02/14/2022)   Overall Financial Resource Strain (CARDIA)    Difficulty of Paying Living Expenses: Not very hard  Food Insecurity: No Food Insecurity (02/14/2022)   Hunger Vital Sign    Worried About Running Out of Food in the Last Year: Never true    Ran Out of Food in the Last Year: Never true  Transportation Needs: No Transportation Needs (02/14/2022)   PRAPARE - Administrator, Civil Service (Medical): No    Lack of Transportation (Non-Medical): No  Physical Activity: Unknown (02/14/2022)   Exercise Vital Sign    Days of Exercise per Week: 0 days    Minutes of Exercise per Session: Not on file  Stress: No Stress Concern Present (02/14/2022)   [employer] of Occupational Health - Occupational Stress Questionnaire    Feeling of Stress : Only a little  Social Connections: Socially Isolated (02/14/2022)   Social Connection and Isolation Panel [NHANES]    Frequency of Communication with Friends and Family: More than three times a week    Frequency of Social Gatherings with Friends and Family: Three times a week    Attends Religious Services: Never    Active Member of Clubs or Organizations: No    Attends Banker Meetings: Not on file    Marital Status: Widowed  Intimate Partner Violence: Unknown (08/02/2021)   Received from [facility]   HITS    Physically Hurt: Not on file    Insult or Talk  Down To: Not on file    Threaten Physical Harm: Not on file    Scream or Curse: Not on file    Review of Systems  All other systems reviewed and are negative.   PHYSICAL EXAMINATION:    BP 132/86 (BP Location: Right Arm, Patient Position: Sitting, Cuff Size: Normal)   Pulse 87   Ht 5\' 9"  (1.753 m)   Wt 193 lb (87.5 kg)   LMP 06/05/2022 Comment: Very light  SpO2 98%   BMI 28.50 kg/m     General appearance: alert, cooperative and appears stated age   ASSESSMENT  Menopausal symptoms.   FH breast and ovarian cancer.  Depression and anxiety.   PLAN  Will check FSH and estradiol. If not menopausal, will do Rx for Provera 5 gm x 5 days.  Patient aware.  We discussed menopause and treatment options: HRT, SSRI, SNRI, Gabapentin, Veozah, herbal options.  Patient desires to try Gabapentin.  I did an interaction check with her meds and discussed potential side effects.  Rx for 100 mg at hs x 3 nights, and increase by 100 mg every 3 nights to reach a dose of 300 mg nightly if this dose is needed.  Brochure to patient on menopause.  We discussed genetic counseling and testing.  She will let me know if she wants to proceed. FU in 6 weeks.    35 min  total time was spent for this patient encounter, including preparation, face-to-face counseling with the patient, coordination of care, and documentation of the encounter.

## 2022-12-02 ENCOUNTER — Other Ambulatory Visit: Payer: Self-pay | Admitting: Physician Assistant

## 2022-12-07 ENCOUNTER — Encounter: Payer: Self-pay | Admitting: Obstetrics and Gynecology

## 2022-12-07 DIAGNOSIS — Z79890 Hormone replacement therapy (postmenopausal): Secondary | ICD-10-CM

## 2022-12-08 NOTE — Telephone Encounter (Signed)
Ok to discontinue the Gabapentin and start hormone therapy (HRT).   She will need to take two medications:  estrogen and progesterone.  - Vivelle Dot 0.05 mg transdermal estrogen, place patch to clean dry skin of the abdomen twice weekly.  Disp:  24, RF none.  - Prometrium 100 mg po q hs.  Disp:  90, RF none.  Potential risks of HRT over time are stroke, heart attack, blood clots in the lungs and extremities, and breast cancer.  These risks accumulate after taking the hormones for 4 years or more.  I would recommend an office visit in about 10 weeks to see how she is doing on the estrogen therapy.

## 2022-12-08 NOTE — Telephone Encounter (Signed)
Per review of EPIC, patient seen in office on 11/28/22 for menopausal symptoms, started on Gabapentin. Other treatment options discussed.   Routing MyChart message to Dr. Edward Jolly to review and advise on alternative.

## 2022-12-09 NOTE — Telephone Encounter (Signed)
Pt returned call stating ok to send msg via mychart regarding Dr. Rica Records response/recommendations.

## 2022-12-09 NOTE — Telephone Encounter (Signed)
Left message to call [facility] Triage at [phone], option 4.    Routing to [facility] Triage

## 2022-12-09 NOTE — Telephone Encounter (Signed)
Pt returned call.  CB, no answer, LVMTCB. Advised her to let us know if easier for her to communicate via mychart.

## 2022-12-10 MED ORDER — PROGESTERONE MICRONIZED 100 MG PO CAPS
100.0000 mg | ORAL_CAPSULE | Freq: Every day | ORAL | 0 refills | Status: DC
Start: 2022-12-10 — End: 2023-03-03

## 2022-12-10 MED ORDER — ESTRADIOL 0.05 MG/24HR TD PTTW
1.0000 | MEDICATED_PATCH | TRANSDERMAL | 0 refills | Status: DC
Start: 2022-12-11 — End: 2023-03-03

## 2022-12-11 NOTE — Telephone Encounter (Signed)
Per review of EPIC, patient is scheduled for OV with Dr. Edward Jolly on 01/19/23 at 1600.  Routing to Dr. Marjorie Smolder.   Encounter closed.

## 2022-12-19 ENCOUNTER — Ambulatory Visit (INDEPENDENT_AMBULATORY_CARE_PROVIDER_SITE_OTHER): Payer: 59 | Admitting: Neurology

## 2022-12-19 DIAGNOSIS — G243 Spasmodic torticollis: Secondary | ICD-10-CM

## 2022-12-19 MED ORDER — ONABOTULINUMTOXINA 100 UNITS IJ SOLR
300.0000 [IU] | Freq: Once | INTRAMUSCULAR | Status: AC
Start: 2022-12-19 — End: 2022-12-19
  Administered 2022-12-19: 285 [IU] via INTRAMUSCULAR

## 2022-12-19 NOTE — Procedures (Signed)
[facility]   Procedure Note Botox  Attending: Dr. Lurena Joiner Shaundrea Carrigg  Preoperative Diagnosis(es): Cervical Dystonia  Result History  Helped but wore off at least 3 weeks ago  Consent obtained from: The patient Benefits discussed included, but were not limited to decreased muscle tightness, increased joint range of motion, and decreased pain.  Risk discussed included, but were not limited pain and discomfort, bleeding, bruising, excessive weakness, venous thrombosis, muscle atrophy and dysphagia.  A copy of the patient medication guide was given to the patient which explains the blackbox warning.  Patients identity and treatment sites confirmed Yes.  .  Details of Procedure: Skin was cleaned with alcohol.  A 30 gauge, 25mm  needle was introduced to the target muscle, except for posterior splenius where 27 gauge, 1.5 inch needle used.   Prior to injection, the needle plunger was aspirated to make sure the needle was not within a blood vessel.  There was no blood retrieved on aspiration.    Following is a summary of the muscles injected  And the amount of Botulinum toxin used:   Dilution 0.9% preservative free saline mixed with 100 u Botox type A to make 10 U per 0.1cc  Injections  Location Left  Right Units Number of sites        Sternocleidomastoid  60/20 80 2  Splenius Capitus, posterior approach 100  100 1  Splenius Capitus, lateral approach 30  30 1   Levator Scapulae 15  15 1   Trapezius 20 x 3  60 3        TOTAL UNITS:   285    Agent: Botulinum Type A ( Onobotulinum Toxin type A ).  3 vials of Botox were used, each containing 100 units and freshly diluted with 1 mL of sterile, non-preserved saline   Total injected (Units): 285  Total wasted (Units): 15   Pt tolerated procedure well without complications.   Reinjection is anticipated in 3 months.

## 2022-12-26 ENCOUNTER — Other Ambulatory Visit: Payer: Self-pay

## 2022-12-26 ENCOUNTER — Ambulatory Visit: Payer: 59 | Attending: Neurology | Admitting: Physical Therapy

## 2022-12-26 DIAGNOSIS — R293 Abnormal posture: Secondary | ICD-10-CM | POA: Diagnosis present

## 2022-12-26 DIAGNOSIS — M6281 Muscle weakness (generalized): Secondary | ICD-10-CM

## 2022-12-26 DIAGNOSIS — G243 Spasmodic torticollis: Secondary | ICD-10-CM | POA: Insufficient documentation

## 2022-12-26 DIAGNOSIS — M542 Cervicalgia: Secondary | ICD-10-CM

## 2022-12-26 NOTE — Therapy (Signed)
OUTPATIENT PHYSICAL THERAPY NEURO EVALUATION   Patient Name: Dawn Scott MRN: [id number] DOB:[birth year], 52 y.o., female Today's Date: 12/26/2022   PCP: Natalia Leatherwood, DO REFERRING PROVIDER: Vladimir Faster, DO   END OF SESSION:  PT End of Session - 12/26/22 1104     Visit Number 1    Number of Visits 13    Date for PT Re-Evaluation 03/20/23    Authorization Type UHC-60 visit limit/caendar year    PT Start Time 1018    PT Stop Time 1102    PT Time Calculation (min) 44 min    Activity Tolerance Patient tolerated treatment well    Behavior During Therapy [facility] for tasks assessed/performed             Past Medical History:  Diagnosis Date   Allergy    Anxiety    Bloody stool 07/07/2016   C. difficile colitis 08/03/2018   Clostridioides difficile infection    Depression    Endometriosis 06/28/2015   GAD (generalized anxiety disorder) 04/01/2018   Hemangioma of skin and subcutaneous tissue 08/21/2021   History of ectopic pregnancy    04/ 2002  S/P  LEFT SALPINGECTOMY   History of endometriosis    History of neoplasm 08/21/2021   History of ovarian cyst    Hypertension    Left ovarian cyst    LGSIL on Pap smear of cervix 08/20/2020   Menorrhagia with regular cycle    PONV (postoperative nausea and vomiting)    Shingles    Uterine fibroid    Past Surgical History:  Procedure Laterality Date   APPENDECTOMY     DILATATION & CURETTAGE/HYSTEROSCOPY WITH MYOSURE N/A 10/30/2016   Procedure: , REMOVAL IUD, HYSTEROSCOPY, DILATION AND CURETTAGE;  Surgeon: Genia Del, MD;  Location: [facility];  Service: Gynecology;  Laterality: N/A;   DILATION AND EVACUATION  03/10/2003   dr Seymour Bars   retained placental tissue postpartum   DX LAPAROSCOPY/ LYSIS ADHESIONAS/  FULGERATION ENDOMETRIOSIS/  LEFT SALPINGECTOMY WITH REMOVAL ECOTPIC  08/18/2000   dr Billy Coast   HYSTEROSCOPY WITH NOVASURE N/A 10/30/2016   Procedure: HYSTEROSCOPY WITH NOVASURE;  Surgeon:  Genia Del, MD;  Location: [facility];  Service: Gynecology;  Laterality: N/A;   LAPAROSCOPIC APPENDECTOMY  11/30/2007   SEPTOPLASTY  2011   WISDOM TOOTH EXTRACTION  1996   Patient Active Problem List   Diagnosis Date Noted   Cervical dystonia 02/14/2022   Tremor 02/14/2022   FH: heart disease 08/20/2020   Overweight (BMI 25.0-29.9) 02/23/2019   Essential hypertension 02/23/2019   Vitamin D deficiency 06/28/2015   Depression 06/16/2012   Insomnia 06/12/2012    ONSET DATE: 12/19/2022 MD referral  REFERRING DIAG: G24.3 (ICD-10-CM) - Cervical dystonia   THERAPY DIAG:  Cervicalgia  Abnormal posture  Muscle weakness (generalized)  Rationale for Evaluation and Treatment: Rehabilitation  SUBJECTIVE:  SUBJECTIVE STATEMENT: This all started about 20 years ago, with some head tremors.  Have noted it more in the past several years-more frequent and significant.  Was having pain in my neck, L shoulder.  Have had acupuncture in the past, but didn't help for the pain.  Have started Botox-in May and again last Friday.  Had a bit of relief from the Botox, more so this round than the first.  Woke up and did not have pain.   Pt accompanied by: self  PERTINENT HISTORY: cervical dystonia with first Botox injections May 24 (began as head tremor in 2004); have headaches  PAIN:  Are you having pain? No Typical pain is 7/10 base of head and into L shoulder  PRECAUTIONS: None  RED FLAGS: None   WEIGHT BEARING RESTRICTIONS: No  FALLS: Has patient fallen in last 6 months? No  PLOF: Independent and Vocation/Vocational requirements: works as Production designer, theatre/television/film at a desk/computer  PATIENT GOALS: To help to get to the point where I have a regular routine for stretching and  exercise.  OBJECTIVE:   DIAGNOSTIC FINDINGS: See below- Per Dr Tat note:   The primary muscles involved are the R SCM, L Yates and L levator scapulae.  She has pain over the L trapezius as well. (R SCM hypertrophy) COGNITION: Overall cognitive status: Within functional limits for tasks assessed   SENSATION: Occasional tingling R hand  POSTURE: rounded shoulders and head shift to the right head initially appears rotated right, its the sidebend to the left-per MD note  Cervical ROM:  R Rotation  40 L rotation  30 *Tight Flexion  40  *Tense on L Extension  10 Tight on L R sidebending 30 L sidebending 20 *pain with return to midline   Palpation:  Tender to palpation at L upper traps, mild trigger point Tender to palpation at occiput, L > R     PATIENT SURVEYS:  NDI Answers for current level of pain and pre-Botox Item  Score 12/25/22  Score pre-Botox 1   0    4 2   0    0 3   0    0 4   1    2 5   0    3 6   0    1 7   0    1 8   1    2 9    1    3 10    0    2 ------------------------------------------------------------------------    3    18    (6% disability)  (36% disability) TODAY'S TREATMENT:                                                                                                                              DATE: 12/26/2022    PATIENT EDUCATION: Education details: PT eval results, POC; HEP initiated Person educated: Patient Education method: Explanation, Demonstration, and Handouts Education comprehension: verbalized understanding and returned demonstration  HOME EXERCISE PROGRAM:  Access Code: [email or web address] URL: [email or web address] Date: 12/26/2022 Prepared by: [facility] - Outpatient  Rehab - [facility]  Exercises - Gentle Levator Scapulae Stretch  - 1-2 x daily - 7 x weekly - 1 sets - 3-5 reps - 15-30 sec hold - Seated Chin Tuck with Neck Elongation  - 1-2 x daily - 7 x weekly - 1 sets - 5 reps - 10 sec hold - Shoulder Rolls  in Sitting  - 1-2 x daily - 7 x weekly - 1-2 sets - 5-10 reps GOALS: Goals reviewed with patient? Yes  SHORT TERM GOALS: Target date: 01/23/2023  Pt will be independent with HEP for improved flexibility, pain. Baseline: Goal status: INITIAL  2.  Neck rotation and sidebending to Left to improve by 5 degrees for improved driving motion. Baseline:  Goal status: INITIAL  LONG TERM GOALS: Target date: 03/20/2023  Pt will be independent with HEP for improved neck flexibility, posture, pain. Baseline:  Goal status: INITIAL  2.  Neck rotation and sidebending to Left to improve by 10 degrees for improved painfree range of motion. Baseline:  Goal status: INITIAL  3.  Pt will verbalize posture and positioning techniques to decrease pain during work hours. Baseline:  Goal status: INITIAL  4.  NDI score (at worst) to improve to score of 13 or less. Baseline: initial (worst-pre Botox score 18) Goal status: INITIAL  ASSESSMENT:  CLINICAL IMPRESSION: Patient is a 52 y.o. female who was seen today for physical therapy evaluation and treatment for cervical dystonia.  She reports having approximately 20 year history of head tremor, but in the past few years, it has increased and she has had increased neck pain and limitations with range of motion.  She has seen Dr. Arbutus Leas in May for first round of Botox to cervical spine and neck musculature, with some relief; second round of Botox was one week ago.  Pt is noting significant relief from neck pain from this round of Botox.  She does present with limitations in neck flexibility and range of motion, abnormal posture, some pain with end range motion of neck, tenderness to palpation at levator scap, subocciptials and upper traps, more on L than R.  She will benefit from skilled PT to address the above through stretching, postural strengthening and potentially dry needling to trigger point areas for improved flexibility and decreased pain.   OBJECTIVE  IMPAIRMENTS: increased muscle spasms, impaired flexibility, postural dysfunction, and pain.   ACTIVITY LIMITATIONS: sitting, reach over head, and hygiene/grooming  PARTICIPATION LIMITATIONS: driving, occupation, and computer work  PERSONAL FACTORS: 3+ comorbidities: see above  are also affecting patient's functional outcome.   REHAB POTENTIAL: Good  CLINICAL DECISION MAKING: Stable/uncomplicated  EVALUATION COMPLEXITY: Low  PLAN:  PT FREQUENCY: 1x/week  PT DURATION: 12 weeks plus eval week  PLANNED INTERVENTIONS: Therapeutic exercises, Therapeutic activity, Neuromuscular re-education, Patient/Family education, Self Care, Joint mobilization, Dry Needling, Electrical stimulation, Cryotherapy, Moist heat, and Manual therapy  PLAN FOR NEXT SESSION: Review initial HEP and progress for stretching, postural strengthening; discuss/pursue dry needling for improved flexibility and decreased pain   Porchia Sinkler W., PT 12/26/2022, 11:14 AM  [facility]patient Rehab at [facility] [address] [place], [place] [address] Phone # [phone] Fax # [phone]

## 2022-12-29 ENCOUNTER — Encounter: Payer: Self-pay | Admitting: Neurology

## 2023-01-05 ENCOUNTER — Ambulatory Visit (INDEPENDENT_AMBULATORY_CARE_PROVIDER_SITE_OTHER): Payer: 59 | Admitting: Physician Assistant

## 2023-01-05 ENCOUNTER — Encounter: Payer: Self-pay | Admitting: Physician Assistant

## 2023-01-05 DIAGNOSIS — E871 Hypo-osmolality and hyponatremia: Secondary | ICD-10-CM | POA: Diagnosis not present

## 2023-01-05 DIAGNOSIS — F3342 Major depressive disorder, recurrent, in full remission: Secondary | ICD-10-CM

## 2023-01-05 DIAGNOSIS — G4700 Insomnia, unspecified: Secondary | ICD-10-CM | POA: Diagnosis not present

## 2023-01-05 DIAGNOSIS — F411 Generalized anxiety disorder: Secondary | ICD-10-CM | POA: Diagnosis not present

## 2023-01-05 NOTE — Progress Notes (Signed)
[facility]  Patient ID: Dawn Scott,  MRN: [id number]  PCP: Natalia Leatherwood, DO  Date of Evaluation: 01/05/2023 Time spent: 31 minutes  Chief Complaint:  Chief Complaint   Depression    HISTORY/CURRENT STATUS: HPI For routine 67-month med check.    Doing well. Feels like meds are working as they should. Her dtr, Zollie Scale who is also my pt, is a Medical laboratory scientific officer at [employer]. She's doing well. Pt is empty nesting and tries to keep herself busy.   Patient is able to enjoy things.  Energy and motivation are good.  Work is going well.   No extreme sadness, tearfulness, or feelings of hopelessness.  Sleeps well most of the time. ADLs and personal hygiene are normal.   Denies any changes in concentration, making decisions, or remembering things.  Appetite has not changed.  Denies anxiety.  Denies suicidal or homicidal thoughts.  Patient denies increased energy with decreased need for sleep, increased talkativeness, racing thoughts, impulsivity or risky behaviors, increased spending, increased libido, grandiosity, increased irritability or anger, paranoia, or hallucinations.  Review of Systems  Constitutional: Negative.   HENT: Negative.    Eyes: Negative.   Respiratory: Negative.    Cardiovascular: Negative.   Gastrointestinal: Negative.   Genitourinary: Negative.   Musculoskeletal: Negative.   Skin: Negative.   Neurological:        See ROS below  Endo/Heme/Allergies: Negative.   Psychiatric/Behavioral:         See HPI    Individual Medical History/ Review of Systems: Changes? :Yes  Started hormone therapy Cervical dystonia dx, seeing Dr. Arbutus Leas, getting Botox  Past medications for mental health diagnoses include: Klonopin, Prozac, Prosom, Ambien, Cymbalta, Wellbutrin, Lamictal   Allergies: Augmentin [amoxicillin-pot clavulanate]  Current Medications:  Current Outpatient Medications:    amLODipine (NORVASC) 5 MG tablet, Take 1 tablet (5 mg total) by mouth daily., Disp:  90 tablet, Rfl: 1   Biotin 1 MG CAPS, Take by mouth., Disp: , Rfl:    buPROPion (WELLBUTRIN XL) 300 MG 24 hr tablet, TAKE 1 TABLET BY MOUTH DAILY, Disp: 90 tablet, Rfl: 3   Cholecalciferol (VITAMIN D3) 50 MCG (2000 UT) TABS, Take 4,000 Units by mouth every morning., Disp: 60 tablet, Rfl: 11   Cyanocobalamin (B-12 PO), Take 1 tablet by mouth every morning. , Disp: , Rfl:    DULoxetine (CYMBALTA) 30 MG capsule, TAKE 1 CAPSULE BY MOUTH DAILY, Disp: 90 capsule, Rfl: 3   DULoxetine (CYMBALTA) 60 MG capsule, TAKE 1 CAPSULE BY MOUTH DAILY, Disp: 90 capsule, Rfl: 3   estradiol (VIVELLE-DOT) 0.05 MG/24HR patch, Place 1 patch (0.05 mg total) onto the skin 2 (two) times a week., Disp: 24 patch, Rfl: 0   folic acid (FOLVITE) 1 MG tablet, Take 1 mg by mouth daily., Disp: , Rfl:    lamoTRIgine (LAMICTAL) 150 MG tablet, TAKE 1 TABLET BY MOUTH IN THE  EVENING, Disp: 90 tablet, Rfl: 3   loratadine (CLARITIN) 10 MG tablet, Take 10 mg by mouth every evening. , Disp: , Rfl:    Multiple Vitamins-Minerals (MULTIVITAMIN WITH MINERALS) tablet, Take 1 tablet by mouth every evening. , Disp: , Rfl:    Probiotic Product (ALIGN PO), Take by mouth., Disp: , Rfl:    progesterone (PROMETRIUM) 100 MG capsule, Take 1 capsule (100 mg total) by mouth at bedtime., Disp: 90 capsule, Rfl: 0   gabapentin (NEURONTIN) 100 MG capsule, Take 1 capsule (100 mg) at bedtime.  Take for 3 nights.  If tolerated, increase  to 2 capsules (200 mg) at bedtime.  Take for 3 nights.  If tolerated, you may take 3 capsules (300 mg) at bedtime. (Patient not taking: Reported on 12/26/2022), Disp: 90 capsule, Rfl: 1 Medication Side Effects: none  Family Medical/ Social History: Changes?  no  MENTAL HEALTH EXAM:  There were no vitals taken for this visit.There is no height or weight on file to calculate BMI.  General Appearance: Casual, Neat and Well Groomed  Eye Contact:  Good  Speech:  Clear and Coherent and Normal Rate  Volume:  Normal  Mood:  Euthymic   Affect:  Congruent  Thought Process:  Goal Directed and Descriptions of Associations: Circumstantial  Orientation:  Full (Time, Place, and Person)  Thought Content: Logical   Suicidal Thoughts:  No  Homicidal Thoughts:  No  Memory:  WNL  Judgement:  Good  Insight:  Good  Psychomotor Activity:  Normal  Concentration:  Concentration: Good and Attention Span: Good  Recall:  Good  Fund of Knowledge: Good  Language: Good  Assets:  Desire for Improvement Financial Resources/Insurance Housing Transportation Vocational/Educational  ADL's:  Intact  Cognition: WNL  Prognosis:  Good   Labs 10/31/2022 CBC with differential completely normal CMP sodium 131, chloride 94, otherwise normal Hemoglobin A1c 5.3 TSH 2.78 Vitamin D 51.4  DIAGNOSES:    ICD-10-CM   1. Recurrent major depressive disorder, in full remission (HCC)  F33.42     2. Generalized anxiety disorder  F41.1     3. Hyponatremia  E87.1     4. Insomnia, unspecified type  G47.00      Receiving Psychotherapy: No    RECOMMENDATIONS:  PDMP was reviewed.  Gabapentin filled 11/28/2022.   I provided 20 minutes of face to face time during this encounter, including time spent before and after the visit in records review, medical decision making, counseling pertinent to today's visit, and charting.   She's doing well so no changes are needed. Recommend drinking 1 glass of Gatorade per day to help with sodium.  Continue Wellbutrin XL 300 mg, 1 p.o. every morning. Continue Cymbalta 30 mg +60 mg daily. Continue Lamictal 150 mg daily. Continue multivitamin, vitamin D, B12, and biotin. Return in 6 months.  Melony Overly, PA-C

## 2023-01-06 NOTE — Therapy (Signed)
OUTPATIENT PHYSICAL THERAPY NEURO TREATMENT   Patient Name: Dawn Scott MRN: [id number] DOB:May 03, 1970, 52 y.o., female Today's Date: 01/08/2023   PCP: Natalia Leatherwood, DO REFERRING PROVIDER: Vladimir Faster, DO   END OF SESSION:  PT End of Session - 01/08/23 0843     Visit Number 2    Number of Visits 13    Date for PT Re-Evaluation 03/20/23    Authorization Type [employer]-60 visit limit/caendar year    PT Start Time 0804    PT Stop Time 0843    PT Time Calculation (min) 39 min    Activity Tolerance Patient tolerated treatment well    Behavior During Therapy [facility] for tasks assessed/performed              Past Medical History:  Diagnosis Date   Allergy    Anxiety    Bloody stool 07/07/2016   C. difficile colitis 08/03/2018   Clostridioides difficile infection    Depression    Endometriosis 06/28/2015   GAD (generalized anxiety disorder) 04/01/2018   Hemangioma of skin and subcutaneous tissue 08/21/2021   History of ectopic pregnancy    04/ 2002  S/P  LEFT SALPINGECTOMY   History of endometriosis    History of neoplasm 08/21/2021   History of ovarian cyst    Hypertension    Left ovarian cyst    LGSIL on Pap smear of cervix 08/20/2020   Menorrhagia with regular cycle    PONV (postoperative nausea and vomiting)    Shingles    Uterine fibroid    Past Surgical History:  Procedure Laterality Date   APPENDECTOMY     DILATATION & CURETTAGE/HYSTEROSCOPY WITH MYOSURE N/A 10/30/2016   Procedure: , REMOVAL IUD, HYSTEROSCOPY, DILATION AND CURETTAGE;  Surgeon: Genia Del, MD;  Location: [facility];  Service: Gynecology;  Laterality: N/A;   DILATION AND EVACUATION  03/10/2003   dr Seymour Bars   retained placental tissue postpartum   DX LAPAROSCOPY/ LYSIS ADHESIONAS/  FULGERATION ENDOMETRIOSIS/  LEFT SALPINGECTOMY WITH REMOVAL ECOTPIC  08/18/2000   dr Billy Coast   HYSTEROSCOPY WITH NOVASURE N/A 10/30/2016   Procedure: HYSTEROSCOPY WITH NOVASURE;  Surgeon:  Genia Del, MD;  Location: [facility];  Service: Gynecology;  Laterality: N/A;   LAPAROSCOPIC APPENDECTOMY  11/30/2007   SEPTOPLASTY  2011   WISDOM TOOTH EXTRACTION  1996   Patient Active Problem List   Diagnosis Date Noted   Cervical dystonia 02/14/2022   Tremor 02/14/2022   FH: heart disease 08/20/2020   Overweight (BMI 25.0-29.9) 02/23/2019   Essential hypertension 02/23/2019   Vitamin D deficiency 06/28/2015   Depression 06/16/2012   Insomnia 06/12/2012    ONSET DATE: 12/19/2022 MD referral  REFERRING DIAG: G24.3 (ICD-10-CM) - Cervical dystonia   THERAPY DIAG:  Cervicalgia  Abnormal posture  Muscle weakness (generalized)  Rationale for Evaluation and Treatment: Rehabilitation  SUBJECTIVE:  SUBJECTIVE STATEMENT: Noticing the her neck feels better when turning to drive. Reports more of a head tremor with stress and caffeine.    Pt accompanied by: self  PERTINENT HISTORY: cervical dystonia with first Botox injections May 24 (began as head tremor in 2004); have headaches  PAIN:  Are you having pain? Yes: NPRS scale: 3-4/10 Pain location: L neck Pain description: tightness  Aggravating factors: stress Relieving factors: Botox   PRECAUTIONS: None  RED FLAGS: None   WEIGHT BEARING RESTRICTIONS: No  FALLS: Has patient fallen in last 6 months? No  PLOF: Independent and Vocation/Vocational requirements: works as Production designer, theatre/television/film at a desk/computer  PATIENT GOALS: To help to get to the point where I have a regular routine for stretching and exercise.  OBJECTIVE:       TODAY'S TREATMENT: 01/08/23 Activity Comments  Review of initial HEP:  LS stretch 30" chin tucks 10x5" shoulder rolls 10x each side Cueing for larger excursion with rolls. Advised for  longer hold with stretching    Skilled palpation and monitoring of tissues during TPDN Significant soft tissue restriction and TTP in L UT and B cervical paraspinals   Supine scap retraction 10x3"   supine horizontal ABD green TB 10x  Scap retraction with each rep  supine B shoulder ER green TB 10x Scap retraction with each rep     Trigger Point Dry-Needling  Treatment instructions: Expect mild to moderate muscle soreness. S/S of pneumothorax if dry needled over a lung field, and to seek immediate medical attention should they occur. Patient verbalized understanding of these instructions and education.  Patient Consent Given: Yes Education handout provided: Yes Muscles treated: L UT using standard technique  Electrical stimulation performed: No Parameters: N/A Treatment response/outcome: tolerated well; no adverse effects     HOME EXERCISE PROGRAM Last updated: 01/08/23 Access Code: [email or web address] URL: [email or web address] Date: 01/08/2023 Prepared by: [facility] - Outpatient  Rehab - [facility]  Exercises - Gentle Levator Scapulae Stretch  - 1-2 x daily - 7 x weekly - 1 sets - 3-5 reps - 15-30 sec hold - Seated Chin Tuck with Neck Elongation  - 1-2 x daily - 7 x weekly - 1 sets - 5 reps - 10 sec hold - Shoulder Rolls in Sitting  - 1-2 x daily - 7 x weekly - 1-2 sets - 5-10 reps - Seated Upper Trapezius Stretch  - 1 x daily - 5 x weekly - 2 sets - 30 sec hold - Supine Shoulder Horizontal Abduction with Resistance  - 1 x daily - 5 x weekly - 2 sets - 10 reps - Supine Shoulder External Rotation with Resistance  - 1 x daily - 5 x weekly - 2 sets - 10 reps   PATIENT EDUCATION: Education details: edu on DN benefits, precautions, contraindications, post-care , HEP update Person educated: Patient Education method: Explanation, Demonstration, Tactile cues, Verbal cues, and Handouts Education comprehension: verbalized understanding and returned demonstration     Below  measures were taken at time of initial evaluation unless otherwise specified:   DIAGNOSTIC FINDINGS: See below- Per Dr Tat note:   The primary muscles involved are the R SCM, L Winfred and L levator scapulae.  She has pain over the L trapezius as well. (R SCM hypertrophy) COGNITION: Overall cognitive status: Within functional limits for tasks assessed   SENSATION: Occasional tingling R hand  POSTURE: rounded shoulders and head shift to the right head initially appears rotated right, its the sidebend to the left-per MD note  Cervical ROM:  R Rotation  40 L rotation  30 *Tight Flexion  40  *Tense on L Extension  10 Tight on L R sidebending 30 L sidebending 20 *pain with return to midline   Palpation:  Tender to palpation at L upper traps, mild trigger point Tender to palpation at occiput, L > R     PATIENT SURVEYS:  NDI Answers for current level of pain and pre-Botox Item  Score 12/25/22  Score pre-Botox 1   0    4 2   0    0 3   0    0 4   1    2 5   0    3 6   0    1 7   0    1 8   1    2 9    1    3 10    0    2 ------------------------------------------------------------------------    3    18    (6% disability)  (36% disability) TODAY'S TREATMENT:                                                                                                                              DATE: 12/26/2022    PATIENT EDUCATION: Education details: PT eval results, POC; HEP initiated Person educated: Patient Education method: Explanation, Demonstration, and Handouts Education comprehension: verbalized understanding and returned demonstration  HOME EXERCISE PROGRAM:  Access Code: [email or web address] URL: [email or web address] Date: 12/26/2022 Prepared by: [facility] - Outpatient  Rehab - [facility]  Exercises - Gentle Levator Scapulae Stretch  - 1-2 x daily - 7 x weekly - 1 sets - 3-5 reps - 15-30 sec hold - Seated Chin Tuck with Neck Elongation  - 1-2 x daily - 7 x  weekly - 1 sets - 5 reps - 10 sec hold - Shoulder Rolls in Sitting  - 1-2 x daily - 7 x weekly - 1-2 sets - 5-10 reps  GOALS: Goals reviewed with patient? Yes  SHORT TERM GOALS: Target date: 01/23/2023  Pt will be independent with HEP for improved flexibility, pain. Baseline: Goal status: IN PROGRESS  2.  Neck rotation and sidebending to Left to improve by 5 degrees for improved driving motion. Baseline:  Goal status: IN PROGRESS  LONG TERM GOALS: Target date: 03/20/2023  Pt will be independent with HEP for improved neck flexibility, posture, pain. Baseline:  Goal status: IN PROGRESS  2.  Neck rotation and sidebending to Left to improve by 10 degrees for improved painfree range of motion. Baseline:  Goal status: IN PROGRESS  3.  Pt will verbalize posture and positioning techniques to decrease pain during work hours. Baseline:  Goal status: IN PROGRESS  4.  NDI score (at worst) to improve to score of 13 or less. Baseline: initial (worst-pre Botox score 18) Goal status: IN PROGRESS  ASSESSMENT:  CLINICAL IMPRESSION: Patient arrived to session with report of improving neck pain.  Reviewed HEP which required minor cueing for max benefit. After educating pt on DN and receiving verbal consent, patient tolerated DN to L UT. Tolerated this well and without adverse effects. Proceeded with postural strengthening with cueing for proper positioning and form. Patient tolerated session well and without complaints upon leaving.   OBJECTIVE IMPAIRMENTS: increased muscle spasms, impaired flexibility, postural dysfunction, and pain.   ACTIVITY LIMITATIONS: sitting, reach over head, and hygiene/grooming  PARTICIPATION LIMITATIONS: driving, occupation, and computer work  PERSONAL FACTORS: 3+ comorbidities: see above  are also affecting patient's functional outcome.   REHAB POTENTIAL: Good  CLINICAL DECISION MAKING: Stable/uncomplicated  EVALUATION COMPLEXITY: Low  PLAN:  PT  FREQUENCY: 1x/week  PT DURATION: 12 weeks plus eval week  PLANNED INTERVENTIONS: Therapeutic exercises, Therapeutic activity, Neuromuscular re-education, Patient/Family education, Self Care, Joint mobilization, Dry Needling, Electrical stimulation, Cryotherapy, Moist heat, and Manual therapy  PLAN FOR NEXT SESSION: Review  HEP update and progress for stretching, postural strengthening; discuss/pursue dry needling for improved flexibility and decreased pain     Anette Guarneri, PT, DPT 01/08/23 8:45 AM  [facility] [address] [place], [place] [address] Phone # [phone] Fax # [phone]

## 2023-01-08 ENCOUNTER — Encounter: Payer: Self-pay | Admitting: Physical Therapy

## 2023-01-08 ENCOUNTER — Ambulatory Visit: Payer: 59 | Attending: Neurology | Admitting: Physical Therapy

## 2023-01-08 DIAGNOSIS — M542 Cervicalgia: Secondary | ICD-10-CM | POA: Insufficient documentation

## 2023-01-08 DIAGNOSIS — M6281 Muscle weakness (generalized): Secondary | ICD-10-CM | POA: Diagnosis present

## 2023-01-08 DIAGNOSIS — R293 Abnormal posture: Secondary | ICD-10-CM | POA: Diagnosis present

## 2023-01-08 NOTE — Patient Instructions (Signed)

## 2023-01-15 ENCOUNTER — Ambulatory Visit: Payer: 59 | Admitting: Physical Therapy

## 2023-01-19 ENCOUNTER — Ambulatory Visit: Payer: 59 | Admitting: Obstetrics and Gynecology

## 2023-01-22 ENCOUNTER — Ambulatory Visit: Payer: 59 | Admitting: Physical Therapy

## 2023-01-23 ENCOUNTER — Ambulatory Visit: Payer: 59 | Admitting: Physical Therapy

## 2023-01-26 ENCOUNTER — Encounter: Payer: Self-pay | Admitting: Physical Therapy

## 2023-01-26 ENCOUNTER — Ambulatory Visit: Payer: 59 | Admitting: Physical Therapy

## 2023-01-26 DIAGNOSIS — M542 Cervicalgia: Secondary | ICD-10-CM

## 2023-01-26 DIAGNOSIS — M6281 Muscle weakness (generalized): Secondary | ICD-10-CM

## 2023-01-26 DIAGNOSIS — R293 Abnormal posture: Secondary | ICD-10-CM

## 2023-01-29 ENCOUNTER — Ambulatory Visit: Payer: 59 | Admitting: Physical Therapy

## 2023-02-04 ENCOUNTER — Telehealth: Payer: Self-pay | Admitting: Neurology

## 2023-02-04 NOTE — Telephone Encounter (Signed)
Called P[facility]int left voicemail

## 2023-02-04 NOTE — Therapy (Signed)
OUTPATIENT PHYSICAL THERAPY NEURO TREATMENT   Patient Name: Dawn Scott MRN: [id number] DOB:01/29/1971, 52 y.o., female Today's Date: 02/04/2023   PCP: Natalia Leatherwood, DO REFERRING PROVIDER: Vladimir Faster, DO   END OF SESSION:      Past Medical History:  Diagnosis Date   Allergy    Anxiety    Bloody stool 07/07/2016   C. difficile colitis 08/03/2018   Clostridioides difficile infection    Depression    Endometriosis 06/28/2015   GAD (generalized anxiety disorder) 04/01/2018   Hemangioma of skin and subcutaneous tissue 08/21/2021   History of ectopic pregnancy    04/ 2002  S/P  LEFT SALPINGECTOMY   History of endometriosis    History of neoplasm 08/21/2021   History of ovarian cyst    Hypertension    Left ovarian cyst    LGSIL on Pap smear of cervix 08/20/2020   Menorrhagia with regular cycle    PONV (postoperative nausea and vomiting)    Shingles    Uterine fibroid    Past Surgical History:  Procedure Laterality Date   APPENDECTOMY     DILATATION & CURETTAGE/HYSTEROSCOPY WITH MYOSURE N/A 10/30/2016   Procedure: , REMOVAL IUD, HYSTEROSCOPY, DILATION AND CURETTAGE;  Surgeon: Genia Del, MD;  Location: [facility];  Service: Gynecology;  Laterality: N/A;   DILATION AND EVACUATION  03/10/2003   dr Seymour Bars   retained placental tissue postpartum   DX LAPAROSCOPY/ LYSIS ADHESIONAS/  FULGERATION ENDOMETRIOSIS/  LEFT SALPINGECTOMY WITH REMOVAL ECOTPIC  08/18/2000   dr Billy Coast   HYSTEROSCOPY WITH NOVASURE N/A 10/30/2016   Procedure: HYSTEROSCOPY WITH NOVASURE;  Surgeon: Genia Del, MD;  Location: [facility];  Service: Gynecology;  Laterality: N/A;   LAPAROSCOPIC APPENDECTOMY  11/30/2007   SEPTOPLASTY  2011   WISDOM TOOTH EXTRACTION  1996   Patient Active Problem List   Diagnosis Date Noted   Cervical dystonia 02/14/2022   Tremor 02/14/2022   FH: heart disease 08/20/2020   Overweight (BMI 25.0-29.9) 02/23/2019    Essential hypertension 02/23/2019   Vitamin D deficiency 06/28/2015   Depression 06/16/2012   Insomnia 06/12/2012    ONSET DATE: 12/19/2022 MD referral  REFERRING DIAG: G24.3 (ICD-10-CM) - Cervical dystonia   THERAPY DIAG:  No diagnosis found.  Rationale for Evaluation and Treatment: Rehabilitation  SUBJECTIVE:                                                                                                                                                                                             SUBJECTIVE STATEMENT: Missed last 2 appointments- was sick and mother in law passed away.  Has not been consistent with HEP as a result. Saturday went to the driving range and was shoveling gravel over the weekend. Shoulders are really sore. Reports "a good soreness" after DN which went away in a couple days but requests not to try it in the cervical spine.     Pt accompanied by: self  PERTINENT HISTORY: cervical dystonia with first Botox injections May 24 (began as head tremor in 2004); have headaches  PAIN:  Are you having pain? Yes: NPRS scale: 4/10 Pain location: L shoulder Pain description: tightness  Aggravating factors: stress Relieving factors: Botox   PRECAUTIONS: None  RED FLAGS: None   WEIGHT BEARING RESTRICTIONS: No  FALLS: Has patient fallen in last 6 months? No  PLOF: Independent and Vocation/Vocational requirements: works as Production designer, theatre/television/film at a desk/computer  PATIENT GOALS: To help to get to the point where I have a regular routine for stretching and exercise.  OBJECTIVE:     TODAY'S TREATMENT: 02/05/23 Activity Comments                          HOME EXERCISE PROGRAM Access Code: [email or web address] URL: [email or web address] Date: 01/26/2023 Prepared by: [facility] - Outpatient  Rehab - [facility]  Exercises - Gentle Levator Scapulae Stretch  - 1-2 x daily - 7 x weekly - 1 sets - 3-5 reps - 15-30 sec hold - Seated Chin Tuck with  Neck Elongation  - 1-2 x daily - 7 x weekly - 1 sets - 5 reps - 10 sec hold - Shoulder Rolls in Sitting  - 1-2 x daily - 7 x weekly - 1-2 sets - 5-10 reps - Seated Upper Trapezius Stretch  - 1 x daily - 5 x weekly - 2 sets - 30 sec hold - Supine Shoulder Horizontal Abduction with Resistance  - 1 x daily - 5 x weekly - 2 sets - 10 reps - Supine Shoulder External Rotation with Resistance  - 1 x daily - 5 x weekly - 2 sets - 10 reps - Scapular Retraction with Resistance  - 1 x daily - 5 x weekly - 2 sets - 15 reps - Shoulder extension with resistance - Neutral  - 1 x daily - 5 x weekly - 2 sets - 10 reps         Below measures were taken at time of initial evaluation unless otherwise specified:   DIAGNOSTIC FINDINGS: See below- Per Dr Tat note:   The primary muscles involved are the R SCM, L [place] and L levator scapulae.  She has pain over the L trapezius as well. (R SCM hypertrophy) COGNITION: Overall cognitive status: Within functional limits for tasks assessed   SENSATION: Occasional tingling R hand  POSTURE: rounded shoulders and head shift to the right head initially appears rotated right, its the sidebend to the left-per MD note  Cervical ROM:  R Rotation  40 L rotation  30 *Tight Flexion  40  *Tense on L Extension  10 Tight on L R sidebending 30 L sidebending 20 *pain with return to midline   Palpation:  Tender to palpation at L upper traps, mild trigger point Tender to palpation at occiput, L > R     PATIENT SURVEYS:  NDI Answers for current level of pain and pre-Botox Item  Score 12/25/22  Score pre-Botox 1   0    4 2   0    0 3   0    0 4  1    2 5    0    3 6   0    1 7   0    1 8   1    2 9   1    3 10    0    2 ------------------------------------------------------------------------    3    18    (6% disability)  (36% disability) TODAY'S TREATMENT:                                                                                                                               DATE: 12/26/2022    PATIENT EDUCATION: Education details: PT eval results, POC; HEP initiated Person educated: Patient Education method: Explanation, Demonstration, and Handouts Education comprehension: verbalized understanding and returned demonstration  HOME EXERCISE PROGRAM:  Access Code: [email or web address] URL: [email or web address] Date: 12/26/2022 Prepared by: [facility] - Outpatient  Rehab - [facility]  Exercises - Gentle Levator Scapulae Stretch  - 1-2 x daily - 7 x weekly - 1 sets - 3-5 reps - 15-30 sec hold - Seated Chin Tuck with Neck Elongation  - 1-2 x daily - 7 x weekly - 1 sets - 5 reps - 10 sec hold - Shoulder Rolls in Sitting  - 1-2 x daily - 7 x weekly - 1-2 sets - 5-10 reps  GOALS: Goals reviewed with patient? Yes  SHORT TERM GOALS: Target date: 01/23/2023  Pt will be independent with HEP for improved flexibility, pain. Baseline: Goal status: IN PROGRESS  2.  Neck rotation and sidebending to Left to improve by 5 degrees for improved driving motion. Baseline:  Goal status: IN PROGRESS  LONG TERM GOALS: Target date: 03/20/2023  Pt will be independent with HEP for improved neck flexibility, posture, pain. Baseline:  Goal status: IN PROGRESS  2.  Neck rotation and sidebending to Left to improve by 10 degrees for improved painfree range of motion. Baseline:  Goal status: IN PROGRESS  3.  Pt will verbalize posture and positioning techniques to decrease pain during work hours. Baseline:  Goal status: IN PROGRESS  4.  NDI score (at worst) to improve to score of 13 or less. Baseline: initial (worst-pre Botox score 18) Goal status: IN PROGRESS  ASSESSMENT:  CLINICAL IMPRESSION: Patient arrived to session with report of increased L shoulder soreness after going to the driving range and shoveling gravel over the weekend. Reports good tolerance for DN but requests not to try it in the cervical spine. MT focused on trigger  point release to the L periscapular musculature educated on self-STM for home management and followed up with postural strengthening. Cues provided for form. HEP was updated for max benefit. Patient reported understanding and without complaints upon leaving.   OBJECTIVE IMPAIRMENTS: increased muscle spasms, impaired flexibility, postural dysfunction, and pain.   ACTIVITY LIMITATIONS: sitting, reach over head, and hygiene/grooming  PARTICIPATION LIMITATIONS: driving, occupation, and computer work  PERSONAL FACTORS: 3+ comorbidities: see above  are also affecting patient's functional outcome.   REHAB POTENTIAL: Good  CLINICAL DECISION MAKING: Stable/uncomplicated  EVALUATION COMPLEXITY: Low  PLAN:  PT FREQUENCY: 1x/week  PT DURATION: 12 weeks plus eval week  PLANNED INTERVENTIONS: Therapeutic exercises, Therapeutic activity, Neuromuscular re-education, Patient/Family education, Self Care, Joint mobilization, Dry Needling, Electrical stimulation, Cryotherapy, Moist heat, and Manual therapy  PLAN FOR NEXT SESSION: Review  HEP update and progress for stretching, postural strengthening; discuss/pursue dry needling for improved flexibility and decreased pain     Anette Guarneri, PT, DPT 02/04/23 7:48 AM  [facility] [address], [address] [place], [place] [address] Phone # [phone] Fax # [phone]

## 2023-02-04 NOTE — Telephone Encounter (Signed)
-----   Message from [place] Spillman sent at 12/19/2022  2:49 PM EDT ----- Call patient and find out how she did with increased botox dose and with the addition of PT

## 2023-02-05 ENCOUNTER — Ambulatory Visit: Payer: 59 | Attending: Neurology | Admitting: Physical Therapy

## 2023-02-05 ENCOUNTER — Encounter: Payer: Self-pay | Admitting: Physical Therapy

## 2023-02-05 DIAGNOSIS — R293 Abnormal posture: Secondary | ICD-10-CM | POA: Diagnosis present

## 2023-02-05 DIAGNOSIS — M542 Cervicalgia: Secondary | ICD-10-CM | POA: Diagnosis present

## 2023-02-05 DIAGNOSIS — M6281 Muscle weakness (generalized): Secondary | ICD-10-CM | POA: Diagnosis present

## 2023-02-09 NOTE — Progress Notes (Deleted)
GYNECOLOGY  VISIT   HPI: 52 y.o.   Widowed  Caucasian  female   [id number] with No LMP recorded. (Menstrual status: Irregular Periods).   here for   10 week f/u  GYNECOLOGIC HISTORY: No LMP recorded. (Menstrual status: Irregular Periods). Contraception:  abstinence Menopausal hormone therapy:  n/a Last mammogram:  08/30/22 Breast density Cat C, BI-RADS CAT 1 neg  Last pap smear:    01/06/22 neg, 11/06/20 ASCUS: HR HPV neg         OB History     Gravida  2   Para  1   Term      Preterm      AB  1   Living  1      SAB      IAB      Ectopic  1   Multiple      Live Births                 Patient Active Problem List   Diagnosis Date Noted   Cervical dystonia 02/14/2022   Tremor 02/14/2022   FH: heart disease 08/20/2020   Overweight (BMI 25.0-29.9) 02/23/2019   Essential hypertension 02/23/2019   Vitamin D deficiency 06/28/2015   Depression 06/16/2012   Insomnia 06/12/2012    Past Medical History:  Diagnosis Date   Allergy    Anxiety    Bloody stool 07/07/2016   C. difficile colitis 08/03/2018   Clostridioides difficile infection    Depression    Endometriosis 06/28/2015   GAD (generalized anxiety disorder) 04/01/2018   Hemangioma of skin and subcutaneous tissue 08/21/2021   History of ectopic pregnancy    04/ 2002  S/P  LEFT SALPINGECTOMY   History of endometriosis    History of neoplasm 08/21/2021   History of ovarian cyst    Hypertension    Left ovarian cyst    LGSIL on Pap smear of cervix 08/20/2020   Menorrhagia with regular cycle    PONV (postoperative nausea and vomiting)    Shingles    Uterine fibroid     Past Surgical History:  Procedure Laterality Date   APPENDECTOMY     DILATATION & CURETTAGE/HYSTEROSCOPY WITH MYOSURE N/A 10/30/2016   Procedure: , REMOVAL IUD, HYSTEROSCOPY, DILATION AND CURETTAGE;  Surgeon: Genia Del, MD;  Location: [facility];  Service: Gynecology;  Laterality: N/A;   DILATION AND  EVACUATION  03/10/2003   dr Seymour Bars   retained placental tissue postpartum   DX LAPAROSCOPY/ LYSIS ADHESIONAS/  FULGERATION ENDOMETRIOSIS/  LEFT SALPINGECTOMY WITH REMOVAL ECOTPIC  08/18/2000   dr Billy Coast   HYSTEROSCOPY WITH NOVASURE N/A 10/30/2016   Procedure: HYSTEROSCOPY WITH NOVASURE;  Surgeon: Genia Del, MD;  Location: [facility] ;  Service: Gynecology;  Laterality: N/A;   LAPAROSCOPIC APPENDECTOMY  11/30/2007   SEPTOPLASTY  2011   WISDOM TOOTH EXTRACTION  1996    Current Outpatient Medications  Medication Sig Dispense Refill   amLODipine (NORVASC) 5 MG tablet Take 1 tablet (5 mg total) by mouth daily. 90 tablet 1   Biotin 1 MG CAPS Take by mouth.     buPROPion (WELLBUTRIN XL) 300 MG 24 hr tablet TAKE 1 TABLET BY MOUTH DAILY 90 tablet 3   Cholecalciferol (VITAMIN D3) 50 MCG (2000 UT) TABS Take 4,000 Units by mouth every morning. 60 tablet 11   Cyanocobalamin (B-12 PO) Take 1 tablet by mouth every morning.      DULoxetine (CYMBALTA) 30 MG capsule TAKE 1 CAPSULE BY MOUTH DAILY  90 capsule 3   DULoxetine (CYMBALTA) 60 MG capsule TAKE 1 CAPSULE BY MOUTH DAILY 90 capsule 3   estradiol (VIVELLE-DOT) 0.05 MG/24HR patch Place 1 patch (0.05 mg total) onto the skin 2 (two) times a week. 24 patch 0   folic acid (FOLVITE) 1 MG tablet Take 1 mg by mouth daily.     gabapentin (NEURONTIN) 100 MG capsule Take 1 capsule (100 mg) at bedtime.  Take for 3 nights.  If tolerated, increase to 2 capsules (200 mg) at bedtime.  Take for 3 nights.  If tolerated, you may take 3 capsules (300 mg) at bedtime. (Patient not taking: Reported on 12/26/2022) 90 capsule 1   lamoTRIgine (LAMICTAL) 150 MG tablet TAKE 1 TABLET BY MOUTH IN THE  EVENING 90 tablet 3   loratadine (CLARITIN) 10 MG tablet Take 10 mg by mouth every evening.      Multiple Vitamins-Minerals (MULTIVITAMIN WITH MINERALS) tablet Take 1 tablet by mouth every evening.      Probiotic Product (ALIGN PO) Take by mouth.     progesterone  (PROMETRIUM) 100 MG capsule Take 1 capsule (100 mg total) by mouth at bedtime. 90 capsule 0   No current facility-administered medications for this visit.     ALLERGIES: Augmentin [amoxicillin-pot clavulanate]  Family History  Problem Relation Age of Onset   Depression Mother    Arthritis Mother    Heart disease Mother    Transient ischemic attack Mother    Scoliosis Mother    Kyphosis Mother    Alcohol abuse Father    Depression Father    Diabetes Father    Breast cancer Maternal Aunt 9   Early death Maternal Aunt        cancer   Depression Paternal Uncle    Transient ischemic attack Maternal Grandmother    Alcohol abuse Maternal Grandfather    Leukemia Maternal Grandfather    Early death Maternal Grandfather        leukemia   Arthritis Paternal Grandmother    Dementia Paternal Grandmother    Alcohol abuse Paternal Grandfather    Early death Paternal Grandfather    Colon cancer Neg Hx    Colon polyps Neg Hx    Esophageal cancer Neg Hx    Rectal cancer Neg Hx    Stomach cancer Neg Hx     Social History   Socioeconomic History   Marital status: Widowed    Spouse name: Not on file   Number of children: Not on file   Years of education: Not on file   Highest education level: Bachelor's degree (e.g., BA, AB, BS)  Occupational History   Occupation: admin and training work  Tobacco Use   Smoking status: Never   Smokeless tobacco: Never  Vaping Use   Vaping status: Never Used  Substance and Sexual Activity   Alcohol use: Yes    Comment: on special occasions   Drug use: No   Sexual activity: Not Currently    Birth control/protection: Abstinence  Other Topics Concern   Not on file  Social History Narrative   Widower. Husband died on the job 2012/02/24.   Bachelor of fine arts, currently not working    One child, Pharmacist, community.   No tobacco or drug use, rare use of alcohol.   Drinks caffeinated beverages, takes a daily vitamin   Wears her seatbelt, wears a bicycle, smoke  detectors in the home   exercise at least 3 times a week.   Feels safe in her  relationships.   Right handed    Works as Public affairs consultant Strain: Low Risk  (02/14/2022)   Overall Financial Resource Strain (CARDIA)    Difficulty of Paying Living Expenses: Not very hard  Food Insecurity: No Food Insecurity (02/14/2022)   Hunger Vital Sign    Worried About Running Out of Food in the Last Year: Never true    Ran Out of Food in the Last Year: Never true  Transportation Needs: No Transportation Needs (02/14/2022)   PRAPARE - Administrator, Civil Service (Medical): No    Lack of Transportation (Non-Medical): No  Physical Activity: Unknown (02/14/2022)   Exercise Vital Sign    Days of Exercise per Week: 0 days    Minutes of Exercise per Session: Not on file  Stress: No Stress Concern Present (02/14/2022)   [employer] of Occupational Health - Occupational Stress Questionnaire    Feeling of Stress : Only a little  Social Connections: Socially Isolated (02/14/2022)   Social Connection and Isolation Panel [NHANES]    Frequency of Communication with Friends and Family: More than three times a week    Frequency of Social Gatherings with Friends and Family: Three times a week    Attends Religious Services: Never    Active Member of Clubs or Organizations: No    Attends Banker Meetings: Not on file    Marital Status: Widowed  Intimate Partner Violence: Unknown (08/02/2021)   Received from [facility]   HITS    Physically Hurt: Not on file    Insult or Talk Down To: Not on file    Threaten Physical Harm: Not on file    Scream or Curse: Not on file    Review of Systems  PHYSICAL EXAMINATION:    There were no vitals taken for this visit.    General appearance: alert, cooperative and appears stated age Head: Normocephalic, without obvious abnormality, atraumatic Neck: no adenopathy, supple,  symmetrical, trachea midline and thyroid normal to inspection and palpation Lungs: clear to auscultation bilaterally Breasts: normal appearance, no masses or tenderness, No nipple retraction or dimpling, No nipple discharge or bleeding, No axillary or supraclavicular adenopathy Heart: regular rate and rhythm Abdomen: soft, non-tender, no masses,  no organomegaly Extremities: extremities normal, atraumatic, no cyanosis or edema Skin: Skin color, texture, turgor normal. No rashes or lesions Lymph nodes: Cervical, supraclavicular, and axillary nodes normal. No abnormal inguinal nodes palpated Neurologic: Grossly normal  Pelvic: External genitalia:  no lesions              Urethra:  normal appearing urethra with no masses, tenderness or lesions              Bartholins and Skenes: normal                 Vagina: normal appearing vagina with normal color and discharge, no lesions              Cervix: no lesions                Bimanual Exam:  Uterus:  normal size, contour, position, consistency, mobility, non-tender              Adnexa: no mass, fullness, tenderness              Rectal exam: {yes no:[id number]}.  Confirms.              Anus:  normal sphincter tone, no lesions  Chaperone was present for exam:  ***  ASSESSMENT     PLAN     An After Visit Summary was printed and given to the patient.  ______ minutes face to face time of which over 50% was spent in counseling.

## 2023-02-09 NOTE — Telephone Encounter (Signed)
Called patient and asked her how she has been doing on the increased dose of Botox and PT. Patient stated that everything is going great and she loves the PT. Patient stated PT has been teaching her correct ways to stretch when she is having pain and the PT helps relieve her pain. Patient can tell a difference with the increased Botox as well and feels a lot more relaxed. Overall, patient has been doing great.

## 2023-02-09 NOTE — Telephone Encounter (Signed)
Can you follow up on this if she didn't reach back out or send her a mychart message?  Thank you

## 2023-02-12 ENCOUNTER — Ambulatory Visit: Payer: 59

## 2023-02-18 ENCOUNTER — Other Ambulatory Visit: Payer: Self-pay

## 2023-02-18 ENCOUNTER — Ambulatory Visit
Admission: RE | Admit: 2023-02-18 | Discharge: 2023-02-18 | Disposition: A | Discharge status: Home / Self Care | Payer: 59 | Source: Ambulatory Visit | Attending: Family Medicine | Admitting: Family Medicine

## 2023-02-18 VITALS — BP 145/96 | HR 84 | Temp 98.0°F | Resp 18

## 2023-02-18 DIAGNOSIS — J0100 Acute maxillary sinusitis, unspecified: Secondary | ICD-10-CM

## 2023-02-18 DIAGNOSIS — J029 Acute pharyngitis, unspecified: Secondary | ICD-10-CM

## 2023-02-18 MED ORDER — DOXYCYCLINE HYCLATE 100 MG PO CAPS: 100.0000 mg | ORAL_CAPSULE | Freq: Two times a day (BID) | ORAL | 0 refills | Status: AC

## 2023-02-18 MED ORDER — PREDNISONE 20 MG PO TABS: ORAL_TABLET | ORAL | 0 refills | Status: DC

## 2023-02-18 NOTE — ED Triage Notes (Signed)
Pt reports sore throat,fever ,chilld body aches for 10 days.

## 2023-02-18 NOTE — Discharge Instructions (Addendum)
Advised patient to take medications as directed with food to completion.  Advised patient to take Prednisone with first dose of Doxycycline for the next 5 of 7 days.  Encouraged increase in daily water intake to 64 ounces per day while taking these medications.

## 2023-02-18 NOTE — ED Provider Notes (Signed)
[facility]    CSN: [id number] Arrival date & time: 02/18/23  1241      History   Chief Complaint Chief Complaint  Patient presents with   Sore Throat    Sore throat, chills, low grade fever, diarrhea, headache, fatigue on and off for 11 days. - Entered by patient   Fever    HPI Dawn Scott is a 52 y.o. female.   HPI 52 year old female presents with sore throat, chills, low-grade fever, diarrhea, headache, fatigue on and off for 11 days.  PMH significant for C. difficile, obesity, and HTN.  Past Medical History:  Diagnosis Date   Allergy    Anxiety    Bloody stool 07/07/2016   C. difficile colitis 08/03/2018   Clostridioides difficile infection    Depression    Endometriosis 06/28/2015   GAD (generalized anxiety disorder) 04/01/2018   Hemangioma of skin and subcutaneous tissue 08/21/2021   History of ectopic pregnancy    04/ 2002  S/P  LEFT SALPINGECTOMY   History of endometriosis    History of neoplasm 08/21/2021   History of ovarian cyst    Hypertension    Left ovarian cyst    LGSIL on Pap smear of cervix 08/20/2020   Menorrhagia with regular cycle    PONV (postoperative nausea and vomiting)    Shingles    Uterine fibroid     Patient Active Problem List   Diagnosis Date Noted   Cervical dystonia 02/14/2022   Tremor 02/14/2022   FH: heart disease 08/20/2020   Overweight (BMI 25.0-29.9) 02/23/2019   Essential hypertension 02/23/2019   Vitamin D deficiency 06/28/2015   Depression 06/16/2012   Insomnia 06/12/2012    Past Surgical History:  Procedure Laterality Date   APPENDECTOMY     DILATATION & CURETTAGE/HYSTEROSCOPY WITH MYOSURE N/A 10/30/2016   Procedure: , REMOVAL IUD, HYSTEROSCOPY, DILATION AND CURETTAGE;  Surgeon: Genia Del, MD;  Location: [facility];  Service: Gynecology;  Laterality: N/A;   DILATION AND EVACUATION  03/10/2003   dr Seymour Bars   retained placental tissue postpartum   DX LAPAROSCOPY/ LYSIS  ADHESIONAS/  FULGERATION ENDOMETRIOSIS/  LEFT SALPINGECTOMY WITH REMOVAL ECOTPIC  08/18/2000   dr Billy Coast   HYSTEROSCOPY WITH NOVASURE N/A 10/30/2016   Procedure: HYSTEROSCOPY WITH NOVASURE;  Surgeon: Genia Del, MD;  Location: [facility];  Service: Gynecology;  Laterality: N/A;   LAPAROSCOPIC APPENDECTOMY  11/30/2007   SEPTOPLASTY  2011   WISDOM TOOTH EXTRACTION  1996    OB History     Gravida  2   Para  1   Term      Preterm      AB  1   Living  1      SAB      IAB      Ectopic  1   Multiple      Live Births               Home Medications    Prior to Admission medications   Medication Sig Start Date End Date Taking? Authorizing Provider  doxycycline (VIBRAMYCIN) 100 MG capsule Take 1 capsule (100 mg total) by mouth 2 (two) times daily for 7 days. 02/18/23 02/25/23 Yes Trevor Iha, FNP  predniSONE (DELTASONE) 20 MG tablet Take 3 tabs PO daily x 5 days. 02/18/23  Yes Trevor Iha, FNP  amLODipine (NORVASC) 5 MG tablet Take 1 tablet (5 mg total) by mouth daily. 10/31/22   Kuneff, Renee A, DO  Biotin  1 MG CAPS Take by mouth.    [provider]  buPROPion (WELLBUTRIN XL) 300 MG 24 hr tablet TAKE 1 TABLET BY MOUTH DAILY 12/03/22   Cherie Ouch, PA-C  Cholecalciferol (VITAMIN D3) 50 MCG (2000 UT) TABS Take 4,000 Units by mouth every morning. 12/07/20   Melony Overly T, PA-C  Cyanocobalamin (B-12 PO) Take 1 tablet by mouth every morning.     [provider]  DULoxetine (CYMBALTA) 30 MG capsule TAKE 1 CAPSULE BY MOUTH DAILY 11/23/22   Melony Overly T, PA-C  DULoxetine (CYMBALTA) 60 MG capsule TAKE 1 CAPSULE BY MOUTH DAILY 11/23/22   Melony Overly T, PA-C  estradiol (VIVELLE-DOT) 0.05 MG/24HR patch Place 1 patch (0.05 mg total) onto the skin 2 (two) times a week. 12/11/22   Patton Salles, MD  folic acid (FOLVITE) 1 MG tablet Take 1 mg by mouth daily.    [provider]  gabapentin (NEURONTIN) 100 MG capsule  Take 1 capsule (100 mg) at bedtime.  Take for 3 nights.  If tolerated, increase to 2 capsules (200 mg) at bedtime.  Take for 3 nights.  If tolerated, you may take 3 capsules (300 mg) at bedtime. Patient not taking: Reported on 12/26/2022 11/28/22   Patton Salles, MD  lamoTRIgine (LAMICTAL) 150 MG tablet TAKE 1 TABLET BY MOUTH IN THE  EVENING 10/22/22   Melony Overly T, PA-C  loratadine (CLARITIN) 10 MG tablet Take 10 mg by mouth every evening.     [provider]  Multiple Vitamins-Minerals (MULTIVITAMIN WITH MINERALS) tablet Take 1 tablet by mouth every evening.     [provider]  Probiotic Product (ALIGN PO) Take by mouth.    [provider]  progesterone (PROMETRIUM) 100 MG capsule Take 1 capsule (100 mg total) by mouth at bedtime. 12/10/22   Patton Salles, MD    Family History Family History  Problem Relation Age of Onset   Depression Mother    Arthritis Mother    Heart disease Mother    Transient ischemic attack Mother    Scoliosis Mother    Kyphosis Mother    Alcohol abuse Father    Depression Father    Diabetes Father    Breast cancer Maternal Aunt 61   Early death Maternal Aunt        cancer   Depression Paternal Uncle    Transient ischemic attack Maternal Grandmother    Alcohol abuse Maternal Grandfather    Leukemia Maternal Grandfather    Early death Maternal Grandfather        leukemia   Arthritis Paternal Grandmother    Dementia Paternal Grandmother    Alcohol abuse Paternal Grandfather    Early death Paternal Grandfather    Colon cancer Neg Hx    Colon polyps Neg Hx    Esophageal cancer Neg Hx    Rectal cancer Neg Hx    Stomach cancer Neg Hx     Social History Social History   Tobacco Use   Smoking status: Never   Smokeless tobacco: Never  Vaping Use   Vaping status: Never Used  Substance Use Topics   Alcohol use: Yes    Comment: on special occasions   Drug use: No     Allergies   Augmentin  [amoxicillin-pot clavulanate]   Review of Systems Review of Systems  Constitutional:  Positive for chills, fatigue and fever.  HENT:  Positive for sore throat.   Musculoskeletal:  Positive for arthralgias and myalgias.  Neurological:  Positive for headaches.  All other systems reviewed and are negative.    Physical Exam Triage Vital Signs ED Triage Vitals  Encounter Vitals Group     BP      Systolic BP Percentile      Diastolic BP Percentile      Pulse      Resp      Temp      Temp src      SpO2      Weight      Height      Head Circumference      Peak Flow      Pain Score      Pain Loc      Pain Education      Exclude from Growth Chart    No data found.  Updated Vital Signs BP (!) 145/96   Pulse 84   Temp 98 F (36.7 C)   Resp 18   LMP 06/05/2022 Comment: Very light  SpO2 100%   Physical Exam Vitals and nursing note reviewed.  Constitutional:      Appearance: Normal appearance. She is well-developed. She is obese. She is not ill-appearing.  HENT:     Head: Normocephalic and atraumatic.     Right Ear: Tympanic membrane and external ear normal.     Left Ear: Tympanic membrane and external ear normal.     Ears:     Comments: Significant eustachian tube dysfunction noted bilaterally    Nose:     Comments: Turbinates are erythematous/edematous    Mouth/Throat:     Mouth: Mucous membranes are moist.     Pharynx: Oropharynx is clear. Uvula midline. No posterior oropharyngeal erythema or uvula swelling.  Eyes:     Extraocular Movements: Extraocular movements intact.     Conjunctiva/sclera: Conjunctivae normal.     Pupils: Pupils are equal, round, and reactive to light.  Neck:     Vascular: No carotid bruit.  Cardiovascular:     Rate and Rhythm: Normal rate and regular rhythm.     Pulses: Normal pulses.     Heart sounds: Normal heart sounds. No murmur heard. Pulmonary:     Effort: Pulmonary effort is normal.     Breath sounds: Normal breath sounds. No  wheezing, rhonchi or rales.  Musculoskeletal:        General: Normal range of motion.     Cervical back: Normal range of motion and neck supple.  Lymphadenopathy:     Cervical: No cervical adenopathy.  Skin:    General: Skin is warm and dry.  Neurological:     General: No focal deficit present.     Mental Status: She is alert and oriented to person, place, and time.  Psychiatric:        Mood and Affect: Mood normal.        Behavior: Behavior normal.      UC Treatments / Results  Labs (all labs ordered are listed, but only abnormal results are displayed) Labs Reviewed - No data to display  EKG   Radiology No results found.  Procedures Procedures (including critical care time)  Medications Ordered in UC Medications - No data to display  Initial Impression / Assessment and Plan / UC Course  I have reviewed the triage vital signs and the nursing notes.  Pertinent labs & imaging results that were available during my care of the patient were reviewed by me and considered in my medical  decision making (see chart for details).     MDM: 1.  Subacute maxillary sinusitis-Rx'd Doxycycline 100 mg capsule: Take 1 capsule twice daily x 7 days; 2.  Sore throat-Rx'd Prednisone 60 mg. Advised patient to take medications as directed with food to completion.  Advised patient to take Prednisone with first dose of Doxycycline for the next 5 of 7 days.  Encouraged increase in daily water intake to 64 ounces per day while taking these medications.  Patient discharged home, hemodynamically stable.  Patient discharged home, hemodynamically stable. Final Clinical Impressions(s) / UC Diagnoses   Final diagnoses:  Sore throat  Subacute maxillary sinusitis     Discharge Instructions      Advised patient to take medications as directed with food to completion.  Advised patient to take Prednisone with first dose of Doxycycline for the next 5 of 7 days.  Encouraged increase in daily water intake  to 64 ounces per day while taking these medications.     ED Prescriptions     Medication Sig Dispense Auth. Provider   doxycycline (VIBRAMYCIN) 100 MG capsule Take 1 capsule (100 mg total) by mouth 2 (two) times daily for 7 days. 14 capsule Trevor Iha, FNP   predniSONE (DELTASONE) 20 MG tablet Take 3 tabs PO daily x 5 days. 15 tablet Trevor Iha, FNP      PDMP not reviewed this encounter.   Trevor Iha, FNP 02/18/23 1338

## 2023-02-19 ENCOUNTER — Ambulatory Visit: Payer: 59 | Admitting: Physical Therapy

## 2023-02-22 ENCOUNTER — Other Ambulatory Visit: Payer: Self-pay | Admitting: Obstetrics and Gynecology

## 2023-02-22 DIAGNOSIS — Z79890 Hormone replacement therapy (postmenopausal): Secondary | ICD-10-CM

## 2023-02-23 ENCOUNTER — Ambulatory Visit: Payer: 59 | Admitting: Obstetrics and Gynecology

## 2023-02-23 NOTE — Telephone Encounter (Signed)
Med refill request: Estrogen Patches Last AEX: 01/06/2022-ML Next AEX: 06/16/2023-BS Last MMG (if hormonal med): 08/30/2022-neg birads 1 Refill authorized: rx pend.  Per encounter dated 12/07/2022-Dr. Edward Jolly desired pt to f/u in 10 weeks after starting HRT. Pt was scheduled for 01/19/2023 but cancelled appt and r/s for 02/23/2023 (this appt was cancelled also due to pt reported feeling better and that appt was no longer needed).   Please advise.

## 2023-02-25 NOTE — Telephone Encounter (Signed)
Msg sent to appt desk.  

## 2023-02-26 ENCOUNTER — Ambulatory Visit: Payer: 59 | Admitting: Physical Therapy

## 2023-02-26 NOTE — Telephone Encounter (Signed)
Pt now scheduled for 03/03/2023.

## 2023-03-01 ENCOUNTER — Other Ambulatory Visit: Payer: Self-pay | Admitting: Obstetrics and Gynecology

## 2023-03-01 DIAGNOSIS — Z79890 Hormone replacement therapy (postmenopausal): Secondary | ICD-10-CM

## 2023-03-03 ENCOUNTER — Encounter: Payer: Self-pay | Admitting: Obstetrics and Gynecology

## 2023-03-03 ENCOUNTER — Ambulatory Visit (INDEPENDENT_AMBULATORY_CARE_PROVIDER_SITE_OTHER): Payer: 59 | Admitting: Obstetrics and Gynecology

## 2023-03-03 VITALS — BP 132/72 | HR 87 | Ht 69.0 in | Wt 195.0 lb

## 2023-03-03 DIAGNOSIS — Z5181 Encounter for therapeutic drug level monitoring: Secondary | ICD-10-CM | POA: Diagnosis not present

## 2023-03-03 DIAGNOSIS — Z79890 Hormone replacement therapy (postmenopausal): Secondary | ICD-10-CM

## 2023-03-03 MED ORDER — ESTRADIOL 0.05 MG/24HR TD PTTW: 1.0000 | MEDICATED_PATCH | TRANSDERMAL | 1 refills | Status: DC

## 2023-03-03 MED ORDER — PROGESTERONE MICRONIZED 100 MG PO CAPS: 100.0000 mg | ORAL_CAPSULE | Freq: Every day | ORAL | 1 refills | Status: DC

## 2023-03-03 NOTE — Progress Notes (Unsigned)
GYNECOLOGY  VISIT   HPI: 52 y.o.   Widowed  Caucasian female   G2P0011 with Patient's last menstrual period was 06/05/2022.   here for: medication monitoring for hormone therapy.    No more hot flashes, night sweats.  Memory returned.  Not fatigued.  Not having cravings.   Passed a tiny piece of dark red tissue a couple of days ago.  No other bleeding.  Has had an ablation.   Not taking gabapentin.  Took it once and it caused dizziness.   Has annual exam in Feb. 2025.  GYNECOLOGIC HISTORY: Patient's last menstrual period was 06/05/2022. Contraception:  abstinence Menopausal hormone therapy:  estradiol patch Pap:      Component Value Date/Time   DIAGPAP  01/06/2022 1449    - Negative for Intraepithelial Lesions or Malignancy (NILM)   DIAGPAP - Benign reactive/reparative changes 01/06/2022 1449   DIAGPAP (A) 11/06/2020 1229    - Atypical squamous cells of undetermined significance (ASC-US)   HPVHIGH Negative 11/06/2020 1229   ADEQPAP  01/06/2022 1449    Satisfactory for evaluation; transformation zone component PRESENT.   ADEQPAP  11/06/2020 1229    Satisfactory for evaluation; transformation zone component PRESENT.   History of abnormal Pap or positive HPV:  yes Mammogram:   08/30/22 Breast density Cat C, BI-RADS CAT 1 neg         OB History     Gravida  2   Para  1   Term      Preterm      AB  1   Living  1      SAB      IAB      Ectopic  1   Multiple      Live Births                 Patient Active Problem List   Diagnosis Date Noted   Cervical dystonia 02/14/2022   Tremor 02/14/2022   FH: heart disease 08/20/2020   Overweight (BMI 25.0-29.9) 02/23/2019   Essential hypertension 02/23/2019   Vitamin D deficiency 06/28/2015   Depression 06/16/2012   Insomnia 06/12/2012    Past Medical History:  Diagnosis Date   Allergy    Anxiety    Bloody stool 07/07/2016   C. difficile colitis 08/03/2018   Clostridioides difficile infection     Depression    Endometriosis 06/28/2015   GAD (generalized anxiety disorder) 04/01/2018   Hemangioma of skin and subcutaneous tissue 08/21/2021   History of ectopic pregnancy    04/ 2002  S/P  LEFT SALPINGECTOMY   History of endometriosis    History of neoplasm 08/21/2021   History of ovarian cyst    Hypertension    Left ovarian cyst    LGSIL on Pap smear of cervix 08/20/2020   Menorrhagia with regular cycle    PONV (postoperative nausea and vomiting)    Shingles    Uterine fibroid     Past Surgical History:  Procedure Laterality Date   APPENDECTOMY     DILATATION & CURETTAGE/HYSTEROSCOPY WITH MYOSURE N/A 10/30/2016   Procedure: , REMOVAL IUD, HYSTEROSCOPY, DILATION AND CURETTAGE;  Surgeon: Genia Del, MD;  Location: [facility];  Service: Gynecology;  Laterality: N/A;   DILATION AND EVACUATION  03/10/2003   dr Seymour Bars   retained placental tissue postpartum   DX LAPAROSCOPY/ LYSIS ADHESIONAS/  FULGERATION ENDOMETRIOSIS/  LEFT SALPINGECTOMY WITH REMOVAL ECOTPIC  08/18/2000   dr Billy Coast   HYSTEROSCOPY WITH NOVASURE N/A  10/30/2016   Procedure: HYSTEROSCOPY WITH NOVASURE;  Surgeon: Genia Del, MD;  Location: [facility];  Service: Gynecology;  Laterality: N/A;   LAPAROSCOPIC APPENDECTOMY  11/30/2007   SEPTOPLASTY  2011   WISDOM TOOTH EXTRACTION  1996    Current Outpatient Medications  Medication Sig Dispense Refill   amLODipine (NORVASC) 5 MG tablet Take 1 tablet (5 mg total) by mouth daily. 90 tablet 1   Biotin 1 MG CAPS Take by mouth.     buPROPion (WELLBUTRIN XL) 300 MG 24 hr tablet TAKE 1 TABLET BY MOUTH DAILY 90 tablet 3   Cholecalciferol (VITAMIN D3) 50 MCG (2000 UT) TABS Take 4,000 Units by mouth every morning. 60 tablet 11   Cyanocobalamin (B-12 PO) Take 1 tablet by mouth every morning.      DULoxetine (CYMBALTA) 30 MG capsule TAKE 1 CAPSULE BY MOUTH DAILY 90 capsule 3   DULoxetine (CYMBALTA) 60 MG capsule TAKE 1 CAPSULE BY  MOUTH DAILY 90 capsule 3   folic acid (FOLVITE) 1 MG tablet Take 1 mg by mouth daily.     lamoTRIgine (LAMICTAL) 150 MG tablet TAKE 1 TABLET BY MOUTH IN THE  EVENING 90 tablet 3   loratadine (CLARITIN) 10 MG tablet Take 10 mg by mouth every evening.      Multiple Vitamins-Minerals (MULTIVITAMIN WITH MINERALS) tablet Take 1 tablet by mouth every evening.      predniSONE (DELTASONE) 20 MG tablet Take 3 tabs PO daily x 5 days. 15 tablet 0   Probiotic Product (ALIGN PO) Take by mouth.     [START ON 03/05/2023] estradiol (VIVELLE-DOT) 0.05 MG/24HR patch Place 1 patch (0.05 mg total) onto the skin 2 (two) times a week. 24 patch 1   progesterone (PROMETRIUM) 100 MG capsule Take 1 capsule (100 mg total) by mouth at bedtime. 90 capsule 1   No current facility-administered medications for this visit.     ALLERGIES: Augmentin [amoxicillin-pot clavulanate]  Family History  Problem Relation Age of Onset   Depression Mother    Arthritis Mother    Heart disease Mother    Transient ischemic attack Mother    Scoliosis Mother    Kyphosis Mother    Alcohol abuse Father    Depression Father    Diabetes Father    Breast cancer Maternal Aunt 34   Early death Maternal Aunt        cancer   Depression Paternal Uncle    Transient ischemic attack Maternal Grandmother    Alcohol abuse Maternal Grandfather    Leukemia Maternal Grandfather    Early death Maternal Grandfather        leukemia   Arthritis Paternal Grandmother    Dementia Paternal Grandmother    Alcohol abuse Paternal Grandfather    Early death Paternal Grandfather    Colon cancer Neg Hx    Colon polyps Neg Hx    Esophageal cancer Neg Hx    Rectal cancer Neg Hx    Stomach cancer Neg Hx     Social History   Socioeconomic History   Marital status: Widowed    Spouse name: Not on file   Number of children: Not on file   Years of education: Not on file   Highest education level: Bachelor's degree (e.g., BA, AB, BS)  Occupational  History   Occupation: admin and training work  Tobacco Use   Smoking status: Never   Smokeless tobacco: Never  Vaping Use   Vaping status: Never Used  Substance and Sexual  Activity   Alcohol use: Yes    Comment: on special occasions   Drug use: No   Sexual activity: Not Currently    Birth control/protection: Abstinence  Other Topics Concern   Not on file  Social History Narrative   Widower. Husband died on the job March 13, 2012.   Bachelor of fine arts, currently not working    One child, Pharmacist, community.   No tobacco or drug use, rare use of alcohol.   Drinks caffeinated beverages, takes a daily vitamin   Wears her seatbelt, wears a bicycle, smoke detectors in the home   exercise at least 3 times a week.   Feels safe in her relationships.   Right handed    Works as Public affairs consultant Strain: Low Risk  (02/14/2022)   Overall Financial Resource Strain (CARDIA)    Difficulty of Paying Living Expenses: Not very hard  Food Insecurity: No Food Insecurity (02/14/2022)   Hunger Vital Sign    Worried About Running Out of Food in the Last Year: Never true    Ran Out of Food in the Last Year: Never true  Transportation Needs: No Transportation Needs (02/14/2022)   PRAPARE - Administrator, Civil Service (Medical): No    Lack of Transportation (Non-Medical): No  Physical Activity: Unknown (02/14/2022)   Exercise Vital Sign    Days of Exercise per Week: 0 days    Minutes of Exercise per Session: Not on file  Stress: No Stress Concern Present (02/14/2022)   [employer] of Occupational Health - Occupational Stress Questionnaire    Feeling of Stress : Only a little  Social Connections: Socially Isolated (02/14/2022)   Social Connection and Isolation Panel [NHANES]    Frequency of Communication with Friends and Family: More than three times a week    Frequency of Social Gatherings with Friends and Family: Three times a week    Attends  Religious Services: Never    Active Member of Clubs or Organizations: No    Attends Banker Meetings: Not on file    Marital Status: Widowed  Intimate Partner Violence: Unknown (08/02/2021)   Received from [facility]   HITS    Physically Hurt: Not on file    Insult or Talk Down To: Not on file    Threaten Physical Harm: Not on file    Scream or Curse: Not on file    Review of Systems  All other systems reviewed and are negative.   PHYSICAL EXAMINATION:   BP 132/72 (BP Location: Left Arm, Patient Position: Sitting, Cuff Size: Normal)   Pulse 87   Ht 5\' 9"  (1.753 m)   Wt 195 lb (88.5 kg)   LMP 06/05/2022 Comment: Very light  SpO2 98%   BMI 28.80 kg/m     General appearance: alert, cooperative and appears stated age  ASSESSMENT AND PLAN  1. Hormone replacement therapy Menopause symptoms controlled on current HRT regimen, which she will continue.  - estradiol (VIVELLE-DOT) 0.05 MG/24HR patch; Place 1 patch (0.05 mg total) onto the skin 2 (two) times a week.  Dispense: 24 patch; Refill: 1 - progesterone (PROMETRIUM) 100 MG capsule; Take 1 capsule (100 mg total) by mouth at bedtime.  Dispense: 90 capsule; Refill: 1  2. Encounter for medication monitoring  Menopause symptoms controlled on current HRT regimen, which she will continue.   Return in February for annual exam.  Call for any future vaginal bleeding.  28 min  total time was spent for this patient encounter, including preparation, face-to-face counseling with the patient, coordination of care, and documentation of the encounter.  An After Visit Summary was provided to the patient.

## 2023-03-20 ENCOUNTER — Ambulatory Visit (INDEPENDENT_AMBULATORY_CARE_PROVIDER_SITE_OTHER): Payer: 59 | Admitting: Neurology

## 2023-03-20 DIAGNOSIS — G243 Spasmodic torticollis: Secondary | ICD-10-CM | POA: Diagnosis not present

## 2023-03-20 MED ORDER — ONABOTULINUMTOXINA 100 UNITS IJ SOLR
300.0000 [IU] | Freq: Once | INTRAMUSCULAR | Status: AC
  Administered 2023-03-20: 270 [IU] via INTRAMUSCULAR

## 2023-03-20 NOTE — Progress Notes (Signed)
[facility]   Procedure Note Botox  Attending: Dr. Lurena Joiner Dyke Weible  Preoperative Diagnosis(es): Cervical Dystonia  Result History  Had some mild dysphagia last time but the botox did help more.  Dose adjusted today  Consent obtained from: The patient Benefits discussed included, but were not limited to decreased muscle tightness, increased joint range of motion, and decreased pain.  Risk discussed included, but were not limited pain and discomfort, bleeding, bruising, excessive weakness, venous thrombosis, muscle atrophy and dysphagia.  A copy of the patient medication guide was given to the patient which explains the blackbox warning.  Patients identity and treatment sites confirmed Yes.  .  Details of Procedure: Skin was cleaned with alcohol.  A 30 gauge, 25mm  needle was introduced to the target muscle, except for posterior splenius where 27 gauge, 1.5 inch needle used.   Prior to injection, the needle plunger was aspirated to make sure the needle was not within a blood vessel.  There was no blood retrieved on aspiration.    Following is a summary of the muscles injected  And the amount of Botulinum toxin used:   Dilution 0.9% preservative free saline mixed with 100 u Botox type A to make 10 U per 0.1cc  Injections  Location Left  Right Units Number of sites        Sternocleidomastoid  70 70 1  Splenius Capitus, posterior approach 100  100 1  Splenius Capitus, lateral approach 30  30 1   Levator Scapulae 10  10 1   Trapezius 20 x 3  60 3        TOTAL UNITS:   270    Agent: Botulinum Type A ( Onobotulinum Toxin type A ).  3 vials of Botox were used, each containing 100 units and freshly diluted with 1 mL of sterile, non-preserved saline   Total injected (Units): 270  Total wasted (Units): 30   Pt tolerated procedure well without complications.   Reinjection is anticipated in 3 months.

## 2023-04-08 ENCOUNTER — Ambulatory Visit: Payer: 59 | Admitting: Obstetrics and Gynecology

## 2023-04-14 NOTE — Progress Notes (Unsigned)
Patient ID: Dawn Scott, female  DOB: 1970/06/25, 52 y.o.   MRN: [id number] Patient Care Team    Relationship Specialty Notifications Start End  Natalia Leatherwood, DO PCP - General Family Medicine  06/28/15   Genia Del, MD Consulting Physician Obstetrics and Gynecology  06/28/15   Rachael Fee, MD Attending Physician Gastroenterology  08/21/21   Delight Ovens, LCSW Social Worker Psychology  08/21/21     No chief complaint on file.   Subjective: Dawn Scott is a 52 y.o.  Female  present for Chronic Conditions/illness Management  All past medical history, surgical history, allergies, family history, immunizations, medications and social history were updated in the electronic medical record today. All recent labs, ED visits and hospitalizations within the last year were reviewed.   HTN/obesity/Fhx HD-mother: Pt reports compliance with amlodipine 5 mg QD. Blood pressures ranges at home normal. Patient denies chest pain, shortness of breath, dizziness or lower extremity edema.  Pt does not daily baby ASA. Pt is not prescribed statin. RF: htn, obesity, FHX HD mother.        05/12/2022    1:16 PM 08/21/2021    3:24 PM 09/04/2020   11:07 AM 08/20/2020    9:20 AM 02/23/2019    1:01 PM  Depression screen PHQ 2/9  Decreased Interest 1 1 0 1 3  Down, Depressed, Hopeless 1 0 0 1 0  PHQ - 2 Score 2 1 0 2 3  Altered sleeping 0 1  1 0  Tired, decreased energy 0 2  3 0  Change in appetite 0 1  0 0  Feeling bad or failure about yourself  0 0  0 0  Trouble concentrating 2 1  2  0  Moving slowly or fidgety/restless 0 0  0 0  Suicidal thoughts 0 0  0 0  PHQ-9 Score 4 6  8 3   Difficult doing work/chores     Not difficult at all      05/12/2022    1:16 PM 08/21/2021    3:24 PM 08/20/2020    9:20 AM 06/28/2015    2:17 PM  GAD 7 : Generalized Anxiety Score  Nervous, Anxious, on Edge 0 1 0 0  Control/stop worrying 0 0 0 0  Worry too much - different things 0 0 0 0  Trouble  relaxing 0 0 0 0  Restless 0 0 0   Easily annoyed or irritable 1 2 1  0  Afraid - awful might happen 0 0 0 0  Total GAD 7 Score 1 3 1             Immunization History  Administered Date(s) Administered   Influenza,inj,Quad PF,6+ Mos 02/04/2013, 01/18/2014, 01/22/2015, 02/04/2022   Influenza-Unspecified 02/11/2021   PFIZER(Purple Top)SARS-COV-2 Vaccination 07/14/2019, 08/08/2019, 05/04/2020   Pfizer Covid-19 Vaccine Bivalent Booster 32yrs & up 02/01/2021   Pfizer(Comirnaty)Fall Seasonal Vaccine 12 years and older 01/01/2023   Tdap 11/24/2014   Unspecified SARS-COV-2 Vaccination 02/04/2022   Zoster Recombinant(Shingrix) 03/28/2022, 09/11/2022     Past Medical History:  Diagnosis Date   Allergy    Anxiety    Bloody stool 07/07/2016   C. difficile colitis 08/03/2018   Clostridioides difficile infection    Depression    Endometriosis 06/28/2015   GAD (generalized anxiety disorder) 04/01/2018   Hemangioma of skin and subcutaneous tissue 08/21/2021   History of ectopic pregnancy    04/ 2002  S/P  LEFT SALPINGECTOMY   History of endometriosis  History of neoplasm 08/21/2021   History of ovarian cyst    Hypertension    Left ovarian cyst    LGSIL on Pap smear of cervix 08/20/2020   Menorrhagia with regular cycle    PONV (postoperative nausea and vomiting)    Shingles    Uterine fibroid    Allergies  Allergen Reactions   Augmentin [Amoxicillin-Pot Clavulanate] Other (See Comments)    Diarrhea C.diff positive.   Past Surgical History:  Procedure Laterality Date   APPENDECTOMY     DILATATION & CURETTAGE/HYSTEROSCOPY WITH MYOSURE N/A 10/30/2016   Procedure: , REMOVAL IUD, HYSTEROSCOPY, DILATION AND CURETTAGE;  Surgeon: Genia Del, MD;  Location: [facility];  Service: Gynecology;  Laterality: N/A;   DILATION AND EVACUATION  03/10/2003   dr Seymour Bars   retained placental tissue postpartum   DX LAPAROSCOPY/ LYSIS ADHESIONAS/  FULGERATION ENDOMETRIOSIS/   LEFT SALPINGECTOMY WITH REMOVAL ECOTPIC  08/18/2000   dr Billy Coast   HYSTEROSCOPY WITH NOVASURE N/A 10/30/2016   Procedure: HYSTEROSCOPY WITH NOVASURE;  Surgeon: Genia Del, MD;  Location: [facility];  Service: Gynecology;  Laterality: N/A;   LAPAROSCOPIC APPENDECTOMY  11/30/2007   SEPTOPLASTY  04/26/2010   WISDOM TOOTH EXTRACTION  1996   Family History  Problem Relation Age of Onset   Depression Mother    Arthritis Mother    Heart disease Mother    Transient ischemic attack Mother    Scoliosis Mother    Kyphosis Mother    Alcohol abuse Father    Depression Father    Diabetes Father    Breast cancer Maternal Aunt 66   Early death Maternal Aunt        cancer   Depression Paternal Uncle    Transient ischemic attack Maternal Grandmother    Alcohol abuse Maternal Grandfather    Leukemia Maternal Grandfather    Early death Maternal Grandfather        leukemia   Arthritis Paternal Grandmother    Dementia Paternal Grandmother    Alcohol abuse Paternal Grandfather    Early death Paternal Grandfather    Colon cancer Neg Hx    Colon polyps Neg Hx    Esophageal cancer Neg Hx    Rectal cancer Neg Hx    Stomach cancer Neg Hx    Social History   Social History Narrative   Widower. Husband died on the job 26-Apr-2012.   Bachelor of fine arts, currently not working    One child, Pharmacist, community.   No tobacco or drug use, rare use of alcohol.   Drinks caffeinated beverages, takes a daily vitamin   Wears her seatbelt, wears a bicycle, smoke detectors in the home   exercise at least 3 times a week.   Feels safe in her relationships.   Right handed    Works as Corporate treasurer    Allergies as of 04/15/2023       Reactions   Augmentin [amoxicillin-pot Clavulanate] Other (See Comments)   Diarrhea C.diff positive.        Medication List        Accurate as of April 14, 2023 12:52 PM. If you have any questions, ask your nurse or doctor.          ALIGN PO Take by mouth.    amLODipine 5 MG tablet Commonly known as: NORVASC Take 1 tablet (5 mg total) by mouth daily.   B-12 PO Take 1 tablet by mouth every morning.   Biotin 1 MG Caps Take by mouth.  buPROPion 300 MG 24 hr tablet Commonly known as: WELLBUTRIN XL TAKE 1 TABLET BY MOUTH DAILY   DULoxetine 60 MG capsule Commonly known as: CYMBALTA TAKE 1 CAPSULE BY MOUTH DAILY   DULoxetine 30 MG capsule Commonly known as: CYMBALTA TAKE 1 CAPSULE BY MOUTH DAILY   estradiol 0.05 MG/24HR patch Commonly known as: VIVELLE-DOT Place 1 patch (0.05 mg total) onto the skin 2 (two) times a week.   folic acid 1 MG tablet Commonly known as: FOLVITE Take 1 mg by mouth daily.   lamoTRIgine 150 MG tablet Commonly known as: LAMICTAL TAKE 1 TABLET BY MOUTH IN THE  EVENING   loratadine 10 MG tablet Commonly known as: CLARITIN Take 10 mg by mouth every evening.   multivitamin with minerals tablet Take 1 tablet by mouth every evening.   predniSONE 20 MG tablet Commonly known as: DELTASONE Take 3 tabs PO daily x 5 days.   progesterone 100 MG capsule Commonly known as: Prometrium Take 1 capsule (100 mg total) by mouth at bedtime.   Vitamin D3 50 MCG (2000 UT) Tabs Take 4,000 Units by mouth every morning.        All past medical history, surgical history, allergies, family history, immunizations andmedications were updated in the EMR today and reviewed under the history and medication portions of their EMR.     No results found for this or any previous visit (from the past 2160 hours).   No results found.   ROS 14 pt review of systems performed and negative (unless mentioned in an HPI)  Objective: LMP 06/05/2022 Comment: Very light Physical Exam Vitals and nursing note reviewed.  Constitutional:      General: She is not in acute distress.    Appearance: Normal appearance. She is not ill-appearing, toxic-appearing or diaphoretic.  HENT:     Head: Normocephalic and atraumatic.  Eyes:      General: No scleral icterus.       Right eye: No discharge.        Left eye: No discharge.     Extraocular Movements: Extraocular movements intact.     Conjunctiva/sclera: Conjunctivae normal.     Pupils: Pupils are equal, round, and reactive to light.  Cardiovascular:     Rate and Rhythm: Normal rate and regular rhythm.  Pulmonary:     Effort: Pulmonary effort is normal. No respiratory distress.     Breath sounds: Normal breath sounds. No wheezing, rhonchi or rales.  Musculoskeletal:     Right lower leg: No edema.     Left lower leg: No edema.  Skin:    General: Skin is warm.     Findings: No rash.  Neurological:     Mental Status: She is alert and oriented to person, place, and time. Mental status is at baseline.     Motor: No weakness.     Gait: Gait normal.  Psychiatric:        Mood and Affect: Mood normal.        Behavior: Behavior normal.        Thought Content: Thought content normal.        Judgment: Judgment normal.      No results found.  Assessment/plan: Dawn Scott is a 52 y.o. female present for Chronic Conditions/illness Management HTN/obesity/Fhx HD-mother/overweight: Stable Continue amlodipine 5 mg qd Consider statin and/or cardiac CT for screen.    No follow-ups on file.   No orders of the defined types were placed in this encounter.  No  orders of the defined types were placed in this encounter.  Referral Orders  No referral(s) requested today    Electronically signed by: Felix Pacini, DO [facility]

## 2023-04-15 ENCOUNTER — Encounter: Payer: Self-pay | Admitting: Family Medicine

## 2023-04-15 ENCOUNTER — Ambulatory Visit: Payer: 59 | Admitting: Family Medicine

## 2023-04-15 VITALS — BP 142/96 | HR 81 | Temp 97.9°F | Wt 189.8 lb

## 2023-04-15 DIAGNOSIS — Z23 Encounter for immunization: Secondary | ICD-10-CM

## 2023-04-15 DIAGNOSIS — I10 Essential (primary) hypertension: Secondary | ICD-10-CM

## 2023-04-15 DIAGNOSIS — Z8249 Family history of ischemic heart disease and other diseases of the circulatory system: Secondary | ICD-10-CM | POA: Diagnosis not present

## 2023-04-15 MED ORDER — AMLODIPINE BESYLATE 5 MG PO TABS: 5.0000 mg | ORAL_TABLET | Freq: Every day | ORAL | 2 refills | Status: DC

## 2023-04-15 MED ORDER — HYDROCHLOROTHIAZIDE 12.5 MG PO CAPS: 12.5000 mg | ORAL_CAPSULE | Freq: Every day | ORAL | 2 refills | Status: DC

## 2023-04-15 NOTE — Patient Instructions (Addendum)
Return in about 7 months (around 11/02/2023) for cpe (20 min), Routine chronic condition follow-up.  Call in a week with home BP or nurse visit 3-4 weeks for BP recheck.   Bp checking Sitting 5-10 min Arm rested on counter BP med in system at least 2 hours.        Great to see you today.  I have refilled the medication(s) we provide.   If labs were collected or images ordered, we will inform you of  results once we have received them and reviewed. We will contact you either by echart message, or telephone call.  Please give ample time to the testing facility, and our office to run,  receive and review results. Please do not call inquiring of results, even if you can see them in your chart. We will contact you as soon as we are able. If it has been over 1 week since the test was completed, and you have not yet heard from [place], then please call us.    - echart message- for normal results that have been seen by the patient already.   - telephone call: abnormal results or if patient has not viewed results in their echart.  If a referral to a specialist was entered for you, please call us in 2 weeks if you have not heard from the specialist office to schedule.

## 2023-04-22 ENCOUNTER — Other Ambulatory Visit: Payer: Self-pay | Admitting: Medical Genetics

## 2023-05-04 NOTE — Telephone Encounter (Signed)
 Reported home blood pressures are at goal.  Continue medications as prescribed.

## 2023-05-14 ENCOUNTER — Other Ambulatory Visit (HOSPITAL_COMMUNITY): Payer: Self-pay

## 2023-05-15 ENCOUNTER — Ambulatory Visit (HOSPITAL_BASED_OUTPATIENT_CLINIC_OR_DEPARTMENT_OTHER)
Admission: RE | Admit: 2023-05-15 | Discharge: 2023-05-15 | Disposition: A | Discharge status: Home / Self Care | Payer: Self-pay | Source: Ambulatory Visit | Attending: Family Medicine | Admitting: Family Medicine

## 2023-05-15 DIAGNOSIS — I10 Essential (primary) hypertension: Secondary | ICD-10-CM | POA: Insufficient documentation

## 2023-05-15 DIAGNOSIS — Z8249 Family history of ischemic heart disease and other diseases of the circulatory system: Secondary | ICD-10-CM | POA: Insufficient documentation

## 2023-05-18 ENCOUNTER — Other Ambulatory Visit: Payer: Self-pay

## 2023-05-18 DIAGNOSIS — Z79890 Hormone replacement therapy (postmenopausal): Secondary | ICD-10-CM

## 2023-05-18 MED ORDER — PROGESTERONE MICRONIZED 100 MG PO CAPS: 100.0000 mg | ORAL_CAPSULE | Freq: Every day | ORAL | 0 refills | Status: DC

## 2023-05-18 MED ORDER — ESTRADIOL 0.05 MG/24HR TD PTTW: 1.0000 | MEDICATED_PATCH | TRANSDERMAL | 0 refills | Status: DC

## 2023-05-18 NOTE — Telephone Encounter (Signed)
Med refill request:progesterone Last AEX: 01/06/22 Next AEX:06/16/23 Last MMG (if hormonal med) Refill authorized: Last Rx sent #[phone] with 1 refill on 03/03/23. Please approve or deny as appropriate.

## 2023-05-18 NOTE — Telephone Encounter (Signed)
/  Med refill request:estradiol patch Last AEX: 01/06/22 Next AEX: 06/16/23 Last MMG (if hormonal med) Refill authorized: Last Rx sent #24 with 1 refill on 03/05/23. Please approve or deny as appropriate.

## 2023-05-19 ENCOUNTER — Encounter: Payer: Self-pay | Admitting: Family Medicine

## 2023-05-20 NOTE — Telephone Encounter (Signed)
No further action needed.

## 2023-06-02 NOTE — Progress Notes (Signed)
53 y.o. G45P0011 Widowed Caucasian female here for annual exam.    Here also for a recheck on HRT.  States it is working great.  Sleeping better.  Sweats stopped.  Not feeling irritable.   Denies having any vaginal bleeding.   FSH 98.5, E2 16 on 11/28/22.  Now on increased hydrochlorothiazide for blood pressure control.  Declines STD screening.   PCP: Natalia Leatherwood, DO  Psychiatry:  [facility] - Dr. Alwyn Ren office.   Patient's last menstrual period was 06/05/2022.           Sexually active: No.  The current method of family planning is abstinence/PMP.    Menopausal hormone therapy:  vivelle-dot, progesterone Exercising: Yes.     Yoga, walking Smoker:  no  OB History  Gravida Para Term Preterm AB Living  2 1   1 1   SAB IAB Ectopic Multiple Live Births    1      # Outcome Date GA Lbr Len/2nd Weight Sex Type Anes PTL Lv  2 Ectopic           1 Para              HEALTH MAINTENANCE: Last 2 paps:  01/06/22 neg, 11/06/20 ASCUS: HR HPV neg History of abnormal Pap or positive HPV:  yes.  Hx prior positive HR HPV in 2019.  Mammogram:   08/30/22 Breast Density cat c, BI-RADS CAT 1 neg Colonoscopy:  02/04/21 - due in 2032.  Bone Density:  n/a  Result  n/a   Immunization History  Administered Date(s) Administered   Influenza,inj,Quad PF,6+ Mos 02/04/2013, 01/18/2014, 01/22/2015, 02/04/2022   Influenza-Unspecified 02/11/2021, 01/27/2023   PFIZER(Purple Top)SARS-COV-2 Vaccination 07/14/2019, 08/08/2019, 05/04/2020   Pfizer Covid-19 Vaccine Bivalent Booster 25yrs & up 02/01/2021   Pfizer(Comirnaty)Fall Seasonal Vaccine 12 years and older 01/01/2023   Tdap 11/24/2014   Unspecified SARS-COV-2 Vaccination 02/04/2022   Zoster Recombinant(Shingrix) 03/28/2022, 09/11/2022      reports that she has never smoked. She has never used smokeless tobacco. She reports current alcohol use. She reports that she does not use drugs.  Past Medical History:  Diagnosis Date   Allergy     Anxiety    Bloody stool 07/07/2016   C. difficile colitis 08/03/2018   Clostridioides difficile infection    Depression    Endometriosis 06/28/2015   GAD (generalized anxiety disorder) 04/01/2018   Hemangioma of skin and subcutaneous tissue 08/21/2021   History of ectopic pregnancy    04/ 2002  S/P  LEFT SALPINGECTOMY   History of endometriosis    History of neoplasm 08/21/2021   History of ovarian cyst    Hypertension    Left ovarian cyst    LGSIL on Pap smear of cervix 08/20/2020   Menorrhagia with regular cycle    PONV (postoperative nausea and vomiting)    Shingles    Uterine fibroid     Past Surgical History:  Procedure Laterality Date   APPENDECTOMY     DILATATION & CURETTAGE/HYSTEROSCOPY WITH MYOSURE N/A 10/30/2016   Procedure: , REMOVAL IUD, HYSTEROSCOPY, DILATION AND CURETTAGE;  Surgeon: Genia Del, MD;  Location: [facility];  Service: Gynecology;  Laterality: N/A;   DILATION AND EVACUATION  03/10/2003   dr Seymour Bars   retained placental tissue postpartum   DX LAPAROSCOPY/ LYSIS ADHESIONAS/  FULGERATION ENDOMETRIOSIS/  LEFT SALPINGECTOMY WITH REMOVAL ECOTPIC  08/18/2000   dr Billy Coast   HYSTEROSCOPY WITH NOVASURE N/A 10/30/2016   Procedure: HYSTEROSCOPY WITH NOVASURE;  Surgeon: Seymour Bars,  Awilda Metro, MD;  Location: [facility];  Service: Gynecology;  Laterality: N/A;   LAPAROSCOPIC APPENDECTOMY  11/30/2007   SEPTOPLASTY  2011   WISDOM TOOTH EXTRACTION  1996    Current Outpatient Medications  Medication Sig Dispense Refill   amLODipine (NORVASC) 5 MG tablet Take 1 tablet (5 mg total) by mouth daily. 90 tablet 2   Biotin 1 MG CAPS Take by mouth.     buPROPion (WELLBUTRIN XL) 300 MG 24 hr tablet TAKE 1 TABLET BY MOUTH DAILY 90 tablet 3   Cholecalciferol (VITAMIN D3) 50 MCG (2000 UT) TABS Take 4,000 Units by mouth every morning. 60 tablet 11   Cyanocobalamin (B-12 PO) Take 1 tablet by mouth every morning.      DULoxetine (CYMBALTA) 30 MG  capsule TAKE 1 CAPSULE BY MOUTH DAILY 90 capsule 3   DULoxetine (CYMBALTA) 60 MG capsule TAKE 1 CAPSULE BY MOUTH DAILY 90 capsule 3   folic acid (FOLVITE) 1 MG tablet Take 1 mg by mouth daily.     hydrochlorothiazide (HYDRODIURIL) 25 MG tablet Take 1 tablet (25 mg total) by mouth daily. 90 tablet 1   lamoTRIgine (LAMICTAL) 150 MG tablet TAKE 1 TABLET BY MOUTH IN THE  EVENING 90 tablet 3   loratadine (CLARITIN) 10 MG tablet Take 10 mg by mouth every evening.      Multiple Vitamins-Minerals (MULTIVITAMIN WITH MINERALS) tablet Take 1 tablet by mouth every evening.      Probiotic Product (ALIGN PO) Take by mouth.     [START ON 06/18/2023] estradiol (VIVELLE-DOT) 0.05 MG/24HR patch Place 1 patch (0.05 mg total) onto the skin 2 (two) times a week. 24 patch 3   progesterone (PROMETRIUM) 100 MG capsule Take 1 capsule (100 mg total) by mouth at bedtime. 90 capsule 3   No current facility-administered medications for this visit.    ALLERGIES: Augmentin [amoxicillin-pot clavulanate]  Family History  Problem Relation Age of Onset   Depression Mother    Arthritis Mother    Heart disease Mother    Transient ischemic attack Mother    Scoliosis Mother    Kyphosis Mother    Seizures Mother        Mini seizures at 82; took off wellbutrin   Alcohol abuse Father    Depression Father    Diabetes Father    Cancer Maternal Aunt        brain cancer   Early death Maternal Aunt        cancer   Cancer Maternal Aunt        ovarian cancer   Depression Paternal Uncle    Transient ischemic attack Maternal Grandmother    Alcohol abuse Maternal Grandfather    Leukemia Maternal Grandfather    Early death Maternal Grandfather        leukemia   Arthritis Paternal Grandmother    Dementia Paternal Grandmother    Alcohol abuse Paternal Grandfather    Early death Paternal Grandfather    Colon cancer Neg Hx    Colon polyps Neg Hx    Esophageal cancer Neg Hx    Rectal cancer Neg Hx    Stomach cancer Neg Hx      Review of Systems  All other systems reviewed and are negative.   PHYSICAL EXAM:  BP 122/64 (BP Location: Left Arm, Patient Position: Sitting, Cuff Size: Small)   Pulse 81   Ht 5\' 9"  (1.753 m)   Wt 188 lb (85.3 kg)   LMP 06/05/2022 Comment: Very light  SpO2 99%   BMI 27.76 kg/m     General appearance: alert, cooperative and appears stated age Head: normocephalic, without obvious abnormality, atraumatic Neck: no adenopathy, supple, symmetrical, trachea midline and thyroid normal to inspection and palpation Lungs: clear to auscultation bilaterally Breasts: normal appearance, no masses or tenderness, No nipple retraction or dimpling, No nipple discharge or bleeding, No axillary adenopathy Heart: regular rate and rhythm Abdomen: soft, non-tender; no masses, no organomegaly Extremities: extremities normal, atraumatic, no cyanosis or edema Skin: skin color, texture, turgor normal. No rashes or lesions Lymph nodes: cervical, supraclavicular, and axillary nodes normal. Neurologic: grossly normal  Pelvic: External genitalia:  no lesions              No abnormal inguinal nodes palpated.              Urethra:  normal appearing urethra with no masses, tenderness or lesions              Bartholins and Skenes: normal                 Vagina: normal appearing vagina with normal color and discharge, no lesions              Cervix: no lesions              Pap taken: No. Bimanual Exam:  Uterus:  normal size, contour, position, consistency, mobility, non-tender              Adnexa: no mass, fullness, tenderness              Rectal exam: Yes.  .  Confirms.              Anus:  normal sphincter tone, no lesions  Chaperone was present for exam:  Warren [name], CMA  ASSESSMENT: Well woman visit with gynecologic exam HRT.  Status post endometrial ablation.  Has arcuate shaped uterus.  Status post left probable salpingotomy, not salpingectomy, for ectopic pregnancy. See HSG in Epic.  Hx prior  positive HR HPV with negative follow up testing.  FH breast and ovarian cancer.   Mother and sister had negative testing for BRCA.  Hx anxiety and depression.  Controlled on medication.  PHQ2:  0  PLAN: Mammogram screening discussed. Self breast awareness reviewed. Pap and HRV collected:  No.   Guidelines for Calcium, Vitamin D, regular exercise program including cardiovascular and weight bearing exercise. Discused WHI and use of HRT which can increase risk of PE, DVT, MI, stroke and breast cancer.  Medication refills:  Vivelle Dot 0.05 mg twice weekly, Prometrium 100 mg q hs.  Labs with PCP. Follow up:  yearly and prn.

## 2023-06-09 ENCOUNTER — Ambulatory Visit (INDEPENDENT_AMBULATORY_CARE_PROVIDER_SITE_OTHER): Payer: 59 | Admitting: Family Medicine

## 2023-06-09 ENCOUNTER — Encounter: Payer: Self-pay | Admitting: Family Medicine

## 2023-06-09 VITALS — BP 136/80 | HR 85 | Temp 97.8°F | Wt 186.0 lb

## 2023-06-09 DIAGNOSIS — E663 Overweight: Secondary | ICD-10-CM | POA: Diagnosis not present

## 2023-06-09 DIAGNOSIS — R600 Localized edema: Secondary | ICD-10-CM

## 2023-06-09 DIAGNOSIS — Z8249 Family history of ischemic heart disease and other diseases of the circulatory system: Secondary | ICD-10-CM | POA: Diagnosis not present

## 2023-06-09 DIAGNOSIS — I10 Essential (primary) hypertension: Secondary | ICD-10-CM | POA: Diagnosis not present

## 2023-06-09 LAB — BASIC METABOLIC PANEL
BUN: 9 mg/dL (ref 6–23)
CO2: 29 meq/L (ref 19–32)
Calcium: 9.4 mg/dL (ref 8.4–10.5)
Chloride: 96 meq/L (ref 96–112)
Creatinine, Ser: 0.78 mg/dL (ref 0.40–1.20)
GFR: 87.94 mL/min (ref >60.00)
Glucose, Bld: 85 mg/dL (ref 70–99)
Potassium: 3.9 meq/L (ref 3.5–5.1)
Sodium: 133 meq/L — ABNORMAL LOW (ref 135–145)

## 2023-06-09 MED ORDER — HYDROCHLOROTHIAZIDE 25 MG PO TABS: 25.0000 mg | ORAL_TABLET | Freq: Every day | ORAL | 1 refills | Status: DC

## 2023-06-09 NOTE — Progress Notes (Signed)
Patient ID: Dawn Scott, female  DOB: [birth year], 53 y.o.   MRN: [id number] Patient Care Team    Relationship Specialty Notifications Start End  Natalia Leatherwood, DO PCP - General Family Medicine  06/28/15   Genia Del, MD Consulting Physician Obstetrics and Gynecology  06/28/15   Rachael Fee, MD (Inactive) Attending Physician Gastroenterology  08/21/21   Delight Ovens, LCSW Social Worker Psychology  08/21/21     Chief Complaint  Patient presents with   Leg Swelling    Pt states after traveling her legs started to swell over time. Legs were swollen from sunburn but become worse after hiking (2/1) and flying (2/7).     Subjective: Dawn Scott is a 53 y.o.  Female  present for acute concern of b/l edema after airplane flight on to [place]. Tried compression stockings and kept feet elevated and legs improved.    HTN/obesity/Fhx HD-mother: Pt reports compliance with amlodipine 5 mg every day and hydrochlorothiazide 12.5 mg qd. Blood pressures ranges at home normal. Patient denies chest pain, shortness of breath, dizziness . Pt does not daily baby ASA. Pt is not prescribed statin. RF: htn, obesity, FHX HD mother.  Coronary ca score: ZERO (05/15/2023   All past medical history, surgical history, allergies, family history, immunizations, medications and social history were updated in the electronic medical record today. All recent labs, ED visits and hospitalizations within the last year were reviewed.     06/09/2023    9:00 AM 04/15/2023    7:46 AM 05/12/2022    1:16 PM 08/21/2021    3:24 PM 09/04/2020   11:07 AM  Depression screen PHQ 2/9  Decreased Interest 0 1 1 1  0  Down, Depressed, Hopeless 0 1 1 0 0  PHQ - 2 Score 0 2 2 1  0  Altered sleeping 0 1 0 1   Tired, decreased energy 0 1 0 2   Change in appetite 0 0 0 1   Feeling bad or failure about yourself  0 0 0 0   Trouble concentrating 0 0 2 1   Moving slowly or fidgety/restless 0 0 0 0   Suicidal thoughts 0 0  0 0   PHQ-9 Score 0 4 4 6    Difficult doing work/chores Not difficult at all Not difficult at all         06/09/2023    9:00 AM 04/15/2023    7:46 AM 05/12/2022    1:16 PM 08/21/2021    3:24 PM  GAD 7 : Generalized Anxiety Score  Nervous, Anxious, on Edge 0 0 0 1  Control/stop worrying 0 0 0 0  Worry too much - different things 0 1 0 0  Trouble relaxing 0 1 0 0  Restless 0 0 0 0  Easily annoyed or irritable 0 0 1 2  Afraid - awful might happen 0 0 0 0  Total GAD 7 Score 0 2 1 3   Anxiety Difficulty Not difficult at all Not difficult at all             Immunization History  Administered Date(s) Administered   Influenza,inj,Quad PF,6+ Mos 02/04/2013, 01/18/2014, 01/22/2015, 02/04/2022   Influenza-Unspecified 02/11/2021, 01/27/2023   PFIZER(Purple Top)SARS-COV-2 Vaccination 07/14/2019, 08/08/2019, 05/04/2020   Pfizer Covid-19 Vaccine Bivalent Booster 31yrs & up 02/01/2021   Pfizer(Comirnaty)Fall Seasonal Vaccine 12 years and older 01/01/2023   Tdap 11/24/2014   Unspecified SARS-COV-2 Vaccination 02/04/2022   Zoster Recombinant(Shingrix) 03/28/2022, 09/11/2022  Past Medical History:  Diagnosis Date   Allergy    Anxiety    Bloody stool 07/07/2016   C. difficile colitis 08/03/2018   Clostridioides difficile infection    Depression    Endometriosis 06/28/2015   GAD (generalized anxiety disorder) 04/01/2018   Hemangioma of skin and subcutaneous tissue 08/21/2021   History of ectopic pregnancy    04/ 2000/06/28  S/P  LEFT SALPINGECTOMY   History of endometriosis    History of neoplasm 08/21/2021   History of ovarian cyst    Hypertension    Left ovarian cyst    LGSIL on Pap smear of cervix 08/20/2020   Menorrhagia with regular cycle    PONV (postoperative nausea and vomiting)    Shingles    Uterine fibroid    Allergies  Allergen Reactions   Augmentin [Amoxicillin-Pot Clavulanate] Other (See Comments)    Diarrhea C.diff positive.   Past Surgical History:  Procedure  Laterality Date   APPENDECTOMY     DILATATION & CURETTAGE/HYSTEROSCOPY WITH MYOSURE N/A 10/30/2016   Procedure: , REMOVAL IUD, HYSTEROSCOPY, DILATION AND CURETTAGE;  Surgeon: Genia Del, MD;  Location: [facility] [place];  Service: Gynecology;  Laterality: N/A;   DILATION AND EVACUATION  03/10/2003   dr Seymour Bars   retained placental tissue postpartum   DX LAPAROSCOPY/ LYSIS ADHESIONAS/  FULGERATION ENDOMETRIOSIS/  LEFT SALPINGECTOMY WITH REMOVAL ECOTPIC  08/18/2000   dr Billy Coast   HYSTEROSCOPY WITH NOVASURE N/A 10/30/2016   Procedure: HYSTEROSCOPY WITH NOVASURE;  Surgeon: Genia Del, MD;  Location: [facility];  Service: Gynecology;  Laterality: N/A;   LAPAROSCOPIC APPENDECTOMY  11/30/2007   SEPTOPLASTY  2009-06-28   WISDOM TOOTH EXTRACTION  1996   Family History  Problem Relation Age of Onset   Depression Mother    Arthritis Mother    Heart disease Mother    Transient ischemic attack Mother    Scoliosis Mother    Kyphosis Mother    Seizures Mother        Mini seizures at 34; took off wellbutrin   Alcohol abuse Father    Depression Father    Diabetes Father    Transient ischemic attack Maternal Grandmother    Alcohol abuse Maternal Grandfather    Leukemia Maternal Grandfather    Early death Maternal Grandfather        leukemia   Arthritis Paternal Grandmother    Dementia Paternal Grandmother    Alcohol abuse Paternal Grandfather    Early death Paternal Grandfather    Breast cancer Maternal Aunt 38   Early death Maternal Aunt        cancer   Depression Paternal Uncle    Colon cancer Neg Hx    Colon polyps Neg Hx    Esophageal cancer Neg Hx    Rectal cancer Neg Hx    Stomach cancer Neg Hx    Social History   Social History Narrative   Widower. Husband died on the job 06/29/11.   Bachelor of fine arts, currently not working    One child, Pharmacist, community.   No tobacco or drug use, rare use of alcohol.   Drinks caffeinated beverages, takes a daily  vitamin   Wears her seatbelt, wears a bicycle, smoke detectors in the home   exercise at least 3 times a week.   Feels safe in her relationships.   Right handed    Works as Corporate treasurer    Allergies as of 06/09/2023       Reactions   Augmentin [  amoxicillin-pot Clavulanate] Other (See Comments)   Diarrhea C.diff positive.        Medication List        Accurate as of June 09, 2023  9:14 AM. If you have any questions, ask your nurse or doctor.          STOP taking these medications    hydrochlorothiazide 12.5 MG capsule Commonly known as: MICROZIDE Replaced by: hydrochlorothiazide 25 MG tablet Stopped by: Felix Pacini       TAKE these medications    ALIGN PO Take by mouth.   amLODipine 5 MG tablet Commonly known as: NORVASC Take 1 tablet (5 mg total) by mouth daily.   B-12 PO Take 1 tablet by mouth every morning.   Biotin 1 MG Caps Take by mouth.   buPROPion 300 MG 24 hr tablet Commonly known as: WELLBUTRIN XL TAKE 1 TABLET BY MOUTH DAILY   DULoxetine 60 MG capsule Commonly known as: CYMBALTA TAKE 1 CAPSULE BY MOUTH DAILY   DULoxetine 30 MG capsule Commonly known as: CYMBALTA TAKE 1 CAPSULE BY MOUTH DAILY   estradiol 0.05 MG/24HR patch Commonly known as: VIVELLE-DOT Place 1 patch (0.05 mg total) onto the skin 2 (two) times a week.   folic acid 1 MG tablet Commonly known as: FOLVITE Take 1 mg by mouth daily.   hydrochlorothiazide 25 MG tablet Commonly known as: HYDRODIURIL Take 1 tablet (25 mg total) by mouth daily. Replaces: hydrochlorothiazide 12.5 MG capsule Started by: Felix Pacini   lamoTRIgine 150 MG tablet Commonly known as: LAMICTAL TAKE 1 TABLET BY MOUTH IN THE  EVENING   loratadine 10 MG tablet Commonly known as: CLARITIN Take 10 mg by mouth every evening.   multivitamin with minerals tablet Take 1 tablet by mouth every evening.   progesterone 100 MG capsule Commonly known as: Prometrium Take 1 capsule (100 mg total) by  mouth at bedtime.   Vitamin D3 50 MCG (2000 UT) Tabs Take 4,000 Units by mouth every morning.        All past medical history, surgical history, allergies, family history, immunizations andmedications were updated in the EMR today and reviewed under the history and medication portions of their EMR.     No results found for this or any previous visit (from the past 2160 hours).   No results found.   ROS 14 pt review of systems performed and negative (unless mentioned in an HPI)  Objective: BP 136/80   Pulse 85   Temp 97.8 F (36.6 C)   Wt 186 lb (84.4 kg)   LMP 06/05/2022 Comment: Very light  SpO2 96%   BMI 27.47 kg/m  Physical Exam Vitals and nursing note reviewed.  Constitutional:      General: She is not in acute distress.    Appearance: Normal appearance. She is not ill-appearing, toxic-appearing or diaphoretic.  HENT:     Head: Normocephalic and atraumatic.  Eyes:     General: No scleral icterus.       Right eye: No discharge.        Left eye: No discharge.     Extraocular Movements: Extraocular movements intact.     Conjunctiva/sclera: Conjunctivae normal.     Pupils: Pupils are equal, round, and reactive to light.  Cardiovascular:     Rate and Rhythm: Normal rate and regular rhythm.     Heart sounds: No murmur heard. Pulmonary:     Effort: Pulmonary effort is normal. No respiratory distress.     Breath sounds: Normal  breath sounds. No wheezing, rhonchi or rales.  Musculoskeletal:     Right lower leg: No edema.     Left lower leg: No edema.  Skin:    General: Skin is warm.     Findings: No rash.  Neurological:     Mental Status: She is alert and oriented to person, place, and time. Mental status is at baseline.     Motor: No weakness.     Gait: Gait normal.  Psychiatric:        Mood and Affect: Mood normal.        Behavior: Behavior normal.        Thought Content: Thought content normal.        Judgment: Judgment normal.      No results  found.  Assessment/plan: Dawn Scott is a 53 y.o. female present for Chronic Conditions/illness Management HTN/Fhx HD-mother/E66.3: Above goal Continue amlodipine 5 mg every day increase hydrochlorothiazide to 25 mg qd BMP collected today due to fluid overload and recent start of HCTZ.  Other Labs up-to-date 10/2022 Coronary ca score: ZERO (05/15/2023)  Bilateral lower extremity edema No edema remaining today Discussed compression stockings.  - AVS education provided  Return if symptoms worsen or fail to improve.   Orders Placed This Encounter  Procedures   Basic Metabolic Panel (BMET)   Meds ordered this encounter  Medications   hydrochlorothiazide (HYDRODIURIL) 25 MG tablet    Sig: Take 1 tablet (25 mg total) by mouth daily.    Dispense:  90 tablet    Refill:  1    Dc lower dose HCTZ   Referral Orders  No referral(s) requested today    Electronically signed by: Felix Pacini, DO [facility]

## 2023-06-09 NOTE — Patient Instructions (Signed)
No follow-ups on file.   If taking a lasix, make sure to increase potassium intake also.  Potassium rich food: Bananas, oranges, cantaloupe, grapefruit ,prunes, raisins, and dates,Cooked spinach., Cooked broccoli.,Potatoes.,Sweet potatoes,Mushrooms, Peas,Cucumbers      Great to see you today.  I have refilled the medication(s) we provide.   If labs were collected or images ordered, we will inform you of  results once we have received them and reviewed. We will contact you either by echart message, or telephone call.  Please give ample time to the testing facility, and our office to run,  receive and review results. Please do not call inquiring of results, even if you can see them in your chart. We will contact you as soon as we are able. If it has been over 1 week since the test was completed, and you have not yet heard from [place], then please call us.    - echart message- for normal results that have been seen by the patient already.   - telephone call: abnormal results or if patient has not viewed results in their echart.  If a referral to a specialist was entered for you, please call us in 2 weeks if you have not heard from the specialist office to schedule.

## 2023-06-10 ENCOUNTER — Encounter: Payer: Self-pay | Admitting: Family Medicine

## 2023-06-13 ENCOUNTER — Telehealth: Payer: Self-pay | Admitting: Pharmacy Technician

## 2023-06-13 ENCOUNTER — Other Ambulatory Visit (HOSPITAL_COMMUNITY): Payer: Self-pay

## 2023-06-13 NOTE — Telephone Encounter (Signed)
Pharmacy Patient Advocate Encounter- Botox BIV-Medical Benefit:  Buy/Bill J code: N6295 CPT code: 28413 Dx Code: G24.3

## 2023-06-16 ENCOUNTER — Encounter: Payer: Self-pay | Admitting: Obstetrics and Gynecology

## 2023-06-16 ENCOUNTER — Ambulatory Visit (INDEPENDENT_AMBULATORY_CARE_PROVIDER_SITE_OTHER): Payer: 59 | Admitting: Obstetrics and Gynecology

## 2023-06-16 VITALS — BP 122/64 | HR 81 | Ht 69.0 in | Wt 188.0 lb

## 2023-06-16 DIAGNOSIS — Z01419 Encounter for gynecological examination (general) (routine) without abnormal findings: Secondary | ICD-10-CM

## 2023-06-16 DIAGNOSIS — Z79890 Hormone replacement therapy (postmenopausal): Secondary | ICD-10-CM | POA: Diagnosis not present

## 2023-06-16 DIAGNOSIS — Z1331 Encounter for screening for depression: Secondary | ICD-10-CM

## 2023-06-16 MED ORDER — PROGESTERONE MICRONIZED 100 MG PO CAPS: 100.0000 mg | ORAL_CAPSULE | Freq: Every day | ORAL | 3 refills | Status: DC

## 2023-06-16 MED ORDER — ESTRADIOL 0.05 MG/24HR TD PTTW: 1.0000 | MEDICATED_PATCH | TRANSDERMAL | 3 refills | Status: DC

## 2023-06-16 NOTE — Patient Instructions (Signed)

## 2023-06-19 ENCOUNTER — Ambulatory Visit (INDEPENDENT_AMBULATORY_CARE_PROVIDER_SITE_OTHER): Payer: 59 | Admitting: Neurology

## 2023-06-19 DIAGNOSIS — G243 Spasmodic torticollis: Secondary | ICD-10-CM

## 2023-06-19 MED ORDER — ONABOTULINUMTOXINA 100 UNITS IJ SOLR
300.0000 [IU] | Freq: Once | INTRAMUSCULAR | Status: AC
  Administered 2023-06-19: 270 [IU] via INTRAMUSCULAR

## 2023-06-19 NOTE — Procedures (Signed)
[facility]   Procedure Note Botox  Attending: Dr. Lurena Joiner Dawn Scott  Preoperative Diagnosis(es): Cervical Dystonia  Result History  Recent pain over the last week in the L trap, rarely radiating down the L deltoid  Consent obtained from: The patient Benefits discussed included, but were not limited to decreased muscle tightness, increased joint range of motion, and decreased pain.  Risk discussed included, but were not limited pain and discomfort, bleeding, bruising, excessive weakness, venous thrombosis, muscle atrophy and dysphagia.  A copy of the patient medication guide was given to the patient which explains the blackbox warning.  Patients identity and treatment sites confirmed Yes.  .  Details of Procedure: Skin was cleaned with alcohol.  A 30 gauge, 25mm  needle was introduced to the target muscle, except for posterior splenius where 27 gauge, 1.5 inch needle used.   Prior to injection, the needle plunger was aspirated to make sure the needle was not within a blood vessel.  There was no blood retrieved on aspiration.    Following is a summary of the muscles injected  And the amount of Botulinum toxin used:   Dilution 0.9% preservative free saline mixed with 100 u Botox type A to make 10 U per 0.1cc  Injections  Location Left  Right Units Number of sites        Sternocleidomastoid  70 70 1  Splenius Capitus, posterior approach 100  100 1  Splenius Capitus, lateral approach 30  30 1   Levator Scapulae 10  10 1   Trapezius 20 x 3  60 3        TOTAL UNITS:   270    Agent: Botulinum Type A ( Onobotulinum Toxin type A ).  3 vials of Botox were used, each containing 100 units and freshly diluted with 1 mL of sterile, non-preserved saline   Total injected (Units): 270  Total wasted (Units): 30   Pt tolerated procedure well without complications.   Reinjection is anticipated in 3 months.

## 2023-07-09 ENCOUNTER — Encounter: Payer: Self-pay | Admitting: Physician Assistant

## 2023-07-09 ENCOUNTER — Ambulatory Visit: Payer: 59 | Admitting: Physician Assistant

## 2023-07-09 DIAGNOSIS — F411 Generalized anxiety disorder: Secondary | ICD-10-CM | POA: Diagnosis not present

## 2023-07-09 DIAGNOSIS — F3342 Major depressive disorder, recurrent, in full remission: Secondary | ICD-10-CM

## 2023-07-09 MED ORDER — LAMOTRIGINE 150 MG PO TABS: 150.0000 mg | ORAL_TABLET | Freq: Every evening | ORAL | 3 refills | Status: DC

## 2023-07-09 NOTE — Progress Notes (Signed)
 [facility] Med Check  Patient ID: Dawn Scott,  MRN: [id number]  PCP: Natalia Leatherwood, DO  Date of Evaluation: 07/09/2023 Time spent:20 minutes  Chief Complaint:  Chief Complaint   Depression; Follow-up    HISTORY/CURRENT STATUS: HPI For routine 46-month med check.    Doing well with current meds. Patient is able to enjoy things. Went to [place] for 2 weeks to visit a friend in her family.  She had a really good time.  Energy and motivation are good.  Work is going well.   No extreme sadness, tearfulness, or feelings of hopelessness.  Sleeps well most of the time. ADLs and personal hygiene are normal.   Denies any changes in concentration, making decisions, or remembering things.  Appetite has not changed.  Weight is stable.  Has occasional anxiety but it is triggered and she tries to avoid those triggers.  Denies suicidal or homicidal thoughts.  Patient denies increased energy with decreased need for sleep, increased talkativeness, racing thoughts, impulsivity or risky behaviors, increased spending, increased libido, grandiosity, increased irritability or anger, paranoia, or hallucinations.  Denies dizziness, syncope, seizures, numbness, tingling, tremor, tics, unsteady gait, slurred speech, confusion. Denies muscle or joint pain, stiffness, or dystonia.  Individual Medical History/ Review of Systems: Changes? :Yes  Having botox injections in neck q 12 weeks.    Past medications for mental health diagnoses include: Klonopin, Prozac, Prosom, Ambien, Cymbalta, Wellbutrin, Lamictal   Allergies: Augmentin [amoxicillin-pot clavulanate]  Current Medications:  Current Outpatient Medications:    amLODipine (NORVASC) 5 MG tablet, Take 1 tablet (5 mg total) by mouth daily., Disp: 90 tablet, Rfl: 2   Biotin 1 MG CAPS, Take by mouth., Disp: , Rfl:    buPROPion (WELLBUTRIN XL) 300 MG 24 hr tablet, TAKE 1 TABLET BY MOUTH DAILY, Disp: 90 tablet, Rfl: 3   Cholecalciferol (VITAMIN D3) 50  MCG (2000 UT) TABS, Take 4,000 Units by mouth every morning., Disp: 60 tablet, Rfl: 11   Cyanocobalamin (B-12 PO), Take 1 tablet by mouth every morning. , Disp: , Rfl:    DULoxetine (CYMBALTA) 30 MG capsule, TAKE 1 CAPSULE BY MOUTH DAILY, Disp: 90 capsule, Rfl: 3   DULoxetine (CYMBALTA) 60 MG capsule, TAKE 1 CAPSULE BY MOUTH DAILY, Disp: 90 capsule, Rfl: 3   estradiol (VIVELLE-DOT) 0.05 MG/24HR patch, Place 1 patch (0.05 mg total) onto the skin 2 (two) times a week., Disp: 24 patch, Rfl: 3   folic acid (FOLVITE) 1 MG tablet, Take 1 mg by mouth daily., Disp: , Rfl:    hydrochlorothiazide (HYDRODIURIL) 25 MG tablet, Take 1 tablet (25 mg total) by mouth daily., Disp: 90 tablet, Rfl: 1   loratadine (CLARITIN) 10 MG tablet, Take 10 mg by mouth every evening. , Disp: , Rfl:    Multiple Vitamins-Minerals (MULTIVITAMIN WITH MINERALS) tablet, Take 1 tablet by mouth every evening. , Disp: , Rfl:    Probiotic Product (ALIGN PO), Take by mouth., Disp: , Rfl:    progesterone (PROMETRIUM) 100 MG capsule, Take 1 capsule (100 mg total) by mouth at bedtime., Disp: 90 capsule, Rfl: 3   lamoTRIgine (LAMICTAL) 150 MG tablet, Take 1 tablet (150 mg total) by mouth every evening., Disp: 90 tablet, Rfl: 3 Medication Side Effects: none  Family Medical/ Social History: Changes?  no  MENTAL HEALTH EXAM:  Last menstrual period 06/05/2022.There is no height or weight on file to calculate BMI.  General Appearance: Casual, Neat and Well Groomed  Eye Contact:  Good  Speech:  Clear and  Coherent and Normal Rate  Volume:  Normal  Mood:  Euthymic  Affect:  Congruent  Thought Process:  Goal Directed and Descriptions of Associations: Circumstantial  Orientation:  Full (Time, Place, and Person)  Thought Content: Logical   Suicidal Thoughts:  No  Homicidal Thoughts:  No  Memory:  WNL  Judgement:  Good  Insight:  Good  Psychomotor Activity:  Normal  Concentration:  Concentration: Good and Attention Span: Good  Recall:   Good  Fund of Knowledge: Good  Language: Good  Assets:  Desire for Improvement Financial Resources/Insurance Housing Resilience Transportation Vocational/Educational  ADL's:  Intact  Cognition: WNL  Prognosis:  Good   DIAGNOSES:    ICD-10-CM   1. Recurrent major depressive disorder, in full remission (HCC)  F33.42     2. Generalized anxiety disorder  F41.1       Receiving Psychotherapy: No    RECOMMENDATIONS:  PDMP was reviewed.  Gabapentin filled 11/28/2022.   I provided 20 minutes of face to face time during this encounter, including time spent before and after the visit in records review, medical decision making, counseling pertinent to today's visit, and charting.   She is doing really well so no medication changes are needed.  Continue Wellbutrin XL 300 mg, 1 p.o. every morning. Continue Cymbalta 30 mg +60 mg daily. Continue Lamictal 150 mg daily. Continue multivitamin, vitamin D, B12, and biotin. Return in 6 months.  Melony Overly, PA-C

## 2023-08-03 ENCOUNTER — Other Ambulatory Visit: Payer: Self-pay | Admitting: Family Medicine

## 2023-08-06 ENCOUNTER — Encounter: Payer: Self-pay | Admitting: Neurology

## 2023-08-07 NOTE — Telephone Encounter (Signed)
 No I didn't see your OK sorry Dr. Arbutus Leas

## 2023-09-08 ENCOUNTER — Encounter: Payer: Self-pay | Admitting: Obstetrics and Gynecology

## 2023-09-08 ENCOUNTER — Ambulatory Visit: Payer: Self-pay | Admitting: Obstetrics and Gynecology

## 2023-09-08 NOTE — Telephone Encounter (Signed)
 Results and recommendations seen by patient.   Placed in [facility] hold.

## 2023-09-22 ENCOUNTER — Encounter: Payer: Self-pay | Admitting: Obstetrics and Gynecology

## 2023-09-23 ENCOUNTER — Ambulatory Visit: Payer: Self-pay | Admitting: Obstetrics and Gynecology

## 2023-09-25 ENCOUNTER — Ambulatory Visit (INDEPENDENT_AMBULATORY_CARE_PROVIDER_SITE_OTHER): Payer: 59 | Admitting: Neurology

## 2023-09-25 DIAGNOSIS — G243 Spasmodic torticollis: Secondary | ICD-10-CM

## 2023-09-25 MED ORDER — ONABOTULINUMTOXINA 100 UNITS IJ SOLR
300.0000 [IU] | Freq: Once | INTRAMUSCULAR | Status: AC
  Administered 2023-09-25: 270 [IU] via INTRAMUSCULAR

## 2023-09-25 NOTE — Procedures (Signed)
 [facility]   Procedure Note Botox   Attending: Dr. Ivette Marks Zekiah Caruth  Preoperative Diagnosis(es): Cervical Dystonia  Result History  Doing well with injections.  Has the L trap pain about 2 weeks prior to the injection.  Requests RX for massage therapy for insurance  Consent obtained from: The patient Benefits discussed included, but were not limited to decreased muscle tightness, increased joint range of motion, and decreased pain.  Risk discussed included, but were not limited pain and discomfort, bleeding, bruising, excessive weakness, venous thrombosis, muscle atrophy and dysphagia.  A copy of the patient medication guide was given to the patient which explains the blackbox warning.  Patients identity and treatment sites confirmed Yes.  .  Details of Procedure: Skin was cleaned with alcohol.  A 30 gauge, 25mm  needle was introduced to the target muscle, except for posterior splenius where 27 gauge, 1.5 inch needle used.   Prior to injection, the needle plunger was aspirated to make sure the needle was not within a blood vessel.  There was no blood retrieved on aspiration.    Following is a summary of the muscles injected  And the amount of Botulinum toxin used:   Dilution 0.9% preservative free saline mixed with 100 u Botox  type A to make 10 U per 0.1cc  Injections  Location Left  Right Units Number of sites        Sternocleidomastoid  70 70 1  Splenius Capitus, posterior approach 100  100 1  Splenius Capitus, lateral approach 30  30 1   Levator Scapulae 10  10 1   Trapezius 20 x 3  60 3        TOTAL UNITS:   270    Agent: Botulinum Type A ( Onobotulinum Toxin type A ).  3 vials of Botox  were used, each containing 100 units and freshly diluted with 1 mL of sterile, non-preserved saline   Total injected (Units): 270  Total wasted (Units): 30   Pt tolerated procedure well without complications.   Reinjection is anticipated in 3 months.

## 2023-10-16 DIAGNOSIS — W19XXXA Unspecified fall, initial encounter: Secondary | ICD-10-CM

## 2023-10-16 HISTORY — DX: Unspecified fall, initial encounter: W19.XXXA

## 2023-10-18 ENCOUNTER — Other Ambulatory Visit: Payer: Self-pay | Admitting: Obstetrics and Gynecology

## 2023-10-18 DIAGNOSIS — Z79890 Hormone replacement therapy (postmenopausal): Secondary | ICD-10-CM

## 2023-10-19 NOTE — Telephone Encounter (Signed)
 Med refill request: estradiol  Last AEX: 06/13/23 BS Next AEX: 06/22/24 BS Last MMG (if hormonal med) 09/04/23 Refill authorized: Last Rx sent #[id number] with 3 refills on 06/18/23 BS. Please approve or deny.

## 2023-10-22 ENCOUNTER — Telehealth: Payer: Self-pay

## 2023-10-22 NOTE — Telephone Encounter (Signed)
 Copied from CRM [id number]. Topic: Clinical - Request for Lab/Test Order >> Oct 22, 2023  4:01 PM Dawn Scott wrote: Reason for CRM: Patient would like an order put in for thyroid  and full blood workup She been having some issues that she would like to discuss at her next appointment   Please advise. Pt is scheduled for OV on 7/1

## 2023-10-23 NOTE — Telephone Encounter (Signed)
 Noted orders will be placed during the office visit if appropriate, once we discuss: Her issues are.

## 2023-10-27 ENCOUNTER — Ambulatory Visit (INDEPENDENT_AMBULATORY_CARE_PROVIDER_SITE_OTHER): Payer: 59 | Admitting: Family Medicine

## 2023-10-27 ENCOUNTER — Other Ambulatory Visit: Payer: Self-pay | Admitting: Physician Assistant

## 2023-10-27 ENCOUNTER — Encounter: Payer: Self-pay | Admitting: Family Medicine

## 2023-10-27 VITALS — BP 122/84 | HR 73 | Temp 97.9°F | Wt 185.8 lb

## 2023-10-27 DIAGNOSIS — Z8249 Family history of ischemic heart disease and other diseases of the circulatory system: Secondary | ICD-10-CM | POA: Diagnosis not present

## 2023-10-27 DIAGNOSIS — R413 Other amnesia: Secondary | ICD-10-CM | POA: Diagnosis not present

## 2023-10-27 DIAGNOSIS — G243 Spasmodic torticollis: Secondary | ICD-10-CM

## 2023-10-27 DIAGNOSIS — E663 Overweight: Secondary | ICD-10-CM

## 2023-10-27 DIAGNOSIS — R5383 Other fatigue: Secondary | ICD-10-CM

## 2023-10-27 DIAGNOSIS — I10 Essential (primary) hypertension: Secondary | ICD-10-CM | POA: Diagnosis not present

## 2023-10-27 DIAGNOSIS — E559 Vitamin D deficiency, unspecified: Secondary | ICD-10-CM | POA: Diagnosis not present

## 2023-10-27 LAB — VITAMIN D 25 HYDROXY (VIT D DEFICIENCY, FRACTURES): VITD: 50.5 ng/mL (ref 30.00–100.00)

## 2023-10-27 LAB — COMPREHENSIVE METABOLIC PANEL WITH GFR
ALT: 11 U/L (ref 0–35)
AST: 19 U/L (ref 0–37)
Albumin: 4.6 g/dL (ref 3.5–5.2)
Alkaline Phosphatase: 55 U/L (ref 39–117)
BUN: 9 mg/dL (ref 6–23)
CO2: 32 meq/L (ref 19–32)
Calcium: 9.8 mg/dL (ref 8.4–10.5)
Chloride: 86 meq/L — ABNORMAL LOW (ref 96–112)
Creatinine, Ser: 0.65 mg/dL (ref 0.40–1.20)
GFR: 101.66 mL/min (ref >60.00)
Glucose, Bld: 73 mg/dL (ref 70–99)
Potassium: 3.7 meq/L (ref 3.5–5.1)
Sodium: 126 meq/L — ABNORMAL LOW (ref 135–145)
Total Bilirubin: 0.4 mg/dL (ref 0.2–1.2)
Total Protein: 7.1 g/dL (ref 6.0–8.3)

## 2023-10-27 LAB — TSH: TSH: 2.85 u[IU]/mL (ref 0.35–5.50)

## 2023-10-27 LAB — CBC
HCT: 38.2 % (ref 36.0–46.0)
Hemoglobin: 13.1 g/dL (ref 12.0–15.0)
MCHC: 34.2 g/dL (ref 30.0–36.0)
MCV: 86 fl (ref 78.0–100.0)
Platelets: 382 10*3/uL (ref 150.0–400.0)
RBC: 4.44 Mil/uL (ref 3.87–5.11)
RDW: 12.9 % (ref 11.5–15.5)
WBC: 4.8 10*3/uL (ref 4.0–10.5)

## 2023-10-27 LAB — B12 AND FOLATE PANEL
Folate: >23.2 ng/mL (ref >5.9)
Vitamin B-12: >1500 pg/mL — ABNORMAL HIGH (ref 211–911)

## 2023-10-27 MED ORDER — HYDROCHLOROTHIAZIDE 25 MG PO TABS: 25.0000 mg | ORAL_TABLET | Freq: Every day | ORAL | 1 refills | Status: DC

## 2023-10-27 MED ORDER — AMLODIPINE BESYLATE 5 MG PO TABS: 5.0000 mg | ORAL_TABLET | Freq: Every day | ORAL | 2 refills | Status: DC

## 2023-10-27 NOTE — Patient Instructions (Addendum)

## 2023-10-27 NOTE — Progress Notes (Signed)
 Patient ID: Dawn Scott, female  DOB: 1971/04/21, 53 y.o.   MRN: [id number] Patient Care Team    Relationship Specialty Notifications Start End  Catherine Charlies LABOR, DO PCP - General Family Medicine  06/28/15   Teressa Toribio SHAUNNA, MD (Inactive) Attending Physician Gastroenterology  08/21/21   Micah Ludwig, LCSW Social Worker Psychology  08/21/21   Elnor Longs, PA-C  Dermatology  10/27/23     Chief Complaint  Patient presents with   Hypertension    Chronic Conditions/illness Management.  Pt also mentions ongoing fatigue and trouble concentrating for over a month. Requesting TSH panel.      Subjective: Dawn Scott is a 53 y.o.  Female  present for Chronic Conditions/illness Management All past medical history, surgical history, allergies, family history, immunizations, medications and social history were updated in the electronic medical record today.  HTN/obesity/Fhx HD-mother: Pt reports compliance with amlodipine  5 mg every day and hydrochlorothiazide  25 mg qd. Blood pressures ranges at home normal. Patient denies chest pain, shortness of breath, dizziness or lower extremity edema.  Pt does not daily baby ASA. Pt is not prescribed statin. RF: htn, obesity, FHX HD mother.  Coronary ca score: ZERO (05/15/2023)  Fatigue/memory: New problem, started about 6 weeks ago. Noticed she has become forgetful. More tired.  Concerned about thyroid .  Also [place] and hit her head, wrist and knee. Was seen at ED and had CT> normal.      10/27/2023    7:41 AM 06/16/2023    8:03 AM 06/09/2023    9:00 AM 04/15/2023    7:46 AM 05/12/2022    1:16 PM  Depression screen PHQ 2/9  Decreased Interest 1 0 0 1 1  Down, Depressed, Hopeless 0 0 0 1 1  PHQ - 2 Score 1 0 0 2 2  Altered sleeping 1  0 1 0  Tired, decreased energy 2  0 1 0  Change in appetite 0  0 0 0  Feeling bad or failure about yourself  0  0 0 0  Trouble concentrating 2  0 0 2  Moving slowly or  fidgety/restless 0  0 0 0  Suicidal thoughts 0  0 0 0  PHQ-9 Score 6  0 4 4  Difficult doing work/chores Not difficult at all  Not difficult at all Not difficult at all       10/27/2023    7:41 AM 06/09/2023    9:00 AM 04/15/2023    7:46 AM 05/12/2022    1:16 PM  GAD 7 : Generalized Anxiety Score  Nervous, Anxious, on Edge 0 0 0 0  Control/stop worrying 0 0 0 0  Worry too much - different things 0 0 1 0  Trouble relaxing 0 0 1 0  Restless 0 0 0 0  Easily annoyed or irritable 0 0 0 1  Afraid - awful might happen 0 0 0 0  Total GAD 7 Score 0 0 2 1  Anxiety Difficulty Not difficult at all Not difficult at all Not difficult at all            Immunization History  Administered Date(s) Administered   Influenza,inj,Quad PF,6+ Mos 02/04/2013, 01/18/2014, 01/22/2015, 02/04/2022   Influenza-Unspecified 02/11/2021, 01/27/2023   PFIZER(Purple Top)SARS-COV-2 Vaccination 07/14/2019, 08/08/2019, 05/04/2020   Pfizer Covid-19 Vaccine Bivalent Booster 80yrs & up 02/01/2021   Pfizer(Comirnaty)Fall Seasonal Vaccine 12 years and older 07/30/2023   Tdap 11/24/2014   Unspecified  SARS-COV-2 Vaccination 02/04/2022   Zoster Recombinant(Shingrix) 03/28/2022, 09/11/2022     Past Medical History:  Diagnosis Date   Allergy    Anxiety    Bloody stool 07/07/2016   C. difficile colitis 08/03/2018   Clostridioides difficile infection    Depression    Endometriosis 06/28/2015   GAD (generalized anxiety disorder) 04/01/2018   Hemangioma of skin and subcutaneous tissue 08/21/2021   History of ectopic pregnancy    04/ 11/22/00  S/P  LEFT SALPINGECTOMY   History of endometriosis    History of neoplasm 08/21/2021   History of ovarian cyst    Hypertension    Left ovarian cyst    LGSIL on Pap smear of cervix 08/20/2020   Menorrhagia with regular cycle    PONV (postoperative nausea and vomiting)    Shingles    Uterine fibroid    Allergies  Allergen Reactions   Augmentin  [Amoxicillin -Pot Clavulanate]  Other (See Comments)    Diarrhea C.diff positive.   Past Surgical History:  Procedure Laterality Date   APPENDECTOMY     DILATATION & CURETTAGE/HYSTEROSCOPY WITH MYOSURE N/A 10/30/2016   Procedure: , REMOVAL IUD, HYSTEROSCOPY, DILATION AND CURETTAGE;  Surgeon: Lavoie, Marie-Lyne, MD;  Location: [facility];  Service: Gynecology;  Laterality: N/A;   DILATION AND EVACUATION  03/10/2003   dr lavoie   retained placental tissue postpartum   DX LAPAROSCOPY/ LYSIS ADHESIONAS/  FULGERATION ENDOMETRIOSIS/  LEFT SALPINGECTOMY WITH REMOVAL ECOTPIC  08/18/2000   dr gorge   HYSTEROSCOPY WITH NOVASURE N/A 10/30/2016   Procedure: HYSTEROSCOPY WITH NOVASURE;  Surgeon: Lavoie, Marie-Lyne, MD;  Location: [facility] ;  Service: Gynecology;  Laterality: N/A;   LAPAROSCOPIC APPENDECTOMY  11/30/2007   SEPTOPLASTY  November 22, 2009   WISDOM TOOTH EXTRACTION  1996   Family History  Problem Relation Age of Onset   Depression Mother    Arthritis Mother    Heart disease Mother    Transient ischemic attack Mother    Scoliosis Mother    Kyphosis Mother    Seizures Mother        Mini seizures at 51; took off wellbutrin    Alcohol abuse Father    Depression Father    Diabetes Father    Cancer Maternal Aunt        brain cancer   Early death Maternal Aunt        cancer   Cancer Maternal Aunt        ovarian cancer   Depression Paternal Uncle    Transient ischemic attack Maternal Grandmother    Alcohol abuse Maternal Grandfather    Leukemia Maternal Grandfather    Early death Maternal Grandfather        leukemia   Arthritis Paternal Grandmother    Dementia Paternal Grandmother    Alcohol abuse Paternal Grandfather    Early death Paternal Grandfather    Colon cancer Neg Hx    Colon polyps Neg Hx    Esophageal cancer Neg Hx    Rectal cancer Neg Hx    Stomach cancer Neg Hx    Social History   Social History Narrative   Widower. Husband died on the job 11/23/2011.   Bachelor of fine  arts, currently not working    One child, Pharmacist, community.   No tobacco or drug use, rare use of alcohol.   Drinks caffeinated beverages, takes a daily vitamin   Wears her seatbelt, wears a bicycle, smoke detectors in the home   exercise at least 3 times a week.  Feels safe in her relationships.   Right handed    Works as Corporate treasurer    Allergies as of 10/27/2023       Reactions   Augmentin  [amoxicillin -pot Clavulanate] Other (See Comments)   Diarrhea C.diff positive.        Medication List        Accurate as of October 27, 2023  7:52 AM. If you have any questions, ask your nurse or doctor.          ALIGN PO Take by mouth.   amLODipine  5 MG tablet Commonly known as: NORVASC  Take 1 tablet (5 mg total) by mouth daily.   B-12 PO Take 1 tablet by mouth every morning.   Biotin 1 MG Caps Take by mouth.   buPROPion  300 MG 24 hr tablet Commonly known as: WELLBUTRIN  XL TAKE 1 TABLET BY MOUTH DAILY   DULoxetine  60 MG capsule Commonly known as: CYMBALTA  TAKE 1 CAPSULE BY MOUTH DAILY   DULoxetine  30 MG capsule Commonly known as: CYMBALTA  TAKE 1 CAPSULE BY MOUTH DAILY   estradiol  0.05 MG/24HR patch Commonly known as: VIVELLE -DOT PLACE 1 PATCH (0.05 MG TOTAL) ONTO THE SKIN 2 (TWO) TIMES A WEEK.   folic acid 1 MG tablet Commonly known as: FOLVITE Take 1 mg by mouth daily.   hydrochlorothiazide  25 MG tablet Commonly known as: HYDRODIURIL  Take 1 tablet (25 mg total) by mouth daily.   lamoTRIgine  150 MG tablet Commonly known as: LAMICTAL  Take 1 tablet (150 mg total) by mouth every evening.   loratadine 10 MG tablet Commonly known as: CLARITIN Take 10 mg by mouth every evening.   multivitamin with minerals tablet Take 1 tablet by mouth every evening.   progesterone  100 MG capsule Commonly known as: Prometrium  Take 1 capsule (100 mg total) by mouth at bedtime.   Vitamin D3 50 MCG (2000 UT) Tabs Take 4,000 Units by mouth every morning.        All past medical history,  surgical history, allergies, family history, immunizations andmedications were updated in the EMR today and reviewed under the history and medication portions of their EMR.     No results found for this or any previous visit (from the past 2160 hours).   No results found.   ROS 14 pt review of systems performed and negative (unless mentioned in an HPI)  Objective: BP 122/84   Pulse 73   Temp 97.9 F (36.6 C)   Wt 185 lb 12.8 oz (84.3 kg)   LMP 06/05/2022 Comment: Very light  SpO2 98%   BMI 27.44 kg/m  Physical Exam Vitals and nursing note reviewed.  Constitutional:      General: She is not in acute distress.    Appearance: Normal appearance. She is not ill-appearing, toxic-appearing or diaphoretic.  HENT:     Head: Normocephalic and atraumatic.   Eyes:     General: No scleral icterus.       Right eye: No discharge.        Left eye: No discharge.     Extraocular Movements: Extraocular movements intact.     Conjunctiva/sclera: Conjunctivae normal.     Pupils: Pupils are equal, round, and reactive to light.    Cardiovascular:     Rate and Rhythm: Normal rate and regular rhythm.     Heart sounds: No murmur heard. Pulmonary:     Effort: Pulmonary effort is normal. No respiratory distress.     Breath sounds: Normal breath sounds. No wheezing, rhonchi or rales.  Musculoskeletal:     Cervical back: Neck supple.     Right lower leg: No edema.     Left lower leg: No edema.   Skin:    General: Skin is warm.     Findings: No rash.   Neurological:     Mental Status: She is alert and oriented to person, place, and time. Mental status is at baseline.     Motor: No weakness.     Gait: Gait normal.   Psychiatric:        Mood and Affect: Mood normal.        Behavior: Behavior normal.        Thought Content: Thought content normal.        Judgment: Judgment normal.      No results found.  Assessment/plan: DREAMER CARILLO is a 53 y.o. female present for Chronic  Conditions/illness Management HTN/Fhx HD-mother/E66.3: Stable Continue amlodipine  5 mg every day Continue hydrochlorothiazide  to 25 mg qd Due next visit Coronary ca score: ZERO (05/15/2023)  Bilateral lower extremity edema Continue use of compression stockings as needed.  Low-sodium diet Keep lower extremities elevated when able  Fatigue/confusion: New problem Hydrating well Cbc, iron, vit d, cmp, b12  Return in about 24 weeks (around 04/12/2024) for cpe (20 min), Routine chronic condition follow-up.   Orders Placed This Encounter  Procedures   CBC   Comp Met (CMET)   Vitamin D  (25 hydroxy)   B12 and Folate Panel   TSH   Meds ordered this encounter  Medications   amLODipine  (NORVASC ) 5 MG tablet    Sig: Take 1 tablet (5 mg total) by mouth daily.    Dispense:  90 tablet    Refill:  2    90 days only   hydrochlorothiazide  (HYDRODIURIL ) 25 MG tablet    Sig: Take 1 tablet (25 mg total) by mouth daily.    Dispense:  90 tablet    Refill:  1    Dc lower dose HCTZ   Referral Orders  No referral(s) requested today    Electronically signed by: Charlies Bellini, DO  [facility] [place]

## 2023-10-28 ENCOUNTER — Ambulatory Visit: Payer: Self-pay | Admitting: Family Medicine

## 2023-10-28 MED ORDER — LOSARTAN POTASSIUM 25 MG PO TABS: 12.5000 mg | ORAL_TABLET | Freq: Every day | ORAL | 0 refills | Status: DC

## 2023-10-28 MED ORDER — LOSARTAN POTASSIUM 25 MG PO TABS: 12.5000 mg | ORAL_TABLET | Freq: Every day | ORAL | 1 refills | Status: DC

## 2023-10-28 NOTE — Telephone Encounter (Signed)
 Please call patient Her sodium is low at 126  Stop HCTZ use.  Start losartan 12.5 mg in its place with the amlodipine . I have called in 90-day prescription to her [facility] and a 30-day prescription into local [facility] listed surgery can get started on this right away.  She can hold onto the HCTZ and use half a tab if she gets fluid in her extremities, otherwise we will not continue this for routine daily use.  Encourage electrolyte drinks, V8 juice is also beneficial to help increase sodium.  Her folate, vitamin D , B12 and blood cell count and thyroid  function are all normal.   Follow-up in 4 weeks with provider for blood pressure recheck and we will also repeat labs that day to recheck her sodium.

## 2023-10-29 ENCOUNTER — Other Ambulatory Visit: Payer: Self-pay

## 2023-10-29 MED ORDER — LOSARTAN POTASSIUM 25 MG PO TABS: 12.5000 mg | ORAL_TABLET | Freq: Every day | ORAL | 0 refills | Status: DC

## 2023-10-29 NOTE — Telephone Encounter (Signed)
 No further action needed at this time.

## 2023-11-12 ENCOUNTER — Other Ambulatory Visit: Payer: Self-pay | Admitting: Physician Assistant

## 2023-11-17 ENCOUNTER — Other Ambulatory Visit: Payer: Self-pay | Admitting: Obstetrics and Gynecology

## 2023-11-17 DIAGNOSIS — Z79890 Hormone replacement therapy (postmenopausal): Secondary | ICD-10-CM

## 2023-11-17 NOTE — Telephone Encounter (Signed)
 Med refill request: Progesterone  100 mg caps Last AEX:  06/16/23 with BS Next AEX: 06/22/24 BS Last MMG (if hormonal med): 09/17/23 Refill authorized: Please Advise? Last Rx sent #[phone] with 3 refills on 06/16/23 BS. Please approve or deny.

## 2023-11-26 ENCOUNTER — Ambulatory Visit (INDEPENDENT_AMBULATORY_CARE_PROVIDER_SITE_OTHER): Admitting: Family Medicine

## 2023-11-26 ENCOUNTER — Encounter: Payer: Self-pay | Admitting: Family Medicine

## 2023-11-26 VITALS — BP 124/84 | HR 69 | Temp 98.1°F | Wt 185.6 lb

## 2023-11-26 DIAGNOSIS — R5383 Other fatigue: Secondary | ICD-10-CM

## 2023-11-26 DIAGNOSIS — I10 Essential (primary) hypertension: Secondary | ICD-10-CM

## 2023-11-26 DIAGNOSIS — E663 Overweight: Secondary | ICD-10-CM | POA: Diagnosis not present

## 2023-11-26 LAB — BASIC METABOLIC PANEL WITH GFR
BUN: 7 mg/dL (ref 6–23)
CO2: 32 meq/L (ref 19–32)
Calcium: 9.3 mg/dL (ref 8.4–10.5)
Chloride: 90 meq/L — ABNORMAL LOW (ref 96–112)
Creatinine, Ser: 0.72 mg/dL (ref 0.40–1.20)
GFR: 96.49 mL/min (ref >60.00)
Glucose, Bld: 70 mg/dL (ref 70–99)
Potassium: 4 meq/L (ref 3.5–5.1)
Sodium: 127 meq/L — ABNORMAL LOW (ref 135–145)

## 2023-11-26 LAB — IBC + FERRITIN
Ferritin: 37.9 ng/mL (ref 10.0–291.0)
Iron: 83 ug/dL (ref 42–145)
Saturation Ratios: 26 % (ref 20.0–50.0)
TIBC: 319.2 ug/dL (ref 250.0–450.0)
Transferrin: 228 mg/dL (ref 212.0–360.0)

## 2023-11-26 NOTE — Patient Instructions (Addendum)
   Great to see you today.  I have refilled the medication(s) we provide.   If labs were collected or images ordered, we will inform you of  results once we have received them and reviewed. We will contact you either by echart message, or telephone call.  Please give ample time to the testing facility, and our office to run,  receive and review results. Please do not call inquiring of results, even if you can see them in your chart. We will contact you as soon as we are able. If it has been over 1 week since the test was completed, and you have not yet heard from [place], then please call us.    - echart message- for normal results that have been seen by the patient already.   - telephone call: abnormal results or if patient has not viewed results in their echart.  If a referral to a specialist was entered for you, please call us in 2 weeks if you have not heard from the specialist office to schedule.

## 2023-11-26 NOTE — Progress Notes (Signed)
 Patient ID: Dawn Scott, female  DOB: 04/20/1971, 53 y.o.   MRN: [id number] Patient Care Team    Relationship Specialty Notifications Start End  Catherine Charlies LABOR, DO PCP - General Family Medicine  06/28/15   Teressa Toribio SHAUNNA, MD (Inactive) Attending Physician Gastroenterology  08/21/21   Micah Ludwig, LCSW Social Worker Psychology  08/21/21   Elnor Longs, PA-C  Dermatology  10/27/23     Chief Complaint  Patient presents with   Hypertension    Hyponatremia. Pt is not fasting.     Subjective: Dawn Scott is a 53 y.o.  Female  present for Chronic Conditions/illness Management All past medical history, surgical history, allergies, family history, immunizations, medications and social history were updated in the electronic medical record today.  HTN/obesity/Fhx HD-mother: Pt reports compliance with amlodipine  5 mg every day,12.5 mg lisinopril.   HCTZ 25 mg was discontinued last visit secondary to significant hyponatremia Patient denies chest pain, shortness of breath, dizziness or lower extremity edema.   Pt does not daily baby ASA. Pt is not prescribed statin. RF: htn, obesity, FHX HD mother.  Coronary ca score: ZERO (05/15/2023)  Fatigue/memory:  started about April/May 2025 . Noticed she has become forgetful. More tired.  Concerned about thyroid . Thyroid , CBC within normal range.  vitamin D , folate and B12 tested 10/2023-normal.  Found to be significantly hyponatremic. Today pt reports she has seen improvement in her memory and less fatigue since stopping hctz       10/27/2023    7:41 AM 06/16/2023    8:03 AM 06/09/2023    9:00 AM 04/15/2023    7:46 AM 05/12/2022    1:16 PM  Depression screen PHQ 2/9  Decreased Interest 1 0 0 1 1  Down, Depressed, Hopeless 0 0 0 1 1  PHQ - 2 Score 1 0 0 2 2  Altered sleeping 1  0 1 0  Tired, decreased energy 2  0 1 0  Change in appetite 0  0 0 0  Feeling bad or failure about yourself  0  0 0 0  Trouble concentrating 2  0 0 2  Moving  slowly or fidgety/restless 0  0 0 0  Suicidal thoughts 0  0 0 0  PHQ-9 Score 6  0 4 4  Difficult doing work/chores Not difficult at all  Not difficult at all Not difficult at all       10/27/2023    7:41 AM 06/09/2023    9:00 AM 04/15/2023    7:46 AM 05/12/2022    1:16 PM  GAD 7 : Generalized Anxiety Score  Nervous, Anxious, on Edge 0 0 0 0  Control/stop worrying 0 0 0 0  Worry too much - different things 0 0 1 0  Trouble relaxing 0 0 1 0  Restless 0 0 0 0  Easily annoyed or irritable 0 0 0 1  Afraid - awful might happen 0 0 0 0  Total GAD 7 Score 0 0 2 1  Anxiety Difficulty Not difficult at all Not difficult at all Not difficult at all            Immunization History  Administered Date(s) Administered   Influenza,inj,Quad PF,6+ Mos 02/04/2013, 01/18/2014, 01/22/2015, 02/04/2022   Influenza-Unspecified 02/11/2021, 01/27/2023   PFIZER(Purple Top)SARS-COV-2 Vaccination 07/14/2019, 08/08/2019, 05/04/2020   Pfizer Covid-19 Vaccine Bivalent Booster 29yrs & up 02/01/2021   Pfizer(Comirnaty)Fall Seasonal Vaccine 12 years and older 07/30/2023   Tdap 11/24/2014   Unspecified SARS-COV-2  Vaccination 02/04/2022   Zoster Recombinant(Shingrix) 03/28/2022, 09/11/2022     Past Medical History:  Diagnosis Date   Allergy    Anxiety    Bloody stool 07/07/2016   C. difficile colitis 08/03/2018   Clostridioides difficile infection    Depression    Endometriosis 06/28/2015   Fall 10/16/2023   [place] the [place]- [place] and hit her head, wrist and knee. Was seen at ED and had CT> normal.   GAD (generalized anxiety disorder) 04/01/2018   Hemangioma of skin and subcutaneous tissue 08/21/2021   History of ectopic pregnancy    04/ 2000/12/10  S/P  LEFT SALPINGECTOMY   History of endometriosis    History of neoplasm 08/21/2021   History of ovarian cyst    Hypertension    Left ovarian cyst    LGSIL on Pap smear of cervix 08/20/2020   Menorrhagia with regular cycle    PONV (postoperative nausea and  vomiting)    Shingles    Uterine fibroid    Allergies  Allergen Reactions   Augmentin  [Amoxicillin -Pot Clavulanate] Other (See Comments)    Diarrhea C.diff positive.   Past Surgical History:  Procedure Laterality Date   APPENDECTOMY     DILATATION & CURETTAGE/HYSTEROSCOPY WITH MYOSURE N/A 10/30/2016   Procedure: , REMOVAL IUD, HYSTEROSCOPY, DILATION AND CURETTAGE;  Surgeon: Lavoie, Marie-Lyne, MD;  Location: [facility];  Service: Gynecology;  Laterality: N/A;   DILATION AND EVACUATION  03/10/2003   dr lavoie   retained placental tissue postpartum   DX LAPAROSCOPY/ LYSIS ADHESIONAS/  FULGERATION ENDOMETRIOSIS/  LEFT SALPINGECTOMY WITH REMOVAL ECOTPIC  08/18/2000   dr gorge   HYSTEROSCOPY WITH NOVASURE N/A 10/30/2016   Procedure: HYSTEROSCOPY WITH NOVASURE;  Surgeon: Lavoie, Marie-Lyne, MD;  Location: [facility];  Service: Gynecology;  Laterality: N/A;   LAPAROSCOPIC APPENDECTOMY  11/30/2007   SEPTOPLASTY  2009/12/10   WISDOM TOOTH EXTRACTION  1996   Family History  Problem Relation Age of Onset   Depression Mother    Arthritis Mother    Heart disease Mother    Transient ischemic attack Mother    Scoliosis Mother    Kyphosis Mother    Seizures Mother        Mini seizures at 56; took off wellbutrin    Alcohol abuse Father    Depression Father    Diabetes Father    Cancer Maternal Aunt        brain cancer   Early death Maternal Aunt        cancer   Cancer Maternal Aunt        ovarian cancer   Depression Paternal Uncle    Transient ischemic attack Maternal Grandmother    Alcohol abuse Maternal Grandfather    Leukemia Maternal Grandfather    Early death Maternal Grandfather        leukemia   Arthritis Paternal Grandmother    Dementia Paternal Grandmother    Alcohol abuse Paternal Grandfather    Early death Paternal Grandfather    Colon cancer Neg Hx    Colon polyps Neg Hx    Esophageal cancer Neg Hx    Rectal cancer Neg Hx    Stomach cancer  Neg Hx    Social History   Social History Narrative   Widower. Husband died on the job 12/11/2011.   Bachelor of fine arts, currently not working    One child, Pharmacist, community.   No tobacco or drug use, rare use of alcohol.   Drinks caffeinated beverages, takes a  daily vitamin   Wears her seatbelt, wears a bicycle, smoke detectors in the home   exercise at least 3 times a week.   Feels safe in her relationships.   Right handed    Works as Corporate treasurer    Allergies as of 11/26/2023       Reactions   Augmentin  [amoxicillin -pot Clavulanate] Other (See Comments)   Diarrhea C.diff positive.        Medication List        Accurate as of November 26, 2023  8:25 AM. If you have any questions, ask your nurse or doctor.          STOP taking these medications    hydrochlorothiazide  25 MG tablet Commonly known as: HYDRODIURIL  Stopped by: Charlies Bellini       TAKE these medications    ALIGN PO Take by mouth.   amLODipine  5 MG tablet Commonly known as: NORVASC  Take 1 tablet (5 mg total) by mouth daily.   B-12 PO Take 1 tablet by mouth every morning.   Biotin 1 MG Caps Take by mouth.   buPROPion  300 MG 24 hr tablet Commonly known as: WELLBUTRIN  XL TAKE 1 TABLET BY MOUTH DAILY   DULoxetine  60 MG capsule Commonly known as: CYMBALTA  TAKE 1 CAPSULE BY MOUTH DAILY   DULoxetine  30 MG capsule Commonly known as: CYMBALTA  TAKE 1 CAPSULE BY MOUTH DAILY   estradiol  0.05 MG/24HR patch Commonly known as: VIVELLE -DOT PLACE 1 PATCH (0.05 MG TOTAL) ONTO THE SKIN 2 (TWO) TIMES A WEEK.   folic acid  1 MG tablet Commonly known as: FOLVITE  Take 1 mg by mouth daily.   lamoTRIgine  150 MG tablet Commonly known as: LAMICTAL  Take 1 tablet (150 mg total) by mouth every evening.   loratadine 10 MG tablet Commonly known as: CLARITIN Take 10 mg by mouth every evening.   losartan  25 MG tablet Commonly known as: COZAAR  Take 0.5 tablets (12.5 mg total) by mouth daily. What changed: Another medication  with the same name was removed. Continue taking this medication, and follow the directions you see here. Changed by: Charelle Petrakis   multivitamin with minerals tablet Take 1 tablet by mouth every evening.   progesterone  100 MG capsule Commonly known as: PROMETRIUM  TAKE 1 CAPSULE BY MOUTH AT BEDTIME.   Vitamin D3 50 MCG (2000 UT) Tabs Take 4,000 Units by mouth every morning.        All past medical history, surgical history, allergies, family history, immunizations andmedications were updated in the EMR today and reviewed under the history and medication portions of their EMR.     Recent Results (from the past 2160 hours)  CBC     Status: None   Collection Time: 10/27/23  7:55 AM  Result Value Ref Range   WBC 4.8 4.0 - 10.5 K/uL   RBC 4.44 3.87 - 5.11 Mil/uL   Platelets 382.0 150.0 - 400.0 K/uL   Hemoglobin 13.1 12.0 - 15.0 g/dL   HCT 61.7 63.9 - 53.9 %   MCV 86.0 78.0 - 100.0 fl   MCHC 34.2 30.0 - 36.0 g/dL   RDW 87.0 88.4 - 84.4 %  Comp Met (CMET)     Status: Abnormal   Collection Time: 10/27/23  7:55 AM  Result Value Ref Range   Sodium 126 (L) 135 - 145 mEq/L   Potassium 3.7 3.5 - 5.1 mEq/L   Chloride 86 (L) 96 - 112 mEq/L   CO2 32 19 - 32 mEq/L   Glucose, Bld 73  70 - 99 mg/dL   BUN 9 6 - 23 mg/dL   Creatinine, Ser 9.34 0.40 - 1.20 mg/dL   Total Bilirubin 0.4 0.2 - 1.2 mg/dL   Alkaline Phosphatase 55 39 - 117 U/L   AST 19 0 - 37 U/L   ALT 11 0 - 35 U/L   Total Protein 7.1 6.0 - 8.3 g/dL   Albumin 4.6 3.5 - 5.2 g/dL   GFR 898.33 >39.99 mL/min    Comment: Calculated using the CKD-EPI Creatinine Equation (2021)   Calcium 9.8 8.4 - 10.5 mg/dL  Vitamin D  (25 hydroxy)     Status: None   Collection Time: 10/27/23  7:55 AM  Result Value Ref Range   VITD 50.50 30.00 - 100.00 ng/mL  B12 and Folate Panel     Status: Abnormal   Collection Time: 10/27/23  7:55 AM  Result Value Ref Range   Vitamin B-12 >1500 (H) 211 - 911 pg/mL   Folate >23.2 >5.9 ng/mL  TSH     Status:  None   Collection Time: 10/27/23  7:55 AM  Result Value Ref Range   TSH 2.85 0.35 - 5.50 uIU/mL     No results found.   ROS 14 pt review of systems performed and negative (unless mentioned in an HPI)  Objective: BP 124/84   Pulse 69   Temp 98.1 F (36.7 C)   Wt 185 lb 9.6 oz (84.2 kg)   LMP 06/05/2022 Comment: Very light  SpO2 97%   BMI 27.41 kg/m  Physical Exam Vitals and nursing note reviewed.  Constitutional:      General: She is not in acute distress.    Appearance: Normal appearance. She is not ill-appearing, toxic-appearing or diaphoretic.  HENT:     Head: Normocephalic and atraumatic.  Eyes:     General: No scleral icterus.       Right eye: No discharge.        Left eye: No discharge.     Extraocular Movements: Extraocular movements intact.     Conjunctiva/sclera: Conjunctivae normal.     Pupils: Pupils are equal, round, and reactive to light.  Cardiovascular:     Rate and Rhythm: Normal rate and regular rhythm.     Heart sounds: No murmur heard. Pulmonary:     Effort: Pulmonary effort is normal. No respiratory distress.     Breath sounds: Normal breath sounds. No wheezing, rhonchi or rales.  Musculoskeletal:     Cervical back: Neck supple.     Right lower leg: No edema.     Left lower leg: No edema.  Skin:    General: Skin is warm.     Findings: No rash.  Neurological:     Mental Status: She is alert and oriented to person, place, and time. Mental status is at baseline.     Motor: No weakness.     Gait: Gait normal.  Psychiatric:        Mood and Affect: Mood normal.        Behavior: Behavior normal.        Thought Content: Thought content normal.        Judgment: Judgment normal.      No results found.  Assessment/plan: Dawn Scott is a 53 y.o. female present for Chronic Conditions/illness Management HTN/Fhx HD-mother/E66.3: stable  Continue amlodipine  5 mg every day Continue losartan  12.5 mg every day She is having mild edema at end of  day- wearing compression stocking. Will wait on BMP results  and prescribe low dose spiro or lasix to hep with edema (avoiding hydrochlorothiazide  moving forward) Coronary ca score: ZERO (05/15/2023)  Bilateral lower extremity edema Continue use of compression stockings as needed.  Low-sodium diet Keep lower extremities elevated when able  Fatigue/confusion: Improved after stopping hydrochlorothiazide - suspect hyponatremia as cause. Iron ferritin levels competed today to be complete  Return if symptoms worsen or fail to improve.   Orders Placed This Encounter  Procedures   Basic Metabolic Panel (BMET)   IBC + Ferritin   No orders of the defined types were placed in this encounter.  Referral Orders  No referral(s) requested today    Electronically signed by: Charlies Bellini, DO [facility]

## 2023-11-30 ENCOUNTER — Ambulatory Visit: Payer: Self-pay | Admitting: Family Medicine

## 2023-11-30 MED ORDER — SPIRONOLACTONE 25 MG PO TABS: 12.5000 mg | ORAL_TABLET | Freq: Every day | ORAL | 1 refills | Status: DC

## 2023-11-30 MED ORDER — SODIUM CHLORIDE 1 G PO TABS: 1.0000 g | ORAL_TABLET | Freq: Every day | ORAL | 1 refills | Status: DC

## 2023-12-25 ENCOUNTER — Ambulatory Visit: Admitting: Podiatry

## 2023-12-25 ENCOUNTER — Encounter: Payer: Self-pay | Admitting: Podiatry

## 2023-12-25 ENCOUNTER — Telehealth: Payer: Self-pay | Admitting: Neurology

## 2023-12-25 ENCOUNTER — Ambulatory Visit (INDEPENDENT_AMBULATORY_CARE_PROVIDER_SITE_OTHER): Admitting: Neurology

## 2023-12-25 DIAGNOSIS — L853 Xerosis cutis: Secondary | ICD-10-CM | POA: Diagnosis not present

## 2023-12-25 DIAGNOSIS — G243 Spasmodic torticollis: Secondary | ICD-10-CM

## 2023-12-25 DIAGNOSIS — L603 Nail dystrophy: Secondary | ICD-10-CM

## 2023-12-25 MED ORDER — UREA 40 % EX CREA: 1.0000 | TOPICAL_CREAM | Freq: Two times a day (BID) | CUTANEOUS | 2 refills | Status: AC

## 2023-12-25 MED ORDER — ONABOTULINUMTOXINA 100 UNITS IJ SOLR
300.0000 [IU] | Freq: Once | INTRAMUSCULAR | Status: AC
  Administered 2023-12-25: 270 [IU] via INTRAMUSCULAR

## 2023-12-25 NOTE — Progress Notes (Signed)
 Subjective:   Patient ID: Dawn Scott, female   DOB: 53 y.o.   MRN: [id number]   HPI Chief Complaint  Patient presents with   Toe Pain    Extra skin around toe nails that's not ingrown, but painful at times.7 pain. Non diabetic.    53 year old female presents for above concerns.  She said that she does not have ingrown toenails that she gets skin around the nail.  She has a family history of this as well.  Occasional cough discomfort but she denies any drainage or pus.  She has tried different oils without significant improvement.   Review of Systems  All other systems reviewed and are negative.  Past Medical History:  Diagnosis Date   Allergy    Anxiety    Bloody stool 07/07/2016   C. difficile colitis 08/03/2018   Clostridioides difficile infection    Depression    Endometriosis 06/28/2015   Fall 10/16/2023   [place] the beach- [place] and hit her head, wrist and knee. Was seen at ED and had CT> normal.   GAD (generalized anxiety disorder) 04/01/2018   Hemangioma of skin and subcutaneous tissue 08/21/2021   History of ectopic pregnancy    04/ 2002  S/P  LEFT SALPINGECTOMY   History of endometriosis    History of neoplasm 08/21/2021   History of ovarian cyst    Hypertension    Left ovarian cyst    LGSIL on Pap smear of cervix 08/20/2020   Menorrhagia with regular cycle    PONV (postoperative nausea and vomiting)    Shingles    Uterine fibroid     Past Surgical History:  Procedure Laterality Date   APPENDECTOMY     DILATATION & CURETTAGE/HYSTEROSCOPY WITH MYOSURE N/A 10/30/2016   Procedure: , REMOVAL IUD, HYSTEROSCOPY, DILATION AND CURETTAGE;  Surgeon: Lavoie, Marie-Lyne, MD;  Location: [facility] [place];  Service: Gynecology;  Laterality: N/A;   DILATION AND EVACUATION  03/10/2003   dr lavoie   retained placental tissue postpartum   DX LAPAROSCOPY/ LYSIS ADHESIONAS/  FULGERATION ENDOMETRIOSIS/  LEFT SALPINGECTOMY WITH REMOVAL ECOTPIC  08/18/2000   dr gorge    HYSTEROSCOPY WITH NOVASURE N/A 10/30/2016   Procedure: HYSTEROSCOPY WITH NOVASURE;  Surgeon: Lavoie, Marie-Lyne, MD;  Location: [facility];  Service: Gynecology;  Laterality: N/A;   LAPAROSCOPIC APPENDECTOMY  11/30/2007   SEPTOPLASTY  2011   WISDOM TOOTH EXTRACTION  1996     Current Outpatient Medications:    amLODipine  (NORVASC ) 5 MG tablet, Take 1 tablet (5 mg total) by mouth daily., Disp: 90 tablet, Rfl: 2   Biotin 1 MG CAPS, Take by mouth., Disp: , Rfl:    buPROPion  (WELLBUTRIN  XL) 300 MG 24 hr tablet, TAKE 1 TABLET BY MOUTH DAILY, Disp: 90 tablet, Rfl: 0   Cholecalciferol (VITAMIN D3) 50 MCG (2000 UT) TABS, Take 4,000 Units by mouth every morning., Disp: 60 tablet, Rfl: 11   Cyanocobalamin  (B-12 PO), Take 1 tablet by mouth every morning. , Disp: , Rfl:    DULoxetine  (CYMBALTA ) 30 MG capsule, TAKE 1 CAPSULE BY MOUTH DAILY, Disp: 90 capsule, Rfl: 0   DULoxetine  (CYMBALTA ) 60 MG capsule, TAKE 1 CAPSULE BY MOUTH DAILY, Disp: 90 capsule, Rfl: 1   estradiol  (VIVELLE -DOT) 0.05 MG/24HR patch, PLACE 1 PATCH (0.05 MG TOTAL) ONTO THE SKIN 2 (TWO) TIMES A WEEK., Disp: 8 patch, Rfl: 7   folic acid  (FOLVITE ) 1 MG tablet, Take 1 mg by mouth daily., Disp: , Rfl:    lamoTRIgine  (  LAMICTAL ) 150 MG tablet, Take 1 tablet (150 mg total) by mouth every evening., Disp: 90 tablet, Rfl: 3   loratadine (CLARITIN) 10 MG tablet, Take 10 mg by mouth every evening. , Disp: , Rfl:    losartan  (COZAAR ) 25 MG tablet, Take 0.5 tablets (12.5 mg total) by mouth daily., Disp: 45 tablet, Rfl: 1   Multiple Vitamins-Minerals (MULTIVITAMIN WITH MINERALS) tablet, Take 1 tablet by mouth every evening. , Disp: , Rfl:    Probiotic Product (ALIGN PO), Take by mouth., Disp: , Rfl:    progesterone  (PROMETRIUM ) 100 MG capsule, TAKE 1 CAPSULE BY MOUTH AT BEDTIME., Disp: 30 capsule, Rfl: 5   sodium chloride  1 g tablet, Take 1 tablet (1 g total) by mouth daily., Disp: 90 tablet, Rfl: 1   spironolactone  (ALDACTONE ) 25 MG  tablet, Take 0.5 tablets (12.5 mg total) by mouth daily., Disp: 45 tablet, Rfl: 1   urea  (GORDONS UREA ) 40 % CREA, Apply 1 Application topically 2 (two) times daily., Disp: 198 g, Rfl: 2  Allergies  Allergen Reactions   Augmentin  [Amoxicillin -Pot Clavulanate] Other (See Comments)    Diarrhea C.diff positive.          Objective:  Physical Exam  General: AAO x3, NAD  Dermatological: Mild elevation of the toenails but there are not uniform in all the nails.  There is dry skin present in the corners close noted in the left hallux nail border.  There is no edema, erythema or signs of infection.  Vascular: Dorsalis Pedis artery and Posterior Tibial artery pedal pulses are 2/4 bilateral with immedate capillary fill time.  There is no pain with calf compression, swelling, warmth, erythema.   Neruologic: Grossly intact via light touch bilateral.   Musculoskeletal: No pain on exam.      Assessment:   53 year old female with onychodystrophy     Plan:  -Treatment options discussed including all alternatives, risks, and complications -Etiology of symptoms were discussed - As a courtesy debride the nails for any complications or bleeding.  Prescribed urea  cream to help with the dryness from the corners of the nails.  If symptoms persist or there is any signs of infection may need to proceed with essentially partial nail removal and debridement of the skin with chemical matricectomy.   Return if symptoms worsen or fail to improve.  Donnice JONELLE Fees DPM

## 2023-12-25 NOTE — Procedures (Signed)
 [facility]   Procedure Note Botox   Attending: Dr. Asberry Joni Norrod  Preoperative Diagnosis(es): Cervical Dystonia  Result History  Injections are helping  Consent obtained from: The patient Benefits discussed included, but were not limited to decreased muscle tightness, increased joint range of motion, and decreased pain.  Risk discussed included, but were not limited pain and discomfort, bleeding, bruising, excessive weakness, venous thrombosis, muscle atrophy and dysphagia.  A copy of the patient medication guide was given to the patient which explains the blackbox warning.  Patients identity and treatment sites confirmed Yes.  .  Details of Procedure: Skin was cleaned with alcohol.  A 30 gauge, 25mm  needle was introduced to the target muscle, except for posterior splenius where 27 gauge, 1.5 inch needle used.   Prior to injection, the needle plunger was aspirated to make sure the needle was not within a blood vessel.  There was no blood retrieved on aspiration.    Following is a summary of the muscles injected  And the amount of Botulinum toxin used:   Dilution 0.9% preservative free saline mixed with 100 u Botox  type A to make 10 U per 0.1cc  Injections  Location Left  Right Units Number of sites        Sternocleidomastoid  70 70 1  Splenius Capitus, posterior approach 100  100 1  Splenius Capitus, lateral approach 30  30 1   Levator Scapulae 10  10 1   Trapezius 20 x 3  60 3        TOTAL UNITS:   270    Agent: Botulinum Type A ( Onobotulinum Toxin type A ).  3 vials of Botox  were used, each containing 100 units and freshly diluted with 1 mL of sterile, non-preserved saline   Total injected (Units): 270  Total wasted (Units): 30   Pt tolerated procedure well without complications (except small area of ecchymosis over the L lateral [place]).   Reinjection is anticipated in 3 months.

## 2023-12-25 NOTE — Telephone Encounter (Signed)
 Could you see if we could switch to incobotulinumtoxin A (Xeomin) from her onabotulinumtoxinA  (Botox ) and also see if specialty pharmacy is available for her?  It would be the same dosage

## 2024-01-01 ENCOUNTER — Telehealth: Payer: Self-pay

## 2024-01-01 ENCOUNTER — Other Ambulatory Visit (HOSPITAL_COMMUNITY): Payer: Self-pay

## 2024-01-01 NOTE — Telephone Encounter (Signed)
 Pharmacy Patient Advocate Encounter  Received notification from [facility] that Prior Authorization for Xeomin 100UNIT solution has been DENIED.  Full denial letter will be uploaded to the media tab. See denial reason below.  This request was denied because you did not meet the following requirements:   The requested medication and/or diagnosis are not a covered benefit and excluded from coverage in accordance with the terms and conditions of your plan benefit. Therefore, the request has been administratively denied.   PA #/Case ID/Reference #: [id number]  *Pharmacy Benefits

## 2024-01-01 NOTE — Telephone Encounter (Signed)
 Pharmacy Patient Advocate Encounter   Received notification from Pt Calls Messages that prior authorization for Xeomin 100UNIT solution is required/requested.   Insurance verification completed.   The patient is insured through [facility] .   Per test claim: PA required; PA submitted to above mentioned insurance via Latent Key/confirmation #/EOC [id number] Status is pending

## 2024-01-01 NOTE — Telephone Encounter (Signed)
 Pharmacy Benefits Submitted

## 2024-01-06 ENCOUNTER — Other Ambulatory Visit (HOSPITAL_COMMUNITY): Payer: Self-pay

## 2024-01-06 NOTE — Telephone Encounter (Signed)
 XEOMIN BIV is pending.

## 2024-01-06 NOTE — Telephone Encounter (Signed)
 Pharmacy Patient Advocate Encounter  Xeomin verification has been submitted. Benefit Verification #:  [id number]  Status is pending

## 2024-01-10 ENCOUNTER — Encounter: Payer: Self-pay | Admitting: Family Medicine

## 2024-01-11 ENCOUNTER — Other Ambulatory Visit: Payer: Self-pay

## 2024-01-11 MED ORDER — COVID-19 MRNA VAC-TRIS(PFIZER) 30 MCG/0.3ML IM SUSY: 0.3000 mL | PREFILLED_SYRINGE | Freq: Once | INTRAMUSCULAR | 0 refills | Status: AC

## 2024-01-14 ENCOUNTER — Ambulatory Visit: Admitting: Physician Assistant

## 2024-01-20 ENCOUNTER — Other Ambulatory Visit: Payer: Self-pay | Admitting: Physician Assistant

## 2024-01-21 ENCOUNTER — Encounter: Payer: Self-pay | Admitting: Family Medicine

## 2024-01-21 ENCOUNTER — Ambulatory Visit (INDEPENDENT_AMBULATORY_CARE_PROVIDER_SITE_OTHER): Admitting: Family Medicine

## 2024-01-21 VITALS — BP 120/80 | HR 72 | Temp 98.2°F | Wt 188.4 lb

## 2024-01-21 DIAGNOSIS — E663 Overweight: Secondary | ICD-10-CM | POA: Diagnosis not present

## 2024-01-21 DIAGNOSIS — Z8249 Family history of ischemic heart disease and other diseases of the circulatory system: Secondary | ICD-10-CM | POA: Diagnosis not present

## 2024-01-21 DIAGNOSIS — E871 Hypo-osmolality and hyponatremia: Secondary | ICD-10-CM | POA: Insufficient documentation

## 2024-01-21 DIAGNOSIS — I10 Essential (primary) hypertension: Secondary | ICD-10-CM

## 2024-01-21 DIAGNOSIS — R410 Disorientation, unspecified: Secondary | ICD-10-CM

## 2024-01-21 DIAGNOSIS — Z23 Encounter for immunization: Secondary | ICD-10-CM

## 2024-01-21 DIAGNOSIS — R5383 Other fatigue: Secondary | ICD-10-CM

## 2024-01-21 LAB — BASIC METABOLIC PANEL WITH GFR
BUN: 8 mg/dL (ref 6–23)
CO2: 29 meq/L (ref 19–32)
Calcium: 9.5 mg/dL (ref 8.4–10.5)
Chloride: 95 meq/L — ABNORMAL LOW (ref 96–112)
Creatinine, Ser: 0.84 mg/dL (ref 0.40–1.20)
GFR: 80.11 mL/min (ref >60.00)
Glucose, Bld: 71 mg/dL (ref 70–99)
Potassium: 4.1 meq/L (ref 3.5–5.1)
Sodium: 130 meq/L — ABNORMAL LOW (ref 135–145)

## 2024-01-21 MED ORDER — LOSARTAN POTASSIUM 25 MG PO TABS: 12.5000 mg | ORAL_TABLET | Freq: Every day | ORAL | 1 refills | Status: DC

## 2024-01-21 MED ORDER — SPIRONOLACTONE 25 MG PO TABS: 12.5000 mg | ORAL_TABLET | Freq: Every day | ORAL | 1 refills | Status: DC

## 2024-01-21 NOTE — Patient Instructions (Addendum)

## 2024-01-21 NOTE — Progress Notes (Signed)
 Patient ID: Dawn Scott, female  DOB: 1970/11/07, 53 y.o.   MRN: [id number] Patient Care Team    Relationship Specialty Notifications Start End  Catherine Charlies LABOR, DO PCP - General Family Medicine  06/28/15   Teressa Toribio SHAUNNA, MD (Inactive) Attending Physician Gastroenterology  08/21/21   Micah Ludwig, LCSW Social Worker Psychology  08/21/21   Elnor Longs, PA-C  Dermatology  10/27/23     Chief Complaint  Patient presents with   Hypertension    Pt is not fasting Hyponatremia Chronic Conditions/illness Management Influenza vaccine Prevnar 20    Subjective: Dawn Scott is a 53 y.o.  Female  present for Chronic Conditions/illness Management All past medical history, surgical history, allergies, family history, immunizations, medications and social history were updated in the electronic medical record today.  HTN/obesity/Fhx HD-mother: Pt reports compliance with amlodipine  5 mg, losartan  12.5 mg daily and spironolactone  12.5 mg daily Patient denies chest pain, shortness of breath, dizziness or lower extremity edema.   Pt does not daily baby ASA. Pt is not prescribed statin. RF: htn, obesity, FHX HD mother.  Coronary ca score: ZERO (05/15/2023)  Fatigue/memory/hyponatremia:  started about April/May 2025 . Noticed she has become forgetful. More tired.  Concerned about thyroid . Thyroid , CBC within normal range.  vitamin D , folate and B12 tested 10/2023-normal.  Found to be significantly hyponatremic. Discontinued HCTZ started spironolactone  and sodium chloride  tablet once daily.  Seeing improvement in her fatigue and memory Hyponatremia possibly also caused by Cymbalta , Lamictal  and Wellbutrin  prescribed by psychology.       01/21/2024    9:13 AM 10/27/2023    7:41 AM 06/16/2023    8:03 AM 06/09/2023    9:00 AM 04/15/2023    7:46 AM  Depression screen PHQ 2/9  Decreased Interest 2 1 0 0 1  Down, Depressed, Hopeless 2 0 0 0 1  PHQ - 2 Score 4 1 0 0 2  Altered sleeping 2 1  0 1   Tired, decreased energy 2 2  0 1  Change in appetite 0 0  0 0  Feeling bad or failure about yourself  0 0  0 0  Trouble concentrating 3 2  0 0  Moving slowly or fidgety/restless 0 0  0 0  Suicidal thoughts 0 0  0 0  PHQ-9 Score 11 6  0 4  Difficult doing work/chores Somewhat difficult Not difficult at all  Not difficult at all Not difficult at all      01/21/2024    9:14 AM 10/27/2023    7:41 AM 06/09/2023    9:00 AM 04/15/2023    7:46 AM  GAD 7 : Generalized Anxiety Score  Nervous, Anxious, on Edge 0 0 0 0  Control/stop worrying 2 0 0 0  Worry too much - different things 2 0 0 1  Trouble relaxing 1 0 0 1  Restless 0 0 0 0  Easily annoyed or irritable 0 0 0 0  Afraid - awful might happen 0 0 0 0  Total GAD 7 Score 5 0 0 2  Anxiety Difficulty Somewhat difficult Not difficult at all Not difficult at all Not difficult at all       04/15/2023    7:45 AM 06/09/2023    9:00 AM 10/27/2023    7:41 AM 01/21/2024    9:13 AM 01/21/2024    9:15 AM  Fall Risk  Falls in the past year? 0 0 1 0 0  Was there  an injury with Fall? 0  1  0  Fall Risk Category Calculator 0  2  0  Patient at Risk for Falls Due to No Fall Risks    No Fall Risks  Fall risk Follow up Falls evaluation completed Falls evaluation completed Falls evaluation completed Falls evaluation completed Falls evaluation completed          Immunization History  Administered Date(s) Administered   Influenza, Seasonal, Injecte, Preservative Fre 01/25/2023, 01/21/2024   Influenza,inj,Quad PF,6+ Mos 02/04/2013, 01/18/2014, 01/22/2015, 02/04/2022   Influenza-Unspecified 02/11/2021, 01/27/2023   Moderna Covid-19 Fall Seasonal Vaccine 81yrs & older 02/05/2022   PFIZER(Purple Top)SARS-COV-2 Vaccination 07/14/2019, 08/08/2019, 05/04/2020   PNEUMOCOCCAL CONJUGATE-20 01/21/2024   Pfizer Covid-19 Vaccine Bivalent Booster 62yrs & up 02/01/2021, 09/04/2021   Pfizer(Comirnaty)Fall Seasonal Vaccine 12 years and older 01/01/2023, 07/30/2023    Tdap 11/24/2014   Unspecified SARS-COV-2 Vaccination 02/04/2022   Zoster Recombinant(Shingrix) 03/28/2022, 09/11/2022     Past Medical History:  Diagnosis Date   Allergy    Anxiety    Bloody stool 07/07/2016   C. difficile colitis 08/03/2018   Clostridioides difficile infection    Depression    Endometriosis 06/28/2015   [place] 10[place]16[place]2023   [place] her head, wrist and knee. Was seen at ED and had CT> normal.   GAD (generalized anxiety disorder) 04/01/2018   Hemangioma of skin and subcutaneous tissue 08/21/2021   History of ectopic pregnancy    04/ 2002  S/P  LEFT SALPINGECTOMY   History of endometriosis    History of neoplasm 08/21/2021   History of ovarian cyst    Hypertension    Left ovarian cyst    LGSIL on Pap smear of cervix 08/20/2020   Menorrhagia with regular cycle    PONV (postoperative nausea and vomiting)    Shingles    Uterine fibroid    Allergies  Allergen Reactions   Augmentin  [Amoxicillin -Pot Clavulanate] Other (See Comments)    Diarrhea C.diff positive.   Past Surgical History:  Procedure Laterality Date   APPENDECTOMY     DILATATION & CURETTAGE/HYSTEROSCOPY WITH MYOSURE N/A 10/30/2016   Procedure: , REMOVAL IUD, HYSTEROSCOPY, DILATION AND CURETTAGE;  Surgeon: Lavoie, Marie-Lyne, MD;  Location: [facility];  Service: Gynecology;  Laterality: N/A;   DILATION AND EVACUATION  03/10/2003   dr lavoie   retained placental tissue postpartum   DX LAPAROSCOPY/ LYSIS ADHESIONAS/  FULGERATION ENDOMETRIOSIS/  LEFT SALPINGECTOMY WITH REMOVAL ECOTPIC  08/18/2000   dr gorge   HYSTEROSCOPY WITH NOVASURE N/A 10/30/2016   Procedure: HYSTEROSCOPY WITH NOVASURE;  Surgeon: Lavoie, Marie-Lyne, MD;  Location: [facility];  Service: Gynecology;  Laterality: N/A;   LAPAROSCOPIC APPENDECTOMY  11/30/2007   SEPTOPLASTY  2011   WISDOM TOOTH EXTRACTION  1996   Family History  Problem Relation Age of Onset   Depression Mother     Arthritis Mother    Heart disease Mother    Transient ischemic attack Mother    Scoliosis Mother    Kyphosis Mother    Seizures Mother        Mini seizures at 27; took off wellbutrin    Alcohol abuse Father    Depression Father    Diabetes Father    Cancer Maternal Aunt        brain cancer   Early death Maternal Aunt        cancer   Cancer Maternal Aunt        ovarian cancer   Depression Paternal  Uncle    Transient ischemic attack Maternal Grandmother    Alcohol abuse Maternal Grandfather    Leukemia Maternal Grandfather    Early death Maternal Grandfather        leukemia   Arthritis Paternal Grandmother    Dementia Paternal Grandmother    Alcohol abuse Paternal Grandfather    Early death Paternal Grandfather    Colon cancer Neg Hx    Colon polyps Neg Hx    Esophageal cancer Neg Hx    Rectal cancer Neg Hx    Stomach cancer Neg Hx    Social History   Social History Narrative   Widower. Husband died on the job 2012-01-27.   Bachelor of fine arts, currently not working    One child, Pharmacist, community.   No tobacco or drug use, rare use of alcohol.   Drinks caffeinated beverages, takes a daily vitamin   Wears her seatbelt, wears a bicycle, smoke detectors in the home   exercise at least 3 times a week.   Feels safe in her relationships.   Right handed    Works as Corporate treasurer    Allergies as of 01/21/2024       Reactions   Augmentin  [amoxicillin -pot Clavulanate] Other (See Comments)   Diarrhea C.diff positive.        Medication List        Accurate as of January 21, 2024  9:52 AM. If you have any questions, ask your nurse or doctor.          STOP taking these medications    hydrochlorothiazide  25 MG tablet Commonly known as: HYDRODIURIL  Stopped by: Charlies Bellini   loratadine 10 MG tablet Commonly known as: CLARITIN Stopped by: Charlies Bellini       TAKE these medications    ALIGN PO Take by mouth.   amLODipine  5 MG tablet Commonly known as: NORVASC  Take 1  tablet (5 mg total) by mouth daily.   B-12 PO Take 1 tablet by mouth every morning.   Biotin 1 MG Caps Take by mouth.   buPROPion  300 MG 24 hr tablet Commonly known as: WELLBUTRIN  XL TAKE 1 TABLET BY MOUTH DAILY   DULoxetine  60 MG capsule Commonly known as: CYMBALTA  TAKE 1 CAPSULE BY MOUTH DAILY   DULoxetine  30 MG capsule Commonly known as: CYMBALTA  TAKE 1 CAPSULE BY MOUTH DAILY   estradiol  0.05 MG/24HR patch Commonly known as: VIVELLE -DOT PLACE 1 PATCH (0.05 MG TOTAL) ONTO THE SKIN 2 (TWO) TIMES A WEEK.   folic acid  1 MG tablet Commonly known as: FOLVITE  Take 1 mg by mouth daily.   lamoTRIgine  150 MG tablet Commonly known as: LAMICTAL  Take 1 tablet (150 mg total) by mouth every evening.   losartan  25 MG tablet Commonly known as: COZAAR  Take 0.5 tablets (12.5 mg total) by mouth daily.   multivitamin with minerals tablet Take 1 tablet by mouth every evening.   progesterone  100 MG capsule Commonly known as: PROMETRIUM  TAKE 1 CAPSULE BY MOUTH AT BEDTIME.   sodium chloride  1 g tablet Take 1 tablet (1 g total) by mouth daily.   spironolactone  25 MG tablet Commonly known as: ALDACTONE  Take 0.5 tablets (12.5 mg total) by mouth daily.   urea  40 % Crea Commonly known as: Gordons Urea  Apply 1 Application topically 2 (two) times daily.   Vitamin D3 50 MCG (2000 UT) Tabs Take 4,000 Units by mouth every morning.        All past medical history, surgical history, allergies, family history, immunizations andmedications  were updated in the EMR today and reviewed under the history and medication portions of their EMR.      No results found.   ROS 14 pt review of systems performed and negative (unless mentioned in an HPI)  Objective: BP 120/80   Pulse 72   Temp 98.2 F (36.8 C)   Wt 188 lb 6.4 oz (85.5 kg)   LMP 06/05/2022 Comment: Very light  SpO2 97%   BMI 27.82 kg/m  Physical Exam Vitals and nursing note reviewed.  Constitutional:      General: She  is not in acute distress.    Appearance: Normal appearance. She is not ill-appearing, toxic-appearing or diaphoretic.  HENT:     Head: Normocephalic and atraumatic.  Eyes:     General: No scleral icterus.       Right eye: No discharge.        Left eye: No discharge.     Extraocular Movements: Extraocular movements intact.     Conjunctiva/sclera: Conjunctivae normal.     Pupils: Pupils are equal, round, and reactive to light.  Cardiovascular:     Rate and Rhythm: Normal rate and regular rhythm.     Heart sounds: No murmur heard. Pulmonary:     Effort: Pulmonary effort is normal. No respiratory distress.     Breath sounds: Normal breath sounds. No wheezing, rhonchi or rales.  Musculoskeletal:     Cervical back: Neck supple.     Right lower leg: No edema.     Left lower leg: No edema.  Skin:    General: Skin is warm.     Findings: No rash.  Neurological:     Mental Status: She is alert and oriented to person, place, and time. Mental status is at baseline.     Motor: No weakness.     Gait: Gait normal.  Psychiatric:        Mood and Affect: Mood normal.        Behavior: Behavior normal.        Thought Content: Thought content normal.        Judgment: Judgment normal.      No results found.  Assessment/plan: Dawn Scott is a 53 y.o. female present for Chronic Conditions/illness Management HTN/Fhx HD-mother/E66.3: Stable Continue amlodipine  5 mg every day-higher doses caused lower extremity edema Continue losartan  12.5 mg every day-needed for added blood pressure management, unable to taper up on amlodipine . Continue spironolactone  12.5 mg daily-added for lower extremity edema Coronary ca score: ZERO (05/15/2023)  Bilateral lower extremity edema Continue use of compression stockings as needed.  Low-sodium diet Keep lower extremities elevated when able Spironolactone  12.5 mg daily  Fatigue/confusion/hyponatremia: Improved after stopping hydrochlorothiazide  and starting  sodium chloride  tablet daily. Hyponatremia likely caused by combination of both HCTZ use and Wellbutrin , Cymbalta  and Lamictal  prescribed by psychiatry. Continue sodium chloride  1 tab daily BMP collected today  Return in about 4 months (around 05/30/2024) for cpe (20 min), Routine chronic condition follow-up.   Orders Placed This Encounter  Procedures   Flu vaccine trivalent PF, 6mos and older(Flulaval,Afluria,Fluarix,Fluzone)   Pneumococcal conjugate vaccine 20-valent   Basic Metabolic Panel (BMET)   Meds ordered this encounter  Medications   losartan  (COZAAR ) 25 MG tablet    Sig: Take 0.5 tablets (12.5 mg total) by mouth daily.    Dispense:  45 tablet    Refill:  1   spironolactone  (ALDACTONE ) 25 MG tablet    Sig: Take 0.5 tablets (12.5 mg total) by mouth  daily.    Dispense:  45 tablet    Refill:  1   Referral Orders  No referral(s) requested today    Electronically signed by: Charlies Bellini, DO [facility] [place]

## 2024-01-22 ENCOUNTER — Ambulatory Visit: Payer: Self-pay | Admitting: Family Medicine

## 2024-01-22 MED ORDER — SODIUM CHLORIDE 1 G PO TABS: 1.0000 g | ORAL_TABLET | Freq: Two times a day (BID) | ORAL | 1 refills | Status: DC

## 2024-02-08 ENCOUNTER — Other Ambulatory Visit: Payer: Self-pay | Admitting: Physician Assistant

## 2024-02-11 ENCOUNTER — Ambulatory Visit: Admitting: Family Medicine

## 2024-02-11 ENCOUNTER — Ambulatory Visit: Admitting: Physician Assistant

## 2024-03-10 ENCOUNTER — Ambulatory Visit (INDEPENDENT_AMBULATORY_CARE_PROVIDER_SITE_OTHER): Admitting: Physician Assistant

## 2024-03-10 ENCOUNTER — Encounter: Payer: Self-pay | Admitting: Physician Assistant

## 2024-03-10 DIAGNOSIS — F411 Generalized anxiety disorder: Secondary | ICD-10-CM | POA: Diagnosis not present

## 2024-03-10 DIAGNOSIS — F3342 Major depressive disorder, recurrent, in full remission: Secondary | ICD-10-CM | POA: Diagnosis not present

## 2024-03-10 DIAGNOSIS — G4700 Insomnia, unspecified: Secondary | ICD-10-CM

## 2024-03-10 DIAGNOSIS — E871 Hypo-osmolality and hyponatremia: Secondary | ICD-10-CM

## 2024-03-10 NOTE — Progress Notes (Signed)
 [facility] Med Check  Patient ID: Dawn Scott,  MRN: [id number]  PCP: Catherine Charlies LABOR, DO  Date of Evaluation: 03/10/2024 Time spent:30 minutes  Chief Complaint:  Chief Complaint   Anxiety; Depression; Follow-up    HISTORY/CURRENT STATUS: HPI For routine 51-month med check.    Sodium is staying low.  Her PCP closely monitors it. She's wondering if it has anything to do with her mental health meds.   Doing well as far as her mental health goes. She is able to enjoy things.  Energy and motivation are good.  Work is going well.   No extreme sadness, tearfulness, or feelings of hopelessness.  Sleeps well most of the time. ADLs and personal hygiene are normal.   Denies any changes in concentration, making decisions, or remembering things.  Appetite has not changed.  Weight is stable.  No panic attacks.  Occas overwhelmed but not too bothersome.   No mania, delirium, AH/VH.  No SI/HI.  Individual Medical History/ Review of Systems: Changes? :Yes   see HPI with notes and labs from PCP   Past medications for mental health diagnoses include: Klonopin , Prozac , Prosom, Ambien , Cymbalta , Wellbutrin , Lamictal    Allergies: Augmentin  [amoxicillin -pot clavulanate]  Current Medications:  Current Outpatient Medications:    Biotin 1 MG CAPS, Take by mouth., Disp: , Rfl:    Cholecalciferol (VITAMIN D3) 50 MCG (2000 UT) TABS, Take 4,000 Units by mouth every morning., Disp: 60 tablet, Rfl: 11   Cyanocobalamin  (B-12 PO), Take 1 tablet by mouth every morning. , Disp: , Rfl:    DULoxetine  (CYMBALTA ) 30 MG capsule, TAKE 1 CAPSULE BY MOUTH DAILY, Disp: 90 capsule, Rfl: 1   DULoxetine  (CYMBALTA ) 60 MG capsule, TAKE 1 CAPSULE BY MOUTH DAILY, Disp: 90 capsule, Rfl: 1   estradiol  (VIVELLE -DOT) 0.05 MG/24HR patch, PLACE 1 PATCH (0.05 MG TOTAL) ONTO THE SKIN 2 (TWO) TIMES A WEEK., Disp: 8 patch, Rfl: 7   folic acid  (FOLVITE ) 1 MG tablet, Take 1 mg by mouth daily., Disp: , Rfl:    lamoTRIgine  (LAMICTAL ) 150  MG tablet, Take 1 tablet (150 mg total) by mouth every evening., Disp: 90 tablet, Rfl: 3   Multiple Vitamins-Minerals (MULTIVITAMIN WITH MINERALS) tablet, Take 1 tablet by mouth every evening. , Disp: , Rfl:    Probiotic Product (ALIGN PO), Take by mouth., Disp: , Rfl:    progesterone  (PROMETRIUM ) 100 MG capsule, TAKE 1 CAPSULE BY MOUTH AT BEDTIME., Disp: 30 capsule, Rfl: 5   spironolactone  (ALDACTONE ) 25 MG tablet, Take 0.5 tablets (12.5 mg total) by mouth daily., Disp: 45 tablet, Rfl: 1   amLODipine  (NORVASC ) 5 MG tablet, Take 1 tablet (5 mg total) by mouth daily. (Patient not taking: Reported on 03/10/2024), Disp: 90 tablet, Rfl: 2   losartan  (COZAAR ) 25 MG tablet, Take 0.5 tablets (12.5 mg total) by mouth daily., Disp: 45 tablet, Rfl: 1   sodium chloride  1 g tablet, Take 1 tablet (1 g total) by mouth 2 (two) times daily with a meal., Disp: 90 tablet, Rfl: 1   urea  (GORDONS UREA ) 40 % CREA, Apply 1 Application topically 2 (two) times daily., Disp: 198 g, Rfl: 2 Medication Side Effects: none  Family Medical/ Social History: Changes?  no  MENTAL HEALTH EXAM:  Last menstrual period 06/05/2022.There is no height or weight on file to calculate BMI.  General Appearance: Casual, Neat and Well Groomed  Eye Contact:  Good  Speech:  Clear and Coherent and Normal Rate  Volume:  Normal  Mood:  Euthymic  Affect:  Congruent  Thought Process:  Goal Directed and Descriptions of Associations: Circumstantial  Orientation:  Full (Time, Place, and Person)  Thought Content: Logical   Suicidal Thoughts:  No  Homicidal Thoughts:  No  Memory:  WNL  Judgement:  Good  Insight:  Good  Psychomotor Activity:  Normal  Concentration:  Concentration: Good and Attention Span: Good  Recall:  Good  Fund of Knowledge: Good  Language: Good  Assets:  Communication Skills Desire for Improvement Financial Resources/Insurance Housing Resilience Transportation Vocational/Educational  ADL's:  Intact  Cognition:  WNL  Prognosis:  Good   Labs reviewed, most recent was 01/21/2024.  DIAGNOSES:    ICD-10-CM   1. Recurrent major depressive disorder, in full remission  F33.42     2. Generalized anxiety disorder  F41.1     3. Insomnia, unspecified type  G47.00     4. Hyponatremia  E87.1       Receiving Psychotherapy: No    RECOMMENDATIONS:  PDMP was reviewed.  No recent controlled substances. I provided approximately  30 minutes of face to face time during this encounter, including time spent before and after the visit in records review, medical decision making, counseling pertinent to today's visit, and charting.   We disc the low sodium.  Any of the 3 psych meds can cause it, or be a contributing factor.  I disc w/ Dr. Geoffry. We decided to d/c the Wellbutrin .  Recheck Na in 3-4 weeks.  If still low, will decrease Cymbalta  next.  She knows it may be a slow process to figure out the exact med that may be decreasing the Na.  And if she doesn't tolerate any of these changes, may need to consider adding Na tabs if we absolutely have to continue the psych med.   Stop Wellbutrin .   Continue Cymbalta  30 mg +60 mg daily. Continue Lamictal  150 mg daily. Continue multivitamin, vitamin D , B12, and biotin. Return in 6-8 weeks.   Verneita Cooks, PA-C

## 2024-03-11 ENCOUNTER — Encounter: Payer: Self-pay | Admitting: Family Medicine

## 2024-04-06 ENCOUNTER — Other Ambulatory Visit: Payer: Self-pay | Admitting: Physician Assistant

## 2024-04-06 ENCOUNTER — Other Ambulatory Visit: Payer: Self-pay | Admitting: Family Medicine

## 2024-04-06 ENCOUNTER — Telehealth: Payer: Self-pay

## 2024-04-06 NOTE — Telephone Encounter (Signed)
 We are currently doing buy and bill for this patients Botox . Dr. Evonnie wanted to know if you could try to run Xeomin as medical vs pharmacy and see if we could do 300 units Xeomin buy and bill [id number]

## 2024-04-07 NOTE — Telephone Encounter (Signed)
 Buy and bill covers the medications, so the quantity would still be covered.

## 2024-04-08 ENCOUNTER — Ambulatory Visit: Admitting: Neurology

## 2024-04-08 ENCOUNTER — Ambulatory Visit (INDEPENDENT_AMBULATORY_CARE_PROVIDER_SITE_OTHER): Admitting: Family Medicine

## 2024-04-08 ENCOUNTER — Encounter: Payer: Self-pay | Admitting: Family Medicine

## 2024-04-08 VITALS — BP 120/80 | HR 78 | Temp 98.2°F | Wt 190.8 lb

## 2024-04-08 DIAGNOSIS — E871 Hypo-osmolality and hyponatremia: Secondary | ICD-10-CM

## 2024-04-08 DIAGNOSIS — E663 Overweight: Secondary | ICD-10-CM

## 2024-04-08 DIAGNOSIS — G243 Spasmodic torticollis: Secondary | ICD-10-CM

## 2024-04-08 DIAGNOSIS — Z8249 Family history of ischemic heart disease and other diseases of the circulatory system: Secondary | ICD-10-CM

## 2024-04-08 DIAGNOSIS — I10 Essential (primary) hypertension: Secondary | ICD-10-CM

## 2024-04-08 MED ORDER — INCOBOTULINUMTOXINA 100 UNITS IM SOLR
300.0000 [IU] | INTRAMUSCULAR | Status: AC
  Administered 2024-04-08: 270 [IU] via INTRAMUSCULAR

## 2024-04-08 MED ORDER — LOSARTAN POTASSIUM 25 MG PO TABS: 12.5000 mg | ORAL_TABLET | Freq: Every day | ORAL | 1 refills | Status: DC

## 2024-04-08 MED ORDER — SPIRONOLACTONE 25 MG PO TABS: 12.5000 mg | ORAL_TABLET | Freq: Every day | ORAL | 1 refills | Status: DC

## 2024-04-08 MED ORDER — SODIUM CHLORIDE 1 G PO TABS: 1.0000 g | ORAL_TABLET | Freq: Two times a day (BID) | ORAL | 1 refills | Status: DC

## 2024-04-08 MED ORDER — AMLODIPINE BESYLATE 5 MG PO TABS: 5.0000 mg | ORAL_TABLET | Freq: Every day | ORAL | 2 refills | Status: DC

## 2024-04-08 NOTE — Progress Notes (Signed)
 Patient ID: Dawn Scott, female  DOB: [birth year], 53 y.o.   MRN: [id number] Patient Care Team    Relationship Specialty Notifications Start End  Catherine Charlies LABOR, DO PCP - General Family Medicine  06/28/15   Teressa Toribio SHAUNNA, MD (Inactive) Attending Physician Gastroenterology  08/21/21   Micah Ludwig, LCSW Social Worker Psychology  08/21/21   Elnor Longs, PA-C  Dermatology  10/27/23     Chief Complaint  Patient presents with   Hypertension    Sodium recheck.     Subjective: Dawn Scott is a 53 y.o.  Female  present for Chronic Conditions/illness Management All past medical history, surgical history, allergies, family history, immunizations, medications and social history were updated in the electronic medical record today.  HTN/obesity/Fhx HD-mother: Pt reports compliance with amlodipine  5 mg, losartan  12.5 mg daily and spironolactone  12.5 mg daily Patient denies chest pain, shortness of breath, dizziness or lower extremity edema.  Pt does not daily baby ASA. Pt is not prescribed statin. RF: htn, obesity, FHX HD mother.  Coronary ca score: ZERO (05/15/2023)  hyponatremia:  started about April/May 2025 . Noticed she has become forgetful. More tired.  Concerned about thyroid . Thyroid , CBC within normal range.  vitamin D , folate and B12 tested 10/2023-normal.  Found to be significantly hyponatremic. Discontinued HCTZ started spironolactone  and sodium chloride  tablet once daily.  Seeing improvement in her fatigue and memory Hyponatremia possibly also caused by Cymbalta , Lamictal  and Wellbutrin  prescribed by psychology.  Since that time she has discussed with her psychology team and Wellbutrin  was tapered off.  She would like to have labs completed today to see if this had a positive effect on her sodium levels.       04/08/2024    1:21 PM 01/21/2024    9:13 AM 10/27/2023    7:41 AM 06/16/2023    8:03 AM 06/09/2023    9:00 AM  Depression screen PHQ 2/9  Decreased Interest 0 2 1 0  0  Down, Depressed, Hopeless 0 2 0 0 0  PHQ - 2 Score 0 4 1 0 0  Altered sleeping 0 2 1  0  Tired, decreased energy 1 2 2   0  Change in appetite 1 0 0  0  Feeling bad or failure about yourself  0 0 0  0  Trouble concentrating 1 3 2   0  Moving slowly or fidgety/restless 0 0 0  0  Suicidal thoughts 0 0 0  0  PHQ-9 Score 3 11  6    0   Difficult doing work/chores Not difficult at all Somewhat difficult Not difficult at all  Not difficult at all     Data saved with a previous flowsheet row definition      04/08/2024    1:21 PM 01/21/2024    9:14 AM 10/27/2023    7:41 AM 06/09/2023    9:00 AM  GAD 7 : Generalized Anxiety Score  Nervous, Anxious, on Edge 0 0 0 0  Control/stop worrying 0 2 0 0  Worry too much - different things 0 2 0 0  Trouble relaxing 0 1 0 0  Restless 0 0 0 0  Easily annoyed or irritable 1 0 0 0  Afraid - awful might happen 0 0 0 0  Total GAD 7 Score 1 5 0 0  Anxiety Difficulty Not difficult at all Somewhat difficult Not difficult at all Not difficult at all       06/09/2023    9:00  AM 10/27/2023    7:41 AM 01/21/2024    9:13 AM 01/21/2024    9:15 AM 04/08/2024    1:21 PM  Fall Risk  Falls in the past year? 0 1 0 0 0  Was there an injury with Fall?  1   0  0  Fall Risk Category Calculator  2  0 0  Patient at Risk for Falls Due to    No Fall Risks No Fall Risks  Fall risk Follow up Falls evaluation completed Falls evaluation completed Falls evaluation completed Falls evaluation completed Falls evaluation completed     Data saved with a previous flowsheet row definition          Immunization History  Administered Date(s) Administered   Influenza, Seasonal, Injecte, Preservative Fre 01/25/2023, 01/21/2024   Influenza,inj,Quad PF,6+ Mos 02/04/2013, 01/18/2014, 01/22/2015, 02/04/2022   Influenza-Unspecified 02/11/2021, 01/27/2023   Moderna Covid-19 Fall Seasonal Vaccine 68yrs & older 02/05/2022   PFIZER(Purple Top)SARS-COV-2 Vaccination 07/14/2019,  08/08/2019, 05/04/2020   PNEUMOCOCCAL CONJUGATE-20 01/21/2024   Pfizer Covid-19 Vaccine Bivalent Booster 71yrs & up 02/01/2021, 09/04/2021   Pfizer(Comirnaty)Fall Seasonal Vaccine 12 years and older 01/01/2023, 07/30/2023, 01/21/2024   Tdap 11/24/2014   Unspecified SARS-COV-2 Vaccination 02/04/2022   Zoster Recombinant(Shingrix) 03/28/2022, 09/11/2022     Past Medical History:  Diagnosis Date   Allergy    Anxiety    Bloody stool 07/07/2016   C. difficile colitis 08/03/2018   Clostridioides difficile infection    Depression    Endometriosis 06/28/2015   [place]16[place]2023   [place] head, wrist and knee. Was seen at ED and had CT> normal.   GAD (generalized anxiety disorder) 04/01/2018   Hemangioma of skin and subcutaneous tissue 08/21/2021   History of ectopic pregnancy    04/ 2002  S/P  LEFT SALPINGECTOMY   History of endometriosis    History of neoplasm 08/21/2021   History of ovarian cyst    Hypertension    Left ovarian cyst    LGSIL on Pap smear of cervix 08/20/2020   Menorrhagia with regular cycle    PONV (postoperative nausea and vomiting)    Shingles    Uterine fibroid    Allergies  Allergen Reactions   Augmentin  [Amoxicillin -Pot Clavulanate] Other (See Comments)    Diarrhea C.diff positive.   Past Surgical History:  Procedure Laterality Date   APPENDECTOMY     DILATATION & CURETTAGE/HYSTEROSCOPY WITH MYOSURE N/A 10/30/2016   Procedure: , REMOVAL IUD, HYSTEROSCOPY, DILATION AND CURETTAGE;  Surgeon: Lavoie, Marie-Lyne, MD;  Location: [facility];  Service: Gynecology;  Laterality: N/A;   DILATION AND EVACUATION  03/10/2003   dr lavoie   retained placental tissue postpartum   DX LAPAROSCOPY/ LYSIS ADHESIONAS/  FULGERATION ENDOMETRIOSIS/  LEFT SALPINGECTOMY WITH REMOVAL ECOTPIC  08/18/2000   dr gorge   HYSTEROSCOPY WITH NOVASURE N/A 10/30/2016   Procedure: HYSTEROSCOPY WITH NOVASURE;  Surgeon: Lavoie, Marie-Lyne, MD;  Location:  [facility] [place];  Service: Gynecology;  Laterality: N/A;   LAPAROSCOPIC APPENDECTOMY  11/30/2007   SEPTOPLASTY  2011   WISDOM TOOTH EXTRACTION  1996   Family History  Problem Relation Age of Onset   Depression Mother    Arthritis Mother    Heart disease Mother    Transient ischemic attack Mother    Scoliosis Mother    Kyphosis Mother    Seizures Mother        Mini seizures at 25; took off wellbutrin    Alcohol abuse Father  Depression Father    Diabetes Father    Cancer Maternal Aunt        brain cancer   Early death Maternal Aunt        cancer   Cancer Maternal Aunt        ovarian cancer   Depression Paternal Uncle    Transient ischemic attack Maternal Grandmother    Alcohol abuse Maternal Grandfather    Leukemia Maternal Grandfather    Early death Maternal Grandfather        leukemia   Arthritis Paternal Grandmother    Dementia Paternal Grandmother    Alcohol abuse Paternal Grandfather    Early death Paternal Grandfather    Colon cancer Neg Hx    Colon polyps Neg Hx    Esophageal cancer Neg Hx    Rectal cancer Neg Hx    Stomach cancer Neg Hx    Social History   Social History Narrative   Widower. Husband died on the job 04/19/12.   Bachelor of fine arts, currently not working    One child, Pharmacist, Community.   No tobacco or drug use, rare use of alcohol.   Drinks caffeinated beverages, takes a daily vitamin   Wears her seatbelt, wears a bicycle, smoke detectors in the home   exercise at least 3 times a week.   Feels safe in her relationships.   Right handed    Works as corporate treasurer    Allergies as of 04/08/2024       Reactions   Augmentin  [amoxicillin -pot Clavulanate] Other (See Comments)   Diarrhea C.diff positive.        Medication List        Accurate as of April 08, 2024  1:32 PM. If you have any questions, ask your nurse or doctor.          ALIGN PO Take by mouth.   amLODipine  5 MG tablet Commonly known as: NORVASC  Take 1 tablet (5 mg  total) by mouth daily.   B-12 PO Take 1 tablet by mouth every morning.   Biotin 1 MG Caps Take by mouth.   DULoxetine  60 MG capsule Commonly known as: CYMBALTA  TAKE 1 CAPSULE BY MOUTH DAILY   DULoxetine  30 MG capsule Commonly known as: CYMBALTA  TAKE 1 CAPSULE BY MOUTH DAILY   estradiol  0.05 MG/24HR patch Commonly known as: VIVELLE -DOT PLACE 1 PATCH (0.05 MG TOTAL) ONTO THE SKIN 2 (TWO) TIMES A WEEK.   folic acid  1 MG tablet Commonly known as: FOLVITE  Take 1 mg by mouth daily.   lamoTRIgine  150 MG tablet Commonly known as: LAMICTAL  Take 1 tablet (150 mg total) by mouth every evening.   losartan  25 MG tablet Commonly known as: COZAAR  Take 0.5 tablets (12.5 mg total) by mouth daily.   multivitamin with minerals tablet Take 1 tablet by mouth every evening.   progesterone  100 MG capsule Commonly known as: PROMETRIUM  TAKE 1 CAPSULE BY MOUTH AT BEDTIME.   sodium chloride  1 g tablet Take 1 tablet (1 g total) by mouth 2 (two) times daily with a meal.   spironolactone  25 MG tablet Commonly known as: ALDACTONE  Take 0.5 tablets (12.5 mg total) by mouth daily.   urea  40 % Crea Commonly known as: Gordons Urea  Apply 1 Application topically 2 (two) times daily.   Vitamin D3 50 MCG (2000 UT) Tabs Take 4,000 Units by mouth every morning.        All past medical history, surgical history, allergies, family history, immunizations andmedications were updated in the  EMR today and reviewed under the history and medication portions of their EMR.      No results found.   Review of Systems  Constitutional: Negative.   HENT: Negative.    Eyes: Negative.   Respiratory: Negative.    Cardiovascular: Negative.   Gastrointestinal: Negative.   Genitourinary: Negative.   Musculoskeletal: Negative.   Skin: Negative.   Neurological: Negative.   Endo/Heme/Allergies: Negative.   Psychiatric/Behavioral: Negative.    All other systems reviewed and are negative.  14 pt review of  systems performed and negative (unless mentioned in an HPI)  Objective: BP 120/80   Pulse 78   Temp 98.2 F (36.8 C)   Wt 190 lb 12.8 oz (86.5 kg)   LMP 06/05/2022 Comment: Very light  SpO2 97%   BMI 28.18 kg/m  Physical Exam Vitals and nursing note reviewed.  Constitutional:      General: She is not in acute distress.    Appearance: Normal appearance. She is not ill-appearing, toxic-appearing or diaphoretic.  HENT:     Head: Normocephalic and atraumatic.  Eyes:     General: No scleral icterus.       Right eye: No discharge.        Left eye: No discharge.     Extraocular Movements: Extraocular movements intact.     Conjunctiva/sclera: Conjunctivae normal.     Pupils: Pupils are equal, round, and reactive to light.  Cardiovascular:     Rate and Rhythm: Normal rate and regular rhythm.     Heart sounds: No murmur heard. Pulmonary:     Effort: Pulmonary effort is normal. No respiratory distress.     Breath sounds: Normal breath sounds. No wheezing, rhonchi or rales.  Musculoskeletal:     Cervical back: Neck supple.     Right lower leg: No edema.     Left lower leg: No edema.  Skin:    General: Skin is warm.     Findings: No rash.  Neurological:     Mental Status: She is alert and oriented to person, place, and time. Mental status is at baseline.     Motor: No weakness.     Gait: Gait normal.  Psychiatric:        Mood and Affect: Mood normal.        Behavior: Behavior normal.        Thought Content: Thought content normal.        Judgment: Judgment normal.      No results found.  Assessment/plan: Dawn Scott is a 53 y.o. female present for Chronic Conditions/illness Management HTN/Fhx HD-mother/E66.3: Stable Continue  amlodipine  5 mg every day-higher doses caused lower extremity edema Continue losartan  12.5 mg every day-needed for added blood pressure management, unable to taper up on amlodipine . Continue  spironolactone  12.5 mg daily-added for lower extremity  edema Coronary ca score: ZERO (05/15/2023)  Bilateral lower extremity edema Continue use of compression stockings as needed.  Low-sodium diet Keep lower extremities elevated when able Continue Spironolactone  12.5 mg daily  hyponatremia:. Hyponatremia likely caused by combination of both HCTZ use and Wellbutrin , Cymbalta  and Lamictal  prescribed by psychiatry. Continue sodium chloride  1 tab daily She has stopped Wellbutrin  4 weeks ago BMP collected today  No follow-ups on file.   Orders Placed This Encounter  Procedures   Basic Metabolic Panel (BMET)   Meds ordered this encounter  Medications   amLODipine  (NORVASC ) 5 MG tablet    Sig: Take 1 tablet (5 mg total) by mouth daily.  Dispense:  90 tablet    Refill:  2    90 days only   spironolactone  (ALDACTONE ) 25 MG tablet    Sig: Take 0.5 tablets (12.5 mg total) by mouth daily.    Dispense:  45 tablet    Refill:  1   sodium chloride  1 g tablet    Sig: Take 1 tablet (1 g total) by mouth 2 (two) times daily with a meal.    Dispense:  90 tablet    Refill:  1   losartan  (COZAAR ) 25 MG tablet    Sig: Take 0.5 tablets (12.5 mg total) by mouth daily.    Dispense:  45 tablet    Refill:  1   Referral Orders  No referral(s) requested today    Electronically signed by: Charlies Bellini, DO [facility]

## 2024-04-08 NOTE — Procedures (Signed)
 [facility]   Procedure Note Botox   Attending: Dr. Asberry Yared Barefoot  Preoperative Diagnosis(es): Cervical Dystonia  Result History  Injections are helping  Current toxin: Incobotulinumtoxin A Prior toxin: Onobotulinum toxinA  Consent obtained from: The patient Benefits discussed included, but were not limited to decreased muscle tightness, increased joint range of motion, and decreased pain.  Risk discussed included, but were not limited pain and discomfort, bleeding, bruising, excessive weakness, venous thrombosis, muscle atrophy and dysphagia.  A copy of the patient medication guide was given to the patient which explains the blackbox warning.  Patients identity and treatment sites confirmed Yes.  .  Details of Procedure: Skin was cleaned with alcohol.  A 30 gauge, 25mm  needle was introduced to the target muscle, except for posterior splenius where 27 gauge, 1.5 inch needle used.   Prior to injection, the needle plunger was aspirated to make sure the needle was not within a blood vessel.  There was no blood retrieved on aspiration.    Following is a summary of the muscles injected  And the amount of incobotulinumtoxin A used:   Dilution 0.9% preservative free saline mixed with 100 u incobotulinumtoxin A to make 10 U per 0.1cc  Injections  Location Left  Right Units Number of sites        Sternocleidomastoid  70 70 1  Splenius Capitus, posterior approach 100  100 1  Splenius Capitus, lateral approach 30  30 1   Levator Scapulae 10  10 1   Trapezius 20 x 3  60 3        TOTAL UNITS:   270    Agent: Incobotulinumtoxin A.  3 vials of Botox  were used, each containing 100 units and freshly diluted with 1 mL of sterile, non-preserved saline   Total injected (Units): 270  Total wasted (Units): 30   Pt tolerated procedure well without complications  Reinjection is anticipated in 3 months.

## 2024-04-09 LAB — BASIC METABOLIC PANEL WITH GFR
BUN: 9 mg/dL (ref 7–25)
CO2: 31 mmol/L (ref 20–32)
Calcium: 9.5 mg/dL (ref 8.6–10.4)
Chloride: 95 mmol/L — ABNORMAL LOW (ref 98–110)
Creat: 0.71 mg/dL (ref 0.50–1.03)
Glucose, Bld: 95 mg/dL (ref 65–99)
Potassium: 3.9 mmol/L (ref 3.5–5.3)
Sodium: 133 mmol/L — ABNORMAL LOW (ref 135–146)
eGFR: 102 mL/min/1.73m2 (ref >=60)

## 2024-04-11 ENCOUNTER — Observation Stay (HOSPITAL_COMMUNITY)
Admission: EM | Admit: 2024-04-11 | Discharge: 2024-04-13 | DRG: 641 | Disposition: A | Attending: Internal Medicine | Admitting: Internal Medicine

## 2024-04-11 ENCOUNTER — Encounter (HOSPITAL_COMMUNITY): Payer: Self-pay

## 2024-04-11 ENCOUNTER — Emergency Department (HOSPITAL_COMMUNITY)

## 2024-04-11 ENCOUNTER — Ambulatory Visit: Payer: Self-pay | Admitting: Family Medicine

## 2024-04-11 ENCOUNTER — Other Ambulatory Visit: Payer: Self-pay

## 2024-04-11 DIAGNOSIS — I10 Essential (primary) hypertension: Secondary | ICD-10-CM | POA: Diagnosis present

## 2024-04-11 DIAGNOSIS — Z79899 Other long term (current) drug therapy: Secondary | ICD-10-CM

## 2024-04-11 DIAGNOSIS — M25512 Pain in left shoulder: Secondary | ICD-10-CM | POA: Diagnosis present

## 2024-04-11 DIAGNOSIS — Z880 Allergy status to penicillin: Secondary | ICD-10-CM

## 2024-04-11 DIAGNOSIS — F32A Depression, unspecified: Secondary | ICD-10-CM | POA: Diagnosis present

## 2024-04-11 DIAGNOSIS — Z8249 Family history of ischemic heart disease and other diseases of the circulatory system: Secondary | ICD-10-CM

## 2024-04-11 DIAGNOSIS — E871 Hypo-osmolality and hyponatremia: Principal | ICD-10-CM | POA: Diagnosis present

## 2024-04-11 DIAGNOSIS — Z9049 Acquired absence of other specified parts of digestive tract: Secondary | ICD-10-CM

## 2024-04-11 DIAGNOSIS — F419 Anxiety disorder, unspecified: Secondary | ICD-10-CM | POA: Diagnosis present

## 2024-04-11 DIAGNOSIS — Z818 Family history of other mental and behavioral disorders: Secondary | ICD-10-CM

## 2024-04-11 LAB — URINALYSIS, ROUTINE W REFLEX MICROSCOPIC
Bilirubin Urine: NEGATIVE
Glucose, UA: NEGATIVE mg/dL
Hgb urine dipstick: NEGATIVE
Ketones, ur: 20 mg/dL — AB
Leukocytes,Ua: NEGATIVE
Nitrite: NEGATIVE
Protein, ur: 30 mg/dL — AB
Specific Gravity, Urine: 1.012 (ref 1.005–1.030)
pH: 8 (ref 5.0–8.0)

## 2024-04-11 LAB — BASIC METABOLIC PANEL WITH GFR
Anion gap: 11 (ref 5–15)
BUN: 10 mg/dL (ref 6–20)
CO2: 25 mmol/L (ref 22–32)
Calcium: 9 mg/dL (ref 8.9–10.3)
Chloride: 86 mmol/L — ABNORMAL LOW (ref 98–111)
Creatinine, Ser: 0.67 mg/dL (ref 0.44–1.00)
GFR, Estimated: >60 mL/min (ref >60)
Glucose, Bld: 102 mg/dL — ABNORMAL HIGH (ref 70–99)
Potassium: 3.6 mmol/L (ref 3.5–5.1)
Sodium: 122 mmol/L — ABNORMAL LOW (ref 135–145)

## 2024-04-11 LAB — CBC
HCT: 38.1 % (ref 36.0–46.0)
Hemoglobin: 13.1 g/dL (ref 12.0–15.0)
MCH: 29.4 pg (ref 26.0–34.0)
MCHC: 34.4 g/dL (ref 30.0–36.0)
MCV: 85.6 fL (ref 80.0–100.0)
Platelets: 322 K/uL (ref 150–400)
RBC: 4.45 MIL/uL (ref 3.87–5.11)
RDW: 12.6 % (ref 11.5–15.5)
WBC: 6.4 K/uL (ref 4.0–10.5)
nRBC: 0 % (ref 0.0–0.2)

## 2024-04-11 LAB — TROPONIN T, HIGH SENSITIVITY: Troponin T High Sensitivity: <15 ng/L (ref 0–19)

## 2024-04-11 LAB — LIPASE, BLOOD: Lipase: 21 U/L (ref 11–51)

## 2024-04-11 LAB — OSMOLALITY: Osmolality: 261 mosm/kg — ABNORMAL LOW (ref 275–295)

## 2024-04-11 MED ORDER — CYCLOBENZAPRINE HCL 10 MG PO TABS
10.0000 mg | ORAL_TABLET | Freq: Once | ORAL | Status: AC
  Administered 2024-04-11: 17:00:00 10 mg via ORAL
  Filled 2024-04-11: qty 1

## 2024-04-11 MED ORDER — HYDROCODONE-ACETAMINOPHEN 5-325 MG PO TABS
1.0000 | ORAL_TABLET | Freq: Four times a day (QID) | ORAL | Status: DC | PRN
  Administered 2024-04-12: 01:00:00 1 via ORAL
  Filled 2024-04-11: qty 1

## 2024-04-11 MED ORDER — SODIUM CHLORIDE 0.9 % IV BOLUS
500.0000 mL | Freq: Once | INTRAVENOUS | Status: AC
  Administered 2024-04-11: 19:00:00 500 mL via INTRAVENOUS

## 2024-04-11 MED ORDER — ONDANSETRON 4 MG PO TBDP
4.0000 mg | ORAL_TABLET | Freq: Three times a day (TID) | ORAL | Status: DC | PRN
  Administered 2024-04-11: 20:00:00 4 mg via ORAL
  Filled 2024-04-11: qty 1

## 2024-04-11 MED ORDER — ENOXAPARIN SODIUM 40 MG/0.4ML IJ SOSY
40.0000 mg | PREFILLED_SYRINGE | INTRAMUSCULAR | Status: DC
  Administered 2024-04-12 – 2024-04-13 (×2): 40 mg via SUBCUTANEOUS
  Filled 2024-04-11 (×2): qty 0.4

## 2024-04-11 MED ORDER — ONDANSETRON HCL 4 MG PO TABS: 4.0000 mg | ORAL_TABLET | Freq: Four times a day (QID) | ORAL | Status: DC | PRN

## 2024-04-11 MED ORDER — HYDROMORPHONE HCL 1 MG/ML IJ SOLN
1.0000 mg | Freq: Once | INTRAMUSCULAR | Status: AC
  Administered 2024-04-11: 19:00:00 1 mg via INTRAVENOUS
  Filled 2024-04-11: qty 1

## 2024-04-11 MED ORDER — ONDANSETRON 4 MG PO TBDP
4.0000 mg | ORAL_TABLET | Freq: Once | ORAL | Status: AC
  Administered 2024-04-11: 17:00:00 4 mg via ORAL
  Filled 2024-04-11: qty 1

## 2024-04-11 MED ORDER — ACETAMINOPHEN 650 MG RE SUPP
650.0000 mg | Freq: Four times a day (QID) | RECTAL | Status: DC | PRN
  Administered 2024-04-12: 06:00:00 650 mg via RECTAL
  Filled 2024-04-11: qty 1

## 2024-04-11 MED ORDER — POTASSIUM CHLORIDE IN NACL 20-0.9 MEQ/L-% IV SOLN
INTRAVENOUS | Status: DC
  Filled 2024-04-11 (×2): qty 1000

## 2024-04-11 MED ORDER — SODIUM CHLORIDE 0.9 % IV SOLN: INTRAVENOUS | Status: DC

## 2024-04-11 MED ORDER — METHOCARBAMOL 500 MG PO TABS
500.0000 mg | ORAL_TABLET | Freq: Three times a day (TID) | ORAL | Status: AC
  Administered 2024-04-11 – 2024-04-12 (×3): 500 mg via ORAL
  Filled 2024-04-11 (×3): qty 1

## 2024-04-11 MED ORDER — ONDANSETRON HCL 4 MG/2ML IJ SOLN
4.0000 mg | Freq: Four times a day (QID) | INTRAMUSCULAR | Status: DC | PRN
  Administered 2024-04-12 (×2): 4 mg via INTRAVENOUS
  Filled 2024-04-11 (×2): qty 2

## 2024-04-11 MED ORDER — POLYETHYLENE GLYCOL 3350 17 G PO PACK: 17.0000 g | PACK | Freq: Every day | ORAL | Status: DC | PRN

## 2024-04-11 MED ORDER — ACETAMINOPHEN 325 MG PO TABS: 650.0000 mg | ORAL_TABLET | Freq: Four times a day (QID) | ORAL | Status: DC | PRN

## 2024-04-11 NOTE — ED Triage Notes (Signed)
 Left shoulder pain that started this AM, c/o nausea. Denies injury to shoulder

## 2024-04-11 NOTE — ED Provider Notes (Signed)
 [place] EMERGENCY DEPARTMENT AT [facility] Provider Note   CSN: [id number] Arrival date & time: 04/11/24  1517     Patient presents with: Shoulder Pain   Dawn Scott is a 53 y.o. female with a history of hyponatremia and hypertension, presents to the ED with left shoulder pain that began this morning after getting out of bed.  Patient states that the pain has been constant since onset, positional, and is sharp in nature.  Patient states that she has tried ibuprofen, tylenol , and heating pad at home with only minimal relief.  Associated symptoms include wax/wane nausea without vomiting. The patient reports no cough, shortness of breath, or chest pain.  The patient denies any fevers, diarrhea, or constipation.  The patient denies any abdominal pain. No recent travel. No sick contacts.  Patient is in no acute distress.   Shoulder Pain Associated symptoms: back pain        Prior to Admission medications  Medication Sig Start Date End Date Taking? Authorizing Provider  Biotin 1 MG CAPS Take by mouth.   Yes [provider]  Cholecalciferol (VITAMIN D3) 50 MCG (2000 UT) TABS Take 4,000 Units by mouth every morning. 12/07/20  Yes Hurst, Verneita T, PA-C  Cyanocobalamin  (B-12 PO) Take 1 tablet by mouth every morning.    Yes [provider]  DULoxetine  (CYMBALTA ) 30 MG capsule TAKE 1 CAPSULE BY MOUTH DAILY 01/21/24  Yes Hurst, Teresa T, PA-C  DULoxetine  (CYMBALTA ) 60 MG capsule TAKE 1 CAPSULE BY MOUTH DAILY 04/08/24  Yes Hurst, Teresa T, PA-C  estradiol  (VIVELLE -DOT) 0.05 MG/24HR patch PLACE 1 PATCH (0.05 MG TOTAL) ONTO THE SKIN 2 (TWO) TIMES A WEEK. 10/19/23  Yes Cathlyn JAYSON Nikki Bobie FORBES, MD  folic acid  (FOLVITE ) 1 MG tablet Take 1 mg by mouth daily.   Yes [provider]  lamoTRIgine  (LAMICTAL ) 150 MG tablet Take 1 tablet (150 mg total) by mouth every evening. 07/09/23  Yes Rhys Verneita T, PA-C  losartan  (COZAAR ) 25 MG tablet Take 0.5 tablets (12.5 mg  total) by mouth daily. 04/08/24  Yes Kuneff, Renee A, DO  Multiple Vitamins-Minerals (MULTIVITAMIN WITH MINERALS) tablet Take 1 tablet by mouth every evening.    Yes [provider]  Probiotic Product (ALIGN PO) Take by mouth.   Yes [provider]  progesterone  (PROMETRIUM ) 100 MG capsule TAKE 1 CAPSULE BY MOUTH AT BEDTIME. 11/17/23  Yes Chrzanowski, Jami B, NP  sodium chloride  1 g tablet Take 1 tablet (1 g total) by mouth 2 (two) times daily with a meal. 04/08/24  Yes Kuneff, Renee A, DO  spironolactone  (ALDACTONE ) 25 MG tablet Take 0.5 tablets (12.5 mg total) by mouth daily. 04/08/24  Yes Kuneff, Renee A, DO  urea  (GORDONS UREA ) 40 % CREA Apply 1 Application topically 2 (two) times daily. 12/25/23  Yes Gershon Donnice SAUNDERS, DPM  amLODipine  (NORVASC ) 5 MG tablet Take 1 tablet (5 mg total) by mouth daily. Patient not taking: Reported on 04/12/2024 04/08/24   Catherine Fuller A, DO    Allergies: Augmentin  [amoxicillin -pot clavulanate]    Review of Systems  Musculoskeletal:  Positive for back pain.    Updated Vital Signs BP (!) 165/98 (BP Location: Left Arm)   Pulse 78   Temp 98.3 F (36.8 C) (Oral)   Resp 17   Ht 5' 9 (1.753 m)   Wt 82.6 kg   LMP 06/05/2022 Comment: Very light  SpO2 100%   BMI 26.88 kg/m   Physical Exam Vitals and  nursing note reviewed.  Constitutional:      General: She is not in acute distress.    Appearance: Normal appearance.  HENT:     Head: Normocephalic and atraumatic.  Eyes:     Extraocular Movements: Extraocular movements intact.     Conjunctiva/sclera: Conjunctivae normal.     Pupils: Pupils are equal, round, and reactive to light.  Cardiovascular:     Rate and Rhythm: Normal rate and regular rhythm.     Pulses: Normal pulses.          Radial pulses are 2+ on the right side and 2+ on the left side.  Pulmonary:     Effort: Pulmonary effort is normal. No respiratory distress.     Comments: Patient has no difficulty speaking complete  sentences Abdominal:     General: Abdomen is flat.     Palpations: Abdomen is soft.     Tenderness: There is no abdominal tenderness.     Comments: No abdominal pain on palpation. There is no referred pain on palpation that extends to patients back. No CVA tenderness, no guarding or rebound tenderness. No hernias appreciated on exam.   Musculoskeletal:        General: Normal range of motion.       Arms:     Cervical back: Normal range of motion.     Comments: There is moderate pain localized just inferior to the scapula.  Patient states that the pain increases slightly on palpation.  No bony tenderness.  No obvious signs of trauma, deformity, or crepitus.  No redness, rashes, or swelling.  Skin:    General: Skin is warm and dry.     Capillary Refill: Capillary refill takes less than 2 seconds.  Neurological:     General: No focal deficit present.     Mental Status: She is alert. Mental status is at baseline.  Psychiatric:        Mood and Affect: Mood normal.     (all labs ordered are listed, but only abnormal results are displayed) Labs Reviewed  BASIC METABOLIC PANEL WITH GFR - Abnormal; Notable for the following components:      Result Value   Sodium 122 (*)    Chloride 86 (*)    Glucose, Bld 102 (*)    All other components within normal limits  URINALYSIS, ROUTINE W REFLEX MICROSCOPIC - Abnormal; Notable for the following components:   APPearance CLOUDY (*)    Ketones, ur 20 (*)    Protein, ur 30 (*)    Bacteria, UA MANY (*)    All other components within normal limits  OSMOLALITY - Abnormal; Notable for the following components:   Osmolality 261 (*)    All other components within normal limits  BASIC METABOLIC PANEL WITH GFR - Abnormal; Notable for the following components:   Sodium 122 (*)    Chloride 86 (*)    Glucose, Bld 107 (*)    All other components within normal limits  SODIUM - Abnormal; Notable for the following components:   Sodium 122 (*)    All other  components within normal limits  CBC  LIPASE, BLOOD  HIV ANTIBODY (ROUTINE TESTING W REFLEX)  CBC  TSH  CORTISOL-AM, BLOOD  OSMOLALITY, URINE  SODIUM  SODIUM  TROPONIN T, HIGH SENSITIVITY    EKG: EKG Interpretation Date/Time:  Monday April 11 2024 16:05:50 EST Ventricular Rate:  70 PR Interval:  164 QRS Duration:  114 QT Interval:  404 QTC Calculation: 436 R  Axis:   88  Text Interpretation: Sinus rhythm Borderline intraventricular conduction delay ST elev, probable normal early repol pattern since last tracing no significant change Confirmed by Lenor Hollering [phone]) on 04/11/2024 6:15:23 PM  Radiology: DG Chest 2 View Result Date: 04/11/2024 EXAM: 2 VIEW(S) XRAY OF THE CHEST 04/11/2024 04:29:00 PM COMPARISON: Comparison with 03/28/2013. CLINICAL HISTORY: shoulder blade pain Nausea. FINDINGS: LUNGS AND PLEURA: No focal pulmonary opacity. No pleural effusion. No pneumothorax. HEART AND MEDIASTINUM: No acute abnormality of the cardiac and mediastinal silhouettes. BONES AND SOFT TISSUES: No acute osseous abnormality. IMPRESSION: 1. No acute cardiopulmonary process. Electronically signed by: Elsie Gravely MD 04/11/2024 05:14 PM EST RP Workstation: [id number]     Procedures   Medications Ordered in the ED  methocarbamol  (ROBAXIN ) tablet 500 mg (500 mg Oral Given 04/12/24 1008)  HYDROcodone -acetaminophen  (NORCO/VICODIN) 5-325 MG per tablet 1 tablet (1 tablet Oral Given 04/12/24 0047)  enoxaparin  (LOVENOX ) injection 40 mg (40 mg Subcutaneous Given 04/12/24 1008)  acetaminophen  (TYLENOL ) tablet 650 mg ( Oral See Alternative 04/12/24 0530)    Or  acetaminophen  (TYLENOL ) suppository 650 mg (650 mg Rectal Given 04/12/24 0530)  ondansetron  (ZOFRAN ) tablet 4 mg ( Oral See Alternative 04/12/24 0651)    Or  ondansetron  (ZOFRAN ) injection 4 mg (4 mg Intravenous Given 04/12/24 0651)  polyethylene glycol (MIRALAX  / GLYCOLAX ) packet 17 g (has no administration in time range)  sodium  chloride tablet 1 g (1 g Oral Given 04/12/24 1146)  losartan  (COZAAR ) tablet 12.5 mg (12.5 mg Oral Given 04/12/24 1147)  spironolactone  (ALDACTONE ) tablet 12.5 mg (12.5 mg Oral Given 04/12/24 1146)  0.9 % NaCl with KCl 20 mEq/ L  infusion (has no administration in time range)  cyclobenzaprine  (FLEXERIL ) tablet 10 mg (10 mg Oral Given by Other 04/11/24 1717)  ondansetron  (ZOFRAN -ODT) disintegrating tablet 4 mg (4 mg Oral Given 04/11/24 1721)  sodium chloride  0.9 % bolus 500 mL (0 mLs Intravenous Stopped 04/11/24 2326)  HYDROmorphone  (DILAUDID ) injection 1 mg (1 mg Intravenous Given 04/11/24 1900)                                  Medical Decision Making Amount and/or Complexity of Data Reviewed Labs: ordered. Decision-making details documented in ED Course. Radiology: ordered. Decision-making details documented in ED Course. ECG/medicine tests:  Decision-making details documented in ED Course.  Risk Prescription drug management. Decision regarding hospitalization.   Patient presents to the ED for: left shoulder pain, nausea This involves an extensive number of treatment options Differential diagnosis includes: Minor, MSK etiology - left shoulder pain Infectious etiology - nausea  Metabolic etiology - nausea  Co-morbid conditions: hyponatremia, hypertension  Additional history/records obtained and reviewed: Additional history obtained from  outside medical records External records from outside source obtained and reviewed including multiple primary care visits reviewing patient's ongoing hyponatremia treatment regimen.  Clinical Course as of 04/12/24 1414  Mon Apr 11, 2024  1624 Temp: 97.6 F (36.4 C) Patient afebrile, vital stable, no acute distress [ML]  1625 CBC WNL [ML]  1625 EKG 12-Lead Sinus rhythm [ML]  1644 Troponin T, High Sensitivity Negative [ML]  1657 Basic metabolic panel(!) Hyponatremia -- patient had a sodium of 133 three days ago, now 122 [ML]  1756 DG  Chest 2 View No acute findings [ML]  1800 Patient was given hydromorphone , ondansetron , cyclobenzaprine  with symptomatic relief.  [ML]  1800 Patient started on 500cc NS bolus  [ML]  1821 Lipase,  blood No acute findings [ML]  1830 Urinalysis, Routine w reflex microscopic -Urine, Clean CatchMAGNUS [place] [ML]    Clinical Course User Index [ML] Willma Duwaine CROME, PA    Data Reviewed / Actions Taken: Labs ordered/reviewed with my independent interpretation in ED course above. Imaging ordered/reviewed with my independent interpretation in ED course above. I agree with the radiologists interpretation.  EKG ordered/reviewed with my independent interpretation in ED course above. The patient was kept on continuous cardiac monitoring during the ED stay.  Management / Treatments: See ED course above for medications, treatments administered, and clinical rationale.   Reevaluation of the patient after these medicines showed that the patient improved with ED pain regimen. I have reviewed the patients home medicines and have made adjustments as needed  ED Course / Reassessments: Problem List: hyponatremia, left shoulder pain, nausea 53 year old female presented for left shoulder pain. Initial assessment included history, physical exam, and review of prior medical records.  Given findings of hyponatremia and patient reporting several missed doses of her daily sodium tablets for same, patient to be admitted for further care and evaluation of worsening hyponatremia.  Patient's left shoulder pain likely musculoskeletal in nature given reassuring workup and positive response to pain management therapy.  Cardiac laboratory studies were placed by RN staff-the patient's pain is unlikely cardiac in nature given patients negative troponin and nonischemic EKG. Lipase was normal making any referred pain from pancreatitis unlikely.  Patient's nausea could be related to ongoing MSK pain or current hyponatremia, however  nausea is responding well to antiemetic therapy and patient is able to tolerate PO without difficulty.  Vital signs were obtained and monitored, and the patient remained stable throughout the ED stay.  Patient response: improved with pain management Serial reassessments performed: Yes    Consultations:  Hospitalist - Pearlean, MD Consult recommendations incorporated into plan: Admission for sodium replacement therapy.   Disposition: Disposition: Admission Rationale for disposition: admission for further evaluation and care of hyponatremia requiring sodium replacement therapy.  The disposition plan and rationale were discussed with the patient at the bedside, all questions were addressed, and the patient demonstrated understanding.  This note was produced using Electronics Engineer. While I have reviewed and verified all clinical information, transcription errors may remain.      Final diagnoses:  None    ED Discharge Orders     None          Willma Duwaine CROME, [place] 04/12/24 1417    Lenor Hollering, MD 04/12/24 1528

## 2024-04-11 NOTE — H&P (Signed)
 History and Physical    NNEOMA HARRAL FMW:[id number] DOB: Oct 07, 1970 DOA: 04/11/2024  PCP: Catherine Charlies LABOR, DO   Patient coming from: Home  I have personally briefly reviewed patient's old medical records in [facility]  Chief Complaint: Left shoulder pain  HPI: Dawn Scott is a 53 y.o. female with medical history significant for hypertension.  Patient presented to the ED with complaints of pain to her left shoulder that started this morning after getting out of bed.  She reports constant pain to her left shoulder towards the back, pain is not related or worsened by movement of her arm.  No chest pain.  No cardiac history.  She denies trauma to her shoulder or unusual exercise. Also reports onset of nausea all day prior to arrival, in the ED after she got Dilaudid  she started vomiting.  No abdominal pain.  Reports good oral intake.  No diarrhea. She is on salt tablets, she ran out about 4 days ago.  She usually gets it mailed to her.  ED Course: Temp 97.6.  Heart rate 68-77.  Respiratory rate 17-18.  Blood pressure systolic 143-167.  O2 sats greater than 96% on room air. Sodium 122.  Potassium 3.6. Troponin less than 15. EKG unchanged. 500 mL bolus given.  Review of Systems: As per HPI all other systems reviewed and negative.  Past Medical History:  Diagnosis Date   Allergy    Anxiety    Bloody stool 07/07/2016   C. difficile colitis 08/03/2018   Clostridioides difficile infection    Depression    Endometriosis 06/28/2015   [place]16[place]20[place]   [place] head, wrist and knee. Was seen at ED and had CT> normal.   GAD (generalized anxiety disorder) 04/01/2018   Hemangioma of skin and subcutaneous tissue 08/21/2021   History of ectopic pregnancy    04/ 2002  S/P  LEFT SALPINGECTOMY   History of endometriosis    History of neoplasm 08/21/2021   History of ovarian cyst    Hypertension    Left ovarian cyst    LGSIL on Pap smear of cervix 08/20/2020    Menorrhagia with regular cycle    PONV (postoperative nausea and vomiting)    Shingles    Uterine fibroid     Past Surgical History:  Procedure Laterality Date   APPENDECTOMY     DILATATION & CURETTAGE/HYSTEROSCOPY WITH MYOSURE N/A 10/30/2016   Procedure: , REMOVAL IUD, HYSTEROSCOPY, DILATION AND CURETTAGE;  Surgeon: Lavoie, Marie-Lyne, MD;  Location: [facility] [place];  Service: Gynecology;  Laterality: N/A;   DILATION AND EVACUATION  03/10/2003   dr lavoie   retained placental tissue postpartum   DX LAPAROSCOPY/ LYSIS ADHESIONAS/  FULGERATION ENDOMETRIOSIS/  LEFT SALPINGECTOMY WITH REMOVAL ECOTPIC  08/18/2000   dr gorge   HYSTEROSCOPY WITH NOVASURE N/A 10/30/2016   Procedure: HYSTEROSCOPY WITH NOVASURE;  Surgeon: Lavoie, Marie-Lyne, MD;  Location: [facility] [place];  Service: Gynecology;  Laterality: N/A;   LAPAROSCOPIC APPENDECTOMY  11/30/2007   SEPTOPLASTY  2011   WISDOM TOOTH EXTRACTION  1996     reports that she has never smoked. She has never used smokeless tobacco. She reports current alcohol use. She reports that she does not use drugs.  Allergies[1]  Family History  Problem Relation Age of Onset   Depression Mother    Arthritis Mother    Heart disease Mother    Transient ischemic attack Mother    Scoliosis Mother  Kyphosis Mother    Seizures Mother        Mini seizures at 75; took off wellbutrin    Alcohol abuse Father    Depression Father    Diabetes Father    Cancer Maternal Aunt        brain cancer   Early death Maternal Aunt        cancer   Cancer Maternal Aunt        ovarian cancer   Depression Paternal Uncle    Transient ischemic attack Maternal Grandmother    Alcohol abuse Maternal Grandfather    Leukemia Maternal Grandfather    Early death Maternal Grandfather        leukemia   Arthritis Paternal Grandmother    Dementia Paternal Grandmother    Alcohol abuse Paternal Grandfather    Early death Paternal Grandfather    Colon  cancer Neg Hx    Colon polyps Neg Hx    Esophageal cancer Neg Hx    Rectal cancer Neg Hx    Stomach cancer Neg Hx     Prior to Admission medications  Medication Sig Start Date End Date Taking? Authorizing Provider  amLODipine  (NORVASC ) 5 MG tablet Take 1 tablet (5 mg total) by mouth daily. 04/08/24   Kuneff, Renee A, DO  Biotin 1 MG CAPS Take by mouth.    [provider]  Cholecalciferol (VITAMIN D3) 50 MCG (2000 UT) TABS Take 4,000 Units by mouth every morning. 12/07/20   Rhys Boyer T, PA-C  Cyanocobalamin  (B-12 PO) Take 1 tablet by mouth every morning.     [provider]  DULoxetine  (CYMBALTA ) 30 MG capsule TAKE 1 CAPSULE BY MOUTH DAILY 01/21/24   Rhys Boyer T, PA-C  DULoxetine  (CYMBALTA ) 60 MG capsule TAKE 1 CAPSULE BY MOUTH DAILY 04/08/24   Hurst, Boyer T, PA-C  estradiol  (VIVELLE -DOT) 0.05 MG/24HR patch PLACE 1 PATCH (0.05 MG TOTAL) ONTO THE SKIN 2 (TWO) TIMES A WEEK. 10/19/23   Cathlyn JAYSON Cary, Bobie BRAVO, MD  folic acid  (FOLVITE ) 1 MG tablet Take 1 mg by mouth daily.    [provider]  lamoTRIgine  (LAMICTAL ) 150 MG tablet Take 1 tablet (150 mg total) by mouth every evening. 07/09/23   Rhys Boyer DASEN, PA-C  losartan  (COZAAR ) 25 MG tablet Take 0.5 tablets (12.5 mg total) by mouth daily. 04/08/24   Kuneff, Renee A, DO  Multiple Vitamins-Minerals (MULTIVITAMIN WITH MINERALS) tablet Take 1 tablet by mouth every evening.     [provider]  Probiotic Product (ALIGN PO) Take by mouth.    [provider]  progesterone  (PROMETRIUM ) 100 MG capsule TAKE 1 CAPSULE BY MOUTH AT BEDTIME. 11/17/23   Chrzanowski, Jami B, NP  sodium chloride  1 g tablet Take 1 tablet (1 g total) by mouth 2 (two) times daily with a meal. 04/08/24   Kuneff, Renee A, DO  spironolactone  (ALDACTONE ) 25 MG tablet Take 0.5 tablets (12.5 mg total) by mouth daily. 04/08/24   Kuneff, Renee A, DO  urea  (GORDONS UREA ) 40 % CREA Apply 1 Application topically 2 (two) times daily.  12/25/23   Gershon Donnice SAUNDERS, DPM    Physical Exam: Vitals:   04/11/24 1558 04/11/24 1601 04/11/24 1858 04/11/24 1913  BP:  (!) 143/97 (!) 154/104 (!) 160/110  Pulse:   71 75  Resp:    17  Temp:      TempSrc:      SpO2:   100% 96%  Weight: 82.6 kg     Height: 5'  9 (1.753 m)       Constitutional: NAD, calm, comfortable Vitals:   04/11/24 1558 04/11/24 1601 04/11/24 1858 04/11/24 1913  BP:  (!) 143/97 (!) 154/104 (!) 160/110  Pulse:   71 75  Resp:    17  Temp:      TempSrc:      SpO2:   100% 96%  Weight: 82.6 kg     Height: 5' 9 (1.753 m)      Eyes: PERRL, lids and conjunctivae normal ENMT: Mucous membranes are moist Neck: normal, supple, no masses, no thyromegaly Respiratory: clear to auscultation bilaterally, no wheezing, no crackles. Normal respiratory effort. No accessory muscle use.  Cardiovascular: Regular rate and rhythm, no murmurs / rubs / gallops. No extremity edema.  Extremities warm. Abdomen: no tenderness, no masses palpated. No hepatosplenomegaly. Bowel sounds positive.  Musculoskeletal: No tenderness to palpation over left shoulder, able to move left shoulder through all ranges of motion, without limitation or pain.  no clubbing / cyanosis. No joint deformity upper and lower extremities.  Skin: no rashes, lesions, ulcers. No induration Neurologic: No facial asymmetry, extremity spontaneously, speech fluent.  Psychiatric: Normal judgment and insight. Alert and oriented x 3. Normal mood.   Labs on Admission: I have personally reviewed following labs and imaging studies  CBC: Recent Labs  Lab 04/11/24 1606  WBC 6.4  HGB 13.1  HCT 38.1  MCV 85.6  PLT 322   Basic Metabolic Panel: Recent Labs  Lab 04/08/24 1324 04/11/24 1606  NA 133* 122*  K 3.9 3.6  CL 95* 86*  CO2 31 25  GLUCOSE 95 102*  BUN 9 10  CREATININE 0.71 0.67  CALCIUM 9.5 9.0   Liver Function Tests: No results for input(s): AST, ALT, ALKPHOS, BILITOT, PROT, ALBUMIN in  the last 168 hours. Recent Labs  Lab 04/11/24 1606  LIPASE 21   Urine analysis:    Component Value Date/Time   COLORURINE YELLOW 04/11/2024 1730   APPEARANCEUR CLOUDY (A) 04/11/2024 1730   LABSPEC 1.012 04/11/2024 1730   PHURINE 8.0 04/11/2024 1730   GLUCOSEU NEGATIVE 04/11/2024 1730   HGBUR NEGATIVE 04/11/2024 1730   BILIRUBINUR NEGATIVE 04/11/2024 1730   BILIRUBINUR neg 11/20/2014 1145   KETONESUR 20 (A) 04/11/2024 1730   PROTEINUR 30 (A) 04/11/2024 1730   UROBILINOGEN 0.2 11/20/2014 1145   UROBILINOGEN 0.2 03/28/2013 1536   NITRITE NEGATIVE 04/11/2024 1730   LEUKOCYTESUR NEGATIVE 04/11/2024 1730    Radiological Exams on Admission: DG Chest 2 View Result Date: 04/11/2024 EXAM: 2 VIEW(S) XRAY OF THE CHEST 04/11/2024 04:29:00 PM COMPARISON: Comparison with 03/28/2013. CLINICAL HISTORY: shoulder blade pain Nausea. FINDINGS: LUNGS AND PLEURA: No focal pulmonary opacity. No pleural effusion. No pneumothorax. HEART AND MEDIASTINUM: No acute abnormality of the cardiac and mediastinal silhouettes. BONES AND SOFT TISSUES: No acute osseous abnormality. IMPRESSION: 1. No acute cardiopulmonary process. Electronically signed by: Elsie Gravely MD 04/11/2024 05:14 PM EST RP Workstation: [id number]    EKG: Independently reviewed.  Sinus rhythm, rate 70, QTc 436.  No significant change from prior.  Assessment/Plan Principal Problem:   Acute hyponatremia Active Problems:   Depression   Essential hypertension  Assessment and Plan:  Acute on chronic hyponatremia-sodium 122, sodium last checked 3 days ago was 133.  Mildly symptomatic with nausea.  Baseline over the past year 126-133.  She is supposed to be on salt tablets she ran out of her 4 days ago.  Per up to date, during post- marketing surveillance Lamictal  caused hyponatremia in less  than 1%.  Otherwise no other culprit medications identified. -Follow-up serum and urine osmolality - Check TSH -A.m. cortisol - 500 mL bolus given,  continue N/s + 20 KCl 75 cc/h X 1 day - Resume Lamictal   Left shoulder pain-likely musculoskeletal in etiology.  No cardiac history.  Troponin less than 15.  EKG unchanged. - Muscle relaxants, hydrocodone /acetaminophen  as needed  Hypertension-stable.  Resume losartan , spironolactone   Depression - Resume Lamictal , Cymbalta   DVT prophylaxis: Lovenox  Code Status: FULL Family Communication: Sister-in-law at bedside Disposition Plan: ~ 1- 2 days Consults called: None Admission status:  Obs tele    Author: Tully FORBES Carwin, MD 04/11/2024 10:59 PM  For on call review [email or web address].      [1]  Allergies Allergen Reactions   Augmentin  [Amoxicillin -Pot Clavulanate] Other (See Comments)    Diarrhea C.diff positive.

## 2024-04-12 DIAGNOSIS — Z9049 Acquired absence of other specified parts of digestive tract: Secondary | ICD-10-CM | POA: Diagnosis not present

## 2024-04-12 DIAGNOSIS — E871 Hypo-osmolality and hyponatremia: Secondary | ICD-10-CM | POA: Diagnosis present

## 2024-04-12 DIAGNOSIS — I10 Essential (primary) hypertension: Secondary | ICD-10-CM | POA: Diagnosis present

## 2024-04-12 DIAGNOSIS — Z79899 Other long term (current) drug therapy: Secondary | ICD-10-CM | POA: Diagnosis not present

## 2024-04-12 DIAGNOSIS — M25512 Pain in left shoulder: Secondary | ICD-10-CM | POA: Diagnosis present

## 2024-04-12 DIAGNOSIS — Z880 Allergy status to penicillin: Secondary | ICD-10-CM | POA: Diagnosis not present

## 2024-04-12 DIAGNOSIS — Z818 Family history of other mental and behavioral disorders: Secondary | ICD-10-CM | POA: Diagnosis not present

## 2024-04-12 DIAGNOSIS — F419 Anxiety disorder, unspecified: Secondary | ICD-10-CM | POA: Diagnosis present

## 2024-04-12 DIAGNOSIS — F32A Depression, unspecified: Secondary | ICD-10-CM | POA: Diagnosis present

## 2024-04-12 DIAGNOSIS — Z8249 Family history of ischemic heart disease and other diseases of the circulatory system: Secondary | ICD-10-CM | POA: Diagnosis not present

## 2024-04-12 LAB — CORTISOL-AM, BLOOD: Cortisol - AM: 20.2 ug/dL (ref 6.7–22.6)

## 2024-04-12 LAB — TSH: TSH: 1.61 u[IU]/mL (ref 0.350–4.500)

## 2024-04-12 LAB — CBC
HCT: 38.2 % (ref 36.0–46.0)
Hemoglobin: 13.4 g/dL (ref 12.0–15.0)
MCH: 29.8 pg (ref 26.0–34.0)
MCHC: 35.1 g/dL (ref 30.0–36.0)
MCV: 85.1 fL (ref 80.0–100.0)
Platelets: 321 K/uL (ref 150–400)
RBC: 4.49 MIL/uL (ref 3.87–5.11)
RDW: 12.6 % (ref 11.5–15.5)
WBC: 7.1 K/uL (ref 4.0–10.5)
nRBC: 0 % (ref 0.0–0.2)

## 2024-04-12 LAB — BASIC METABOLIC PANEL WITH GFR
Anion gap: 11 (ref 5–15)
BUN: 6 mg/dL (ref 6–20)
CO2: 25 mmol/L (ref 22–32)
Calcium: 9 mg/dL (ref 8.9–10.3)
Chloride: 86 mmol/L — ABNORMAL LOW (ref 98–111)
Creatinine, Ser: 0.59 mg/dL (ref 0.44–1.00)
GFR, Estimated: >60 mL/min (ref >60)
Glucose, Bld: 107 mg/dL — ABNORMAL HIGH (ref 70–99)
Potassium: 3.6 mmol/L (ref 3.5–5.1)
Sodium: 122 mmol/L — ABNORMAL LOW (ref 135–145)

## 2024-04-12 LAB — SODIUM
Sodium: 122 mmol/L — ABNORMAL LOW (ref 135–145)
Sodium: 129 mmol/L — ABNORMAL LOW (ref 135–145)
Sodium: 134 mmol/L — ABNORMAL LOW (ref 135–145)

## 2024-04-12 LAB — HIV ANTIBODY (ROUTINE TESTING W REFLEX): HIV Screen 4th Generation wRfx: NONREACTIVE

## 2024-04-12 MED ORDER — SPIRONOLACTONE 12.5 MG HALF TABLET
12.5000 mg | ORAL_TABLET | Freq: Every day | ORAL | Status: DC
  Administered 2024-04-12 – 2024-04-13 (×2): 12.5 mg via ORAL
  Filled 2024-04-12 (×2): qty 1

## 2024-04-12 MED ORDER — POTASSIUM CHLORIDE IN NACL 20-0.9 MEQ/L-% IV SOLN
INTRAVENOUS | Status: DC
  Filled 2024-04-12 (×3): qty 1000

## 2024-04-12 MED ORDER — SODIUM CHLORIDE 1 G PO TABS
1.0000 g | ORAL_TABLET | Freq: Three times a day (TID) | ORAL | Status: DC
  Administered 2024-04-12 – 2024-04-13 (×4): 1 g via ORAL
  Filled 2024-04-12 (×5): qty 1

## 2024-04-12 MED ORDER — LOSARTAN POTASSIUM 25 MG PO TABS
12.5000 mg | ORAL_TABLET | Freq: Every day | ORAL | Status: DC
  Administered 2024-04-12 – 2024-04-13 (×2): 12.5 mg via ORAL
  Filled 2024-04-12 (×2): qty 1

## 2024-04-12 NOTE — ED Notes (Signed)
 Pharmacy consulted: stated that it was ok to take the NaCl tablet without food

## 2024-04-12 NOTE — Progress Notes (Signed)
 PROGRESS NOTE    Dawn Scott  FMW:[id number] DOB: Jan 05, 1971 DOA: 04/11/2024 PCP: Catherine Charlies LABOR, DO   Brief Narrative:  53 year old female with history of hypertension, anxiety/depression, chronic hyponatremia on oral salt tablets who ran out of salt tablets few days ago presented with left shoulder pain along with nausea.  On presentation, sodium was 122.  She was started on IV fluids.  Assessment & Plan:   Acute on chronic hyponatremia - Sodium 122 on presentation.  Baseline over the past year has been 126-133. - Patient is on oral salt tablets at home but ran out of salt tablets a few days ago.  She was also on hydrochlorothiazide  which was recently stopped as an outpatient by PCP.  She has been off Wellbutrin  for few weeks. - Currently on IV fluids.  Sodium remains at 122.  Start salt tablets.  Check sodium again later this afternoon.  Hypertension - Blood pressure on the higher side.  Resume spironolactone  and losartan .  Monitor.  Use hydralazine  as needed  Left shoulder pain - Most likely musculoskeletal in nature.  No cardiac history; troponin less than 15 on presentation with unchanged EKG. - Continue muscle relaxants, hydrocodone /acetaminophen  as needed  Depression/anxiety -Continue lamictal .  Cymbalta  on hold for now.   DVT prophylaxis: Lovenox  Code Status:  Full Family Communication: None at bedside Disposition Plan: Status is: Observation The patient will require care spanning > 2 midnights and should be moved to inpatient because: Of severity of illness    Consultants: None  Procedures: None  Antimicrobials: None   Subjective: Patient seen and examined at bedside.  Had nausea and vomiting overnight but none since this morning.  Feels weak.  No fever, chest pain reported.  Objective: Vitals:   04/12/24 0552 04/12/24 0700 04/12/24 1004 04/12/24 1009  BP: (!) 156/101 (!) 163/95  (!) 165/98  Pulse: 72 73 74 78  Resp: 11 15  17   Temp:    98.3 F (36.8  C)  TempSrc:    Oral  SpO2: 100% 100%  100%  Weight:      Height:        Intake/Output Summary (Last 24 hours) at 04/12/2024 1113 Last data filed at 04/11/2024 2326 Gross per 24 hour  Intake 500 ml  Output --  Net 500 ml   Filed Weights   04/11/24 1558  Weight: 82.6 kg    Examination:  General exam: Appears calm and comfortable  Respiratory system: Bilateral decreased breath sounds at bases, no wheezing Cardiovascular system: S1 & S2 heard, Rate controlled Gastrointestinal system: Abdomen is nondistended, soft and nontender. Normal bowel sounds heard. Extremities: No cyanosis, clubbing, edema  Central nervous system: Alert and oriented. No focal neurological deficits. Moving extremities Skin: No rashes, lesions or ulcers Psychiatry: Flat affect.  Not agitated.   Data Reviewed: I have personally reviewed following labs and imaging studies  CBC: Recent Labs  Lab 04/11/24 1606 04/12/24 0543  WBC 6.4 7.1  HGB 13.1 13.4  HCT 38.1 38.2  MCV 85.6 85.1  PLT 322 321   Basic Metabolic Panel: Recent Labs  Lab 04/08/24 1324 04/11/24 1606 04/12/24 0543  NA 133* 122* 122*  K 3.9 3.6 3.6  CL 95* 86* 86*  CO2 31 25 25   GLUCOSE 95 102* 107*  BUN 9 10 6   CREATININE 0.71 0.67 0.59  CALCIUM 9.5 9.0 9.0   GFR: Estimated Creatinine Clearance: 94.5 mL/min (by C-G formula based on SCr of 0.59 mg/dL). Liver Function Tests: No results for  input(s): AST, ALT, ALKPHOS, BILITOT, PROT, ALBUMIN in the last 168 hours. Recent Labs  Lab 04/11/24 1606  LIPASE 21   No results for input(s): AMMONIA in the last 168 hours. Coagulation Profile: No results for input(s): INR, PROTIME in the last 168 hours. Cardiac Enzymes: No results for input(s): CKTOTAL, CKMB, CKMBINDEX, TROPONINI in the last 168 hours. BNP (last 3 results) No results for input(s): PROBNP in the last 8760 hours. HbA1C: No results for input(s): HGBA1C in the last 72 hours. CBG: No  results for input(s): GLUCAP in the last 168 hours. Lipid Profile: No results for input(s): CHOL, HDL, LDLCALC, TRIG, CHOLHDL, LDLDIRECT in the last 72 hours. Thyroid  Function Tests: Recent Labs    04/12/24 0543  TSH 1.610   Anemia Panel: No results for input(s): VITAMINB12, FOLATE, FERRITIN, TIBC, IRON, RETICCTPCT in the last 72 hours. Sepsis Labs: No results for input(s): PROCALCITON, LATICACIDVEN in the last 168 hours.  No results found for this or any previous visit (from the past 240 hours).       Radiology Studies: DG Chest 2 View Result Date: 04/11/2024 EXAM: 2 VIEW(S) XRAY OF THE CHEST 04/11/2024 04:29:00 PM COMPARISON: Comparison with 03/28/2013. CLINICAL HISTORY: shoulder blade pain Nausea. FINDINGS: LUNGS AND PLEURA: No focal pulmonary opacity. No pleural effusion. No pneumothorax. HEART AND MEDIASTINUM: No acute abnormality of the cardiac and mediastinal silhouettes. BONES AND SOFT TISSUES: No acute osseous abnormality. IMPRESSION: 1. No acute cardiopulmonary process. Electronically signed by: Elsie Gravely MD 04/11/2024 05:14 PM EST RP Workstation: [id number]        Scheduled Meds:  enoxaparin  (LOVENOX ) injection  40 mg Subcutaneous Q24H   incobotulinumtoxinA   300 Units Intramuscular Q90 days   methocarbamol   500 mg Oral TID   sodium chloride   1 g Oral TID WC   Continuous Infusions:  0.9 % NaCl with KCl 20 mEq / L 75 mL/hr at 04/11/24 2325          Sophie Mao, MD [facility] 04/12/2024, 11:13 AM

## 2024-04-13 ENCOUNTER — Other Ambulatory Visit (HOSPITAL_COMMUNITY): Payer: Self-pay

## 2024-04-13 DIAGNOSIS — E871 Hypo-osmolality and hyponatremia: Secondary | ICD-10-CM | POA: Diagnosis not present

## 2024-04-13 LAB — COMPREHENSIVE METABOLIC PANEL WITH GFR
ALT: 8 U/L (ref 0–44)
AST: 19 U/L (ref 15–41)
Albumin: 4.4 g/dL (ref 3.5–5.0)
Alkaline Phosphatase: 58 U/L (ref 38–126)
Anion gap: 11 (ref 5–15)
BUN: 7 mg/dL (ref 6–20)
CO2: 23 mmol/L (ref 22–32)
Calcium: 8.9 mg/dL (ref 8.9–10.3)
Chloride: 98 mmol/L (ref 98–111)
Creatinine, Ser: 0.51 mg/dL (ref 0.44–1.00)
GFR, Estimated: >60 mL/min (ref >60)
Glucose, Bld: 102 mg/dL — ABNORMAL HIGH (ref 70–99)
Potassium: 3.8 mmol/L (ref 3.5–5.1)
Sodium: 131 mmol/L — ABNORMAL LOW (ref 135–145)
Total Bilirubin: 0.3 mg/dL (ref 0.0–1.2)
Total Protein: 7.2 g/dL (ref 6.5–8.1)

## 2024-04-13 LAB — MAGNESIUM: Magnesium: 2 mg/dL (ref 1.7–2.4)

## 2024-04-13 LAB — OSMOLALITY, URINE: Osmolality, Ur: 185 mosm/kg — ABNORMAL LOW (ref 300–900)

## 2024-04-13 MED ORDER — ONDANSETRON HCL 4 MG PO TABS
4.0000 mg | ORAL_TABLET | Freq: Four times a day (QID) | ORAL | 0 refills | Status: AC | PRN
  Filled 2024-04-13: qty 20, 5d supply, fill #0

## 2024-04-13 MED ORDER — SODIUM CHLORIDE 1 G PO TABS
1.0000 g | ORAL_TABLET | Freq: Two times a day (BID) | ORAL | 0 refills | Status: DC
  Filled 2024-04-13: qty 60, 30d supply, fill #0

## 2024-04-13 MED ORDER — HYDRALAZINE HCL 20 MG/ML IJ SOLN
5.0000 mg | Freq: Once | INTRAMUSCULAR | Status: DC | PRN
  Filled 2024-04-13: qty 1

## 2024-04-13 NOTE — Progress Notes (Signed)
 Discharge medications delivered to patient at the bedside.

## 2024-04-13 NOTE — Plan of Care (Signed)

## 2024-04-13 NOTE — Plan of Care (Signed)

## 2024-04-13 NOTE — Discharge Summary (Signed)
 Physician Discharge Summary  Dawn Scott FMW:[id number] DOB: December 02, 1970 DOA: 04/11/2024  PCP: Catherine Charlies LABOR, DO  Admit date: 04/11/2024 Discharge date: 04/13/2024  Admitted From: Home Disposition: Home  Recommendations for Outpatient Follow-up:  Follow up with PCP in 1 week with repeat CBC/BMP Follow up in ED if symptoms worsen or new appear   Home Health: No Equipment/Devices: None  Discharge Condition: Stable CODE STATUS: Full Diet recommendation: Heart healthy  Brief/Interim Summary: 53 year old female with history of hypertension, anxiety/depression, chronic hyponatremia on oral salt tablets who ran out of salt tablets few days ago presented with left shoulder pain along with nausea. On presentation, sodium was 122. She was started on IV fluids.  She was also subsequently started on salt tablets.  Sodium has gradually improved and is 131 today.  She will be discharged home on salt tablets: She was supposed to take 1 g twice a day which should be continued.  Outpatient follow-up of BMP.  Cymbalta  has been discontinued.  Outpatient follow-up with PCP.  Discharge patient home today.  Discharge Diagnoses:   Acute on chronic hyponatremia - Sodium 122 on presentation.  Baseline over the past year has been 126-133. - Patient is on oral salt tablets at home but ran out of salt tablets a few days ago.  She was also on hydrochlorothiazide  which was recently stopped as an outpatient by PCP.  She has been off Wellbutrin  for few weeks. - Treated with IV fluids and salt tablets.  Sodium has gradually improved and is 131 today.  She will be discharged home on salt tablets: She was supposed to take 1 g twice a day which should be continued. Cymbalta  has been discontinued. Outpatient follow-up of BMP. Outpatient follow-up with PCP.  Discharge patient home today.   Hypertension - Blood pressure on the higher side.  Continue spironolactone  and losartan .  Outpatient follow-up with PCP.  Left  shoulder pain - Most likely musculoskeletal in nature.  No cardiac history; troponin less than 15 on presentation with unchanged EKG. - Improved.  Outpatient follow-up.   Depression/anxiety -Continue lamictal .  Cymbalta  to remain on hold for now.       Discharge Instructions  Discharge Instructions     Diet general   Complete by: As directed    Increase activity slowly   Complete by: As directed       Allergies as of 04/13/2024       Reactions   Augmentin  [amoxicillin -pot Clavulanate] Other (See Comments)   Diarrhea C.diff positive.        Medication List     STOP taking these medications    amLODipine  5 MG tablet Commonly known as: NORVASC    DULoxetine  30 MG capsule Commonly known as: CYMBALTA    DULoxetine  60 MG capsule Commonly known as: CYMBALTA        TAKE these medications    ALIGN PO Take by mouth.   B-12 PO Take 1 tablet by mouth every morning.   Biotin 1 MG Caps Take by mouth.   estradiol  0.05 MG/24HR patch Commonly known as: VIVELLE -DOT PLACE 1 PATCH (0.05 MG TOTAL) ONTO THE SKIN 2 (TWO) TIMES A WEEK.   folic acid  1 MG tablet Commonly known as: FOLVITE  Take 1 mg by mouth daily.   lamoTRIgine  150 MG tablet Commonly known as: LAMICTAL  Take 1 tablet (150 mg total) by mouth every evening.   losartan  25 MG tablet Commonly known as: COZAAR  Take 0.5 tablets (12.5 mg total) by mouth daily.   multivitamin with minerals  tablet Take 1 tablet by mouth every evening.   ondansetron  4 MG tablet Commonly known as: ZOFRAN  Take 1 tablet (4 mg total) by mouth every 6 (six) hours as needed for nausea.   progesterone  100 MG capsule Commonly known as: PROMETRIUM  TAKE 1 CAPSULE BY MOUTH AT BEDTIME.   sodium chloride  1 g tablet Take 1 tablet (1 g total) by mouth 2 (two) times daily with a meal.   spironolactone  25 MG tablet Commonly known as: ALDACTONE  Take 0.5 tablets (12.5 mg total) by mouth daily.   urea  40 % Crea Commonly known as:  Gordons Urea  Apply 1 Application topically 2 (two) times daily.   Vitamin D3 50 MCG (2000 UT) Tabs Take 4,000 Units by mouth every morning.          Follow-up Information     Kuneff, Renee A, DO. Schedule an appointment as soon as possible for a visit in 1 week(s).   Specialty: Family Medicine Why: with repeat bmp Contact information: [address] [place] [address] [phone]                Allergies[1]  Consultations: None   Procedures/Studies: DG Chest 2 View Result Date: 04/11/2024 EXAM: 2 VIEW(S) XRAY OF THE CHEST 04/11/2024 04:29:00 PM COMPARISON: Comparison with 03/28/2013. CLINICAL HISTORY: shoulder blade pain Nausea. FINDINGS: LUNGS AND PLEURA: No focal pulmonary opacity. No pleural effusion. No pneumothorax. HEART AND MEDIASTINUM: No acute abnormality of the cardiac and mediastinal silhouettes. BONES AND SOFT TISSUES: No acute osseous abnormality. IMPRESSION: 1. No acute cardiopulmonary process. Electronically signed by: Elsie Gravely MD 04/11/2024 05:14 PM EST RP Workstation: [id number]      Subjective: Patient seen and examined at bedside.  Feels better and feels ready for home today.  No chest pain, shortness of breath reported.  Discharge Exam: Vitals:   04/13/24 0112 04/13/24 0534  BP: (!) 170/99 (!) 162/104  Pulse: 78 79  Resp: 16 16  Temp: (!) 97.5 F (36.4 C) 98.4 F (36.9 C)  SpO2: 100% 98%    General: Pt is alert, awake, not in acute distress.  On room air Cardiovascular: rate controlled, S1/S2 + Respiratory: bilateral decreased breath sounds at bases Abdominal: Soft, NT, ND, bowel sounds + Extremities: no edema, no cyanosis    The results of significant diagnostics from this hospitalization (including imaging, microbiology, ancillary and laboratory) are listed below for reference.     Microbiology: No results found for this or any previous visit (from the past 240 hours).   Labs: BNP (last 3 results) No results for  input(s): BNP in the last 8760 hours. Basic Metabolic Panel: Recent Labs  Lab 04/08/24 1324 04/11/24 1606 04/12/24 0543 04/12/24 1202 04/12/24 1734 04/12/24 2045 04/13/24 0405  NA 133* 122* 122* 122* 129* 134* 131*  K 3.9 3.6 3.6  --   --   --  3.8  CL 95* 86* 86*  --   --   --  98  CO2 31 25 25   --   --   --  23  GLUCOSE 95 102* 107*  --   --   --  102*  BUN 9 10 6   --   --   --  7  CREATININE 0.71 0.67 0.59  --   --   --  0.51  CALCIUM 9.5 9.0 9.0  --   --   --  8.9  MG  --   --   --   --   --   --  2.0   Liver Function Tests: Recent Labs  Lab 04/13/24 0405  AST 19  ALT 8  ALKPHOS 58  BILITOT 0.3  PROT 7.2  ALBUMIN 4.4   Recent Labs  Lab 04/11/24 1606  LIPASE 21   No results for input(s): AMMONIA in the last 168 hours. CBC: Recent Labs  Lab 04/11/24 1606 04/12/24 0543  WBC 6.4 7.1  HGB 13.1 13.4  HCT 38.1 38.2  MCV 85.6 85.1  PLT 322 321   Cardiac Enzymes: No results for input(s): CKTOTAL, CKMB, CKMBINDEX, TROPONINI in the last 168 hours. BNP: Invalid input(s): POCBNP CBG: No results for input(s): GLUCAP in the last 168 hours. D-Dimer No results for input(s): DDIMER in the last 72 hours. Hgb A1c No results for input(s): HGBA1C in the last 72 hours. Lipid Profile No results for input(s): CHOL, HDL, LDLCALC, TRIG, CHOLHDL, LDLDIRECT in the last 72 hours. Thyroid  function studies Recent Labs    04/12/24 0543  TSH 1.610   Anemia work up No results for input(s): VITAMINB12, FOLATE, FERRITIN, TIBC, IRON, RETICCTPCT in the last 72 hours. Urinalysis    Component Value Date/Time   COLORURINE YELLOW 04/11/2024 1730   APPEARANCEUR CLOUDY (A) 04/11/2024 1730   LABSPEC 1.012 04/11/2024 1730   PHURINE 8.0 04/11/2024 1730   GLUCOSEU NEGATIVE 04/11/2024 1730   HGBUR NEGATIVE 04/11/2024 1730   BILIRUBINUR NEGATIVE 04/11/2024 1730   BILIRUBINUR neg 11/20/2014 1145   KETONESUR 20 (A) 04/11/2024 1730    PROTEINUR 30 (A) 04/11/2024 1730   UROBILINOGEN 0.2 11/20/2014 1145   UROBILINOGEN 0.2 03/28/2013 1536   NITRITE NEGATIVE 04/11/2024 1730   LEUKOCYTESUR NEGATIVE 04/11/2024 1730   Sepsis Labs Recent Labs  Lab 04/11/24 1606 04/12/24 0543  WBC 6.4 7.1   Microbiology No results found for this or any previous visit (from the past 240 hours).   Time coordinating discharge: 35 minutes  SIGNED:   Sophie Mao, MD  [facility] 04/13/2024, 7:40 AM      [1]  Allergies Allergen Reactions   Augmentin  [Amoxicillin -Pot Clavulanate] Other (See Comments)    Diarrhea C.diff positive.

## 2024-04-14 ENCOUNTER — Encounter: Payer: Self-pay | Admitting: Family Medicine

## 2024-04-14 ENCOUNTER — Telehealth: Payer: Self-pay

## 2024-04-14 NOTE — Telephone Encounter (Signed)
 Please have patient scheduled an appointment for Friday, 04/15/2024 for evaluation of her blood pressure.  Her reported BPs are significantly high for her, more when I see her before next week in the event we need to change management.

## 2024-04-14 NOTE — Telephone Encounter (Signed)
 Pt scheduled

## 2024-04-14 NOTE — Transitions of Care (Post Inpatient/ED Visit) (Signed)
 04/14/2024  Name: Dawn Scott MRN: [id number] DOB: [birth year]  Today's TOC FU Call Status: Today's TOC FU Call Status:: Successful TOC FU Call Completed TOC FU Call Complete Date: 04/14/24  Patient's Name and Date of Birth confirmed. Name, DOB  Transition Care Management Follow-up Telephone Call Date of Discharge: 04/13/24 Discharge Facility: [facility]) Type of Discharge: Inpatient Admission Primary Inpatient Discharge Diagnosis:: hypo-osmolaity How have you been since you were released from the hospital?: Better Any questions or concerns?: No  Items Reviewed: Did you receive and understand the discharge instructions provided?: Yes Medications obtained,verified, and reconciled?: Yes (Medications Reviewed) Any new allergies since your discharge?: No Dietary orders reviewed?: Yes Do you have support at home?: Yes People in Home [RPT]: child(ren), adult  Medications Reviewed Today: Medications Reviewed Today     Reviewed by Emmitt Pan, LPN (Licensed Practical Nurse) on 04/14/24 at 1221  Med List Status: <None>   Medication Order Taking? Sig Documenting Provider Last Dose Status Informant  Biotin 1 MG CAPS [id number] Yes Take by mouth. [provider]  Active Self, Pharmacy Records  Cholecalciferol (VITAMIN D3) 50 MCG (2000 UT) TABS [id number] Yes Take 4,000 Units by mouth every morning. Rhys Verneita ONEIDA DEVONNA  Active Self, Pharmacy Records  Cyanocobalamin  (B-12 PO) [id number] Yes Take 1 tablet by mouth every morning.  [provider]  Active Self, Pharmacy Records  estradiol  (VIVELLE -DOT) 0.05 MG/24HR patch [id number] Yes PLACE 1 PATCH (0.05 MG TOTAL) ONTO THE SKIN 2 (TWO) TIMES A WEEK. Amundson C Silva, Brook E, MD  Active Self, Pharmacy Records  folic acid  (FOLVITE ) 1 MG tablet [id number] Yes Take 1 mg by mouth daily. [provider]  Active Self, Pharmacy Records  incobotulinumtoxinA  [facility]) 100 units injection 300 Units [id number]   Tat,  Asberry RAMAN, DO  Active   lamoTRIgine  (LAMICTAL ) 150 MG tablet [id number] Yes Take 1 tablet (150 mg total) by mouth every evening. Rhys Verneita ONEIDA, PA-C  Active Self, Pharmacy Records  losartan  (COZAAR ) 25 MG tablet [id number] Yes Take 0.5 tablets (12.5 mg total) by mouth daily. Catherine Charlies LABOR, DO  Active Self, Pharmacy Records  Multiple Vitamins-Minerals (MULTIVITAMIN WITH MINERALS) tablet [id number] Yes Take 1 tablet by mouth every evening.  [provider]  Active Self, Pharmacy Records  ondansetron  (ZOFRAN ) 4 MG tablet [id number] Yes Take 1 tablet (4 mg total) by mouth every 6 (six) hours as needed for nausea. Cheryle Page, MD  Active   Probiotic Product (ALIGN PO) [id number] Yes Take by mouth. [provider]  Active Self, Pharmacy Records  progesterone  (PROMETRIUM ) 100 MG capsule [id number] Yes TAKE 1 CAPSULE BY MOUTH AT BEDTIME. Chrzanowski, Jami B, NP  Active Self, Pharmacy Records  sodium chloride  1 g tablet [id number] Yes Take 1 tablet (1 g total) by mouth 2 (two) times daily with a meal. Cheryle, Kshitiz, MD  Active   spironolactone  (ALDACTONE ) 25 MG tablet [id number] Yes Take 0.5 tablets (12.5 mg total) by mouth daily. Catherine Charlies A, DO  Active Self, Pharmacy Records  urea  (GORDONS UREA ) 40 % CREA [id number] Yes Apply 1 Application topically 2 (two) times daily. Gershon Donnice SAUNDERS, DPM  Active Self, Pharmacy Records            Home Care and Equipment/Supplies: Were Home Health Services Ordered?: NA Any new equipment or medical supplies ordered?: NA  Functional Questionnaire: Do you need assistance with bathing/showering or dressing?: No Do you need assistance with meal preparation?: No Do you need assistance with eating?: No Do  you have difficulty maintaining continence: No Do you need assistance with getting out of bed/getting out of a chair/moving?: No Do you have difficulty managing or taking your medications?: No  Follow up appointments reviewed: PCP Follow-up  appointment confirmed?: Yes Date of PCP follow-up appointment?: 04/15/24 Follow-up Provider: [facility] Follow-up appointment confirmed?: NA Do you need transportation to your follow-up appointment?: No Do you understand care options if your condition(s) worsen?: Yes-patient verbalized understanding    SIGNATURE Julian Lemmings, LPN [facility] Nurse Health Advisor Direct Dial [phone]

## 2024-04-15 ENCOUNTER — Ambulatory Visit: Admitting: Family Medicine

## 2024-04-15 VITALS — BP 160/100 | HR 86 | Temp 98.0°F | Ht 69.0 in | Wt 182.6 lb

## 2024-04-15 DIAGNOSIS — R1012 Left upper quadrant pain: Secondary | ICD-10-CM

## 2024-04-15 DIAGNOSIS — M546 Pain in thoracic spine: Secondary | ICD-10-CM

## 2024-04-15 DIAGNOSIS — E871 Hypo-osmolality and hyponatremia: Secondary | ICD-10-CM

## 2024-04-15 DIAGNOSIS — E663 Overweight: Secondary | ICD-10-CM

## 2024-04-15 DIAGNOSIS — I10 Essential (primary) hypertension: Secondary | ICD-10-CM

## 2024-04-15 LAB — CBC WITH DIFFERENTIAL/PLATELET
Basophils Absolute: 0.1 K/uL (ref 0.0–0.1)
Basophils Relative: 1.1 % (ref 0.0–3.0)
Eosinophils Absolute: 0.1 K/uL (ref 0.0–0.7)
Eosinophils Relative: 1.5 % (ref 0.0–5.0)
HCT: 42.3 % (ref 36.0–46.0)
Hemoglobin: 14.4 g/dL (ref 12.0–15.0)
Lymphocytes Relative: 26.5 % (ref 12.0–46.0)
Lymphs Abs: 1.6 K/uL (ref 0.7–4.0)
MCHC: 34.1 g/dL (ref 30.0–36.0)
MCV: 87.2 fl (ref 78.0–100.0)
Monocytes Absolute: 0.6 K/uL (ref 0.1–1.0)
Monocytes Relative: 9.7 % (ref 3.0–12.0)
Neutro Abs: 3.7 K/uL (ref 1.4–7.7)
Neutrophils Relative %: 61.2 % (ref 43.0–77.0)
Platelets: 354 K/uL (ref 150.0–400.0)
RBC: 4.84 Mil/uL (ref 3.87–5.11)
RDW: 13.1 % (ref 11.5–15.5)
WBC: 6 K/uL (ref 4.0–10.5)

## 2024-04-15 MED ORDER — CYCLOBENZAPRINE HCL 10 MG PO TABS: 10.0000 mg | ORAL_TABLET | Freq: Every day | ORAL | 0 refills | Status: AC

## 2024-04-15 MED ORDER — SODIUM CHLORIDE 1 G PO TABS: 1.0000 g | ORAL_TABLET | Freq: Two times a day (BID) | ORAL | 1 refills | Status: AC

## 2024-04-15 NOTE — Progress Notes (Unsigned)
 "     Dawn Scott , 1970/12/05, 53 y.o., female MRN: [id number] Patient Care Team    Relationship Specialty Notifications Start End  Catherine Charlies LABOR, DO PCP - General Family Medicine  06/28/15   Teressa Toribio SHAUNNA, MD (Inactive) Attending Physician Gastroenterology  08/21/21   Micah Ludwig, LCSW Social Worker Psychology  08/21/21   Elnor Longs, PA-C  Dermatology  10/27/23     Chief Complaint  Patient presents with   Hypertension     Subjective:  Dawn Scott  is a 53 y.o. female presents for hospital follow up after recent admission on 04/11/2024 for primary diagnosis hyponatremia. Drena was discharged on 04/13/2024 to home. Patients discharge summary has been reviewed, as well as all labs/image studies obtained during hospitalization.  Medication reconciliation completed today.  Patients hospital course: Patient presented to the ED with complaints of left shoulder pain and nausea.  During initial evaluation sodium was found to be 122 and she was started on IV fluids and restarted on her salt tabs. Since hospital discharge patient reports her blood pressure has been significantly high.  3 days prior to hospitalization patient's blood pressure was well-controlled on losartan  12.5 mg and spironolactone  12.5 mg.  Her sodium level also was 133.  However she had run out of her salt tabs and was waiting on refill to be delivered. Cymbalta  was discontinued in her hospitalization.  Wellbutrin  was continued 4-5 weeks ago. Patient reports overall she feels okay, but she is still having pain along left mid thoracic, left upper quadrant abdomen.  Nausea has subsided.  Recent Labs  Lab 04/11/24 1606 04/12/24 0543 04/15/24 0954  HGB 13.1 13.4 14.4  HCT 38.1 38.2 42.3  WBC 6.4 7.1 6.0  PLT 322 321 354.0      Latest Ref Rng & Units 04/13/2024    4:05 AM 04/12/2024    8:45 PM 04/12/2024    5:34 PM  CMP  Glucose 70 - 99 mg/dL 897     BUN 6 - 20 mg/dL 7     Creatinine 9.55 - 1.00 mg/dL 9.48      Sodium 864 - 145 mmol/L 131  134  129   Potassium 3.5 - 5.1 mmol/L 3.8     Chloride 98 - 111 mmol/L 98     CO2 22 - 32 mmol/L 23     Calcium 8.9 - 10.3 mg/dL 8.9     Total Protein 6.5 - 8.1 g/dL 7.2     Total Bilirubin 0.0 - 1.2 mg/dL 0.3     Alkaline Phos 38 - 126 U/L 58     AST 15 - 41 U/L 19     ALT 0 - 44 U/L 8         DG Chest 2 View Result Date: 04/11/2024 EXAM: 2 VIEW(S) XRAY OF THE CHEST 04/11/2024 04:29:00 PM COMPARISON: Comparison with 03/28/2013. CLINICAL HISTORY: shoulder blade pain Nausea. FINDINGS: LUNGS AND PLEURA: No focal pulmonary opacity. No pleural effusion. No pneumothorax. HEART AND MEDIASTINUM: No acute abnormality of the cardiac and mediastinal silhouettes. BONES AND SOFT TISSUES: No acute osseous abnormality. IMPRESSION: 1. No acute cardiopulmonary process. Electronically signed by: Elsie Gravely MD 04/11/2024 05:14 PM EST RP Workstation: [id number]        04/15/2024    9:28 AM 04/08/2024    1:21 PM 01/21/2024    9:13 AM 10/27/2023    7:41 AM 06/16/2023    8:03 AM  Depression screen PHQ 2/9  Decreased Interest  0 2  1 0  Down, Depressed, Hopeless 1 0 2 0 0  PHQ - 2 Score 1 0 4 1 0  Altered sleeping 0 0 2 1   Tired, decreased energy 1 1 2 2    Change in appetite  1 0 0   Feeling bad or failure about yourself  0 0 0 0   Trouble concentrating 0 1 3 2    Moving slowly or fidgety/restless 0 0 0 0   Suicidal thoughts 0 0 0 0   PHQ-9 Score 2 3 11  6     Difficult doing work/chores Not difficult at all Not difficult at all Somewhat difficult Not difficult at all      Data saved with a previous flowsheet row definition    Allergies[1] Social History   Tobacco Use   Smoking status: Never   Smokeless tobacco: Never  Substance Use Topics   Alcohol use: Yes    Comment: on special occasions   Past Medical History:  Diagnosis Date   Allergy    Anxiety    Bloody stool 07/07/2016   C. difficile colitis 08/03/2018   Clostridioides difficile infection     Depression    Endometriosis 06/28/2015   Fall 10/16/2023   [place] the [place]- [place] and hit her head, wrist and knee. Was seen at ED and had CT> normal.   GAD (generalized anxiety disorder) 04/01/2018   Hemangioma of skin and subcutaneous tissue 08/21/2021   History of ectopic pregnancy    04/ 2002  S/P  LEFT SALPINGECTOMY   History of endometriosis    History of neoplasm 08/21/2021   History of ovarian cyst    Hypertension    Left ovarian cyst    LGSIL on Pap smear of cervix 08/20/2020   Menorrhagia with regular cycle    PONV (postoperative nausea and vomiting)    Shingles    Uterine fibroid    Past Surgical History:  Procedure Laterality Date   APPENDECTOMY     DILATATION & CURETTAGE/HYSTEROSCOPY WITH MYOSURE N/A 10/30/2016   Procedure: , REMOVAL IUD, HYSTEROSCOPY, DILATION AND CURETTAGE;  Surgeon: Lavoie, Marie-Lyne, MD;  Location: [facility] [address] [place];  Service: Gynecology;  Laterality: N/A;   DILATION AND EVACUATION  03/10/2003   dr lavoie   retained placental tissue postpartum   DX LAPAROSCOPY/ LYSIS ADHESIONAS/  FULGERATION ENDOMETRIOSIS/  LEFT SALPINGECTOMY WITH REMOVAL ECOTPIC  08/18/2000   dr gorge   HYSTEROSCOPY WITH NOVASURE N/A 10/30/2016   Procedure: HYSTEROSCOPY WITH NOVASURE;  Surgeon: Lavoie, Marie-Lyne, MD;  Location: [facility] [place];  Service: Gynecology;  Laterality: N/A;   LAPAROSCOPIC APPENDECTOMY  11/30/2007   SEPTOPLASTY  2011   WISDOM TOOTH EXTRACTION  1996   Family History  Problem Relation Age of Onset   Depression Mother    Arthritis Mother    Heart disease Mother    Transient ischemic attack Mother    Scoliosis Mother    Kyphosis Mother    Seizures Mother        Mini seizures at 63; took off wellbutrin    Alcohol abuse Father    Depression Father    Diabetes Father    Cancer Maternal Aunt        brain cancer   Early death Maternal Aunt        cancer   Cancer Maternal Aunt        ovarian cancer   Depression Paternal Uncle     Transient ischemic attack Maternal Grandmother    Alcohol abuse Maternal Grandfather  Leukemia Maternal Grandfather    Early death Maternal Grandfather        leukemia   Arthritis Paternal Grandmother    Dementia Paternal Grandmother    Alcohol abuse Paternal Grandfather    Early death Paternal Grandfather    Colon cancer Neg Hx    Colon polyps Neg Hx    Esophageal cancer Neg Hx    Rectal cancer Neg Hx    Stomach cancer Neg Hx    Allergies as of 04/15/2024       Reactions   Augmentin  [amoxicillin -pot Clavulanate] Other (See Comments)   Diarrhea C.diff positive.        Medication List        Accurate as of April 15, 2024  4:15 PM. If you have any questions, ask your nurse or doctor.          ALIGN PO Take by mouth.   B-12 PO Take 1 tablet by mouth every morning.   Biotin 1 MG Caps Take by mouth.   cyclobenzaprine  10 MG tablet Commonly known as: FLEXERIL  Take 1 tablet (10 mg total) by mouth at bedtime. Started by: Charlies Bellini, DO   estradiol  0.05 MG/24HR patch Commonly known as: VIVELLE -DOT PLACE 1 PATCH (0.05 MG TOTAL) ONTO THE SKIN 2 (TWO) TIMES A WEEK.   folic acid  1 MG tablet Commonly known as: FOLVITE  Take 1 mg by mouth daily.   lamoTRIgine  150 MG tablet Commonly known as: LAMICTAL  Take 1 tablet (150 mg total) by mouth every evening.   losartan  25 MG tablet Commonly known as: COZAAR  Take 0.5 tablets (12.5 mg total) by mouth daily.   multivitamin with minerals tablet Take 1 tablet by mouth every evening.   ondansetron  4 MG tablet Commonly known as: ZOFRAN  Take 1 tablet (4 mg total) by mouth every 6 (six) hours as needed for nausea.   progesterone  100 MG capsule Commonly known as: PROMETRIUM  TAKE 1 CAPSULE BY MOUTH AT BEDTIME.   sodium chloride  1 g tablet Take 1 tablet (1 g total) by mouth 2 (two) times daily with a meal.   spironolactone  25 MG tablet Commonly known as: ALDACTONE  Take 0.5 tablets (12.5 mg total) by mouth daily.    urea  40 % Crea Commonly known as: Gordons Urea  Apply 1 Application topically 2 (two) times daily.   Vitamin D3 50 MCG (2000 UT) Tabs Take 4,000 Units by mouth every morning.        All past medical history, surgical history, allergies, family history, immunizations and medications were updated in the EMR today and reviewed under the history and medication portions of their EMR.      ROS: Negative, with the exception of above mentioned in HPI   Objective:  BP (!) 160/100   Pulse 86   Temp 98 F (36.7 C)   Ht 5' 9 (1.753 m)   Wt 182 lb 9.6 oz (82.8 kg)   LMP 06/05/2022 Comment: Very light  SpO2 98%   BMI 26.97 kg/m  Body mass index is 26.97 kg/m. Physical Exam Vitals and nursing note reviewed.  Constitutional:      General: She is not in acute distress.    Appearance: Normal appearance. She is not ill-appearing, toxic-appearing or diaphoretic.  HENT:     Head: Normocephalic and atraumatic.  Eyes:     General: No scleral icterus.       Right eye: No discharge.        Left eye: No discharge.     Extraocular Movements:  Extraocular movements intact.     Conjunctiva/sclera: Conjunctivae normal.     Pupils: Pupils are equal, round, and reactive to light.  Cardiovascular:     Rate and Rhythm: Normal rate and regular rhythm.     Heart sounds: No murmur heard. Pulmonary:     Effort: Pulmonary effort is normal. No respiratory distress.     Breath sounds: Normal breath sounds. No wheezing, rhonchi or rales.  Abdominal:     General: Abdomen is flat. Bowel sounds are normal. There is no distension.     Palpations: Abdomen is soft. There is no mass.     Tenderness: There is no abdominal tenderness. There is no right CVA tenderness, left CVA tenderness, guarding or rebound.  Musculoskeletal:        General: Tenderness present.     Right lower leg: No edema.     Left lower leg: No edema.  Skin:    General: Skin is warm.     Findings: No rash.  Neurological:     Mental  Status: She is alert and oriented to person, place, and time. Mental status is at baseline.     Motor: No weakness.     Gait: Gait normal.  Psychiatric:        Mood and Affect: Mood normal.        Behavior: Behavior normal.        Thought Content: Thought content normal.        Judgment: Judgment normal.      Assessment/Plan: Dawn Scott is a 53 y.o. female present for OV for Hospital discharge follow up Essential hypertension (Primary)/E66.3 (BMI 25.0-29.9) Start losartan  25 mg (increased from 4.5) and spironolactone  12.5 mg daily.  If blood pressure still remain greater than 140/90 with the above regimen increase spironolactone  to 25 mg daily. - CBC w/Diff - sodium chloride  1 g tablet; Take 1 tablet (1 g total) by mouth 2 (two) times daily with a meal.  Dispense: 180 tablet; Refill: 1 - Basic Metabolic Panel (BMET) - Osmolality - Osmolality, urine - Sodium, urine, random - CT ABDOMEN PELVIS W CONTRAST; Future  Hyponatremia Continue salt tabs twice daily. All hydration efforts with electrolyte replacement, avoiding plain water. - CBC w/Diff - sodium chloride  1 g tablet; Take 1 tablet (1 g total) by mouth 2 (two) times daily with a meal.  Dispense: 180 tablet; Refill: 1 - Basic Metabolic Panel (BMET) - Osmolality - Osmolality, urine - Sodium, urine, random Consider endocrine referral for further evaluation  Left upper quadrant abdominal pain/Acute left-sided thoracic back pain - CT ABDOMEN PELVIS W CONTRAST; Future -Concern patient still having sharp pains in her left upper quadrant/thoracic area.  She presented to the ED for pain that was radiating to her left shoulder.  Her blood pressures are now well-controlled since hospital discharge. Recommend moving forward with CT abdomen to better evaluate thoracic/left upper quadrant pain. Cannot rule out diaphragmatic irritation as the cause of left shoulder pain> CT abdomen ordered.  Follow-up in 4 days Reviewed expectations  re: course of current medical issues. Discussed self-management of symptoms. Outlined signs and symptoms indicating need for more acute intervention. Patient verbalized understanding and all questions were answered. Patient received an After-Visit Summary. Any changes in medications were reviewed and patient was provided with updated med list with their AVS.     Orders Placed This Encounter  Procedures   Basic Metabolic Panel (BMET)   CBC w/Diff   Osmolality   Osmolality, urine   Sodium,  urine, random     Note is dictated utilizing voice recognition software. Although note has been proof read prior to signing, occasional typographical errors still can be missed. If any questions arise, please do not hesitate to call for verification.   electronically signed by:  Charlies Bellini, DO  [facility] [place]       [1]  Allergies Allergen Reactions   Augmentin  [Amoxicillin -Pot Clavulanate] Other (See Comments)    Diarrhea C.diff positive.   "

## 2024-04-16 LAB — BASIC METABOLIC PANEL WITH GFR
BUN: 11 mg/dL (ref 7–25)
CO2: 29 mmol/L (ref 20–32)
Calcium: 10.2 mg/dL (ref 8.6–10.4)
Chloride: 87 mmol/L — ABNORMAL LOW (ref 98–110)
Creat: 0.72 mg/dL (ref 0.50–1.03)
Glucose, Bld: 84 mg/dL (ref 65–99)
Potassium: 4.1 mmol/L (ref 3.5–5.3)
Sodium: 126 mmol/L — ABNORMAL LOW (ref 135–146)
eGFR: 101 mL/min/1.73m2

## 2024-04-16 LAB — OSMOLALITY
Osmolality: 270 mosm/kg — ABNORMAL LOW (ref 278–305)
Osmolality: 271 mosm/kg — ABNORMAL LOW (ref 278–305)

## 2024-04-18 ENCOUNTER — Ambulatory Visit: Payer: Self-pay

## 2024-04-18 ENCOUNTER — Encounter: Payer: Self-pay | Admitting: Family Medicine

## 2024-04-18 ENCOUNTER — Inpatient Hospital Stay: Admitting: Family Medicine

## 2024-04-18 VITALS — BP 140/86 | HR 84 | Temp 97.9°F | Wt 188.0 lb

## 2024-04-18 DIAGNOSIS — E871 Hypo-osmolality and hyponatremia: Secondary | ICD-10-CM | POA: Diagnosis not present

## 2024-04-18 DIAGNOSIS — I10 Essential (primary) hypertension: Secondary | ICD-10-CM | POA: Diagnosis not present

## 2024-04-18 LAB — BASIC METABOLIC PANEL WITH GFR
BUN: 13 mg/dL (ref 6–23)
CO2: 31 meq/L (ref 19–32)
Calcium: 9.4 mg/dL (ref 8.4–10.5)
Chloride: 88 meq/L — ABNORMAL LOW (ref 96–112)
Creatinine, Ser: 0.66 mg/dL (ref 0.40–1.20)
GFR: 100.95 mL/min
Glucose, Bld: 77 mg/dL (ref 70–99)
Potassium: 4.1 meq/L (ref 3.5–5.1)
Sodium: 127 meq/L — ABNORMAL LOW (ref 135–145)

## 2024-04-18 MED ORDER — SPIRONOLACTONE 25 MG PO TABS: 25.0000 mg | ORAL_TABLET | Freq: Every day | ORAL | 1 refills | Status: AC

## 2024-04-18 MED ORDER — HYDRALAZINE HCL 10 MG PO TABS: 10.0000 mg | ORAL_TABLET | Freq: Three times a day (TID) | ORAL | 0 refills | Status: DC

## 2024-04-18 MED ORDER — LOSARTAN POTASSIUM 50 MG PO TABS: 75.0000 mg | ORAL_TABLET | Freq: Every day | ORAL | 1 refills | Status: DC

## 2024-04-18 NOTE — Progress Notes (Signed)
 "     Dawn Scott , 07-15-1970, 53 y.o., female MRN: [id number] Patient Care Team    Relationship Specialty Notifications Start End  Catherine Charlies LABOR, DO PCP - General Family Medicine  06/28/15   Teressa Toribio SHAUNNA, MD (Inactive) Attending Physician Gastroenterology  08/21/21   Micah Ludwig, LCSW Social Worker Psychology  08/21/21   Elnor Longs, PA-C  Dermatology  10/27/23     Chief Complaint  Patient presents with   Hypertension    Pt has at home readings with her today.      Subjective:  Dawn Scott  is a 53 y.o. female presents for hyponatremia/hypertension follow-up after   Patient reports she has been taking losartan  50 mg and spironolactone  25 mg daily since office visit 4 days ago. Blood pressures are again are reported as 139-171/96 -117. She reports overall she is feeling better but she is still having pain left upper quadrant/thoracic location. Over the weekend she make sure to drink tomato juice, she was eating AND taking electrolyte replacement drinks.  We tried Flexeril  to help with the discomfort.  She states it is improved but still present.  Still worse when laying flat with movement. Unfortunately urine sodium levels are still pending from Friday.  Recent Labs  Lab 04/11/24 1606 04/12/24 0543 04/15/24 0954  HGB 13.1 13.4 14.4  HCT 38.1 38.2 42.3  WBC 6.4 7.1 6.0  PLT 322 321 354.0      Latest Ref Rng & Units 04/15/2024    4:54 PM 04/13/2024    4:05 AM 04/12/2024    8:45 PM  CMP  Glucose 65 - 99 mg/dL 84  897    BUN 7 - 25 mg/dL 11  7    Creatinine 9.49 - 1.03 mg/dL 9.27  9.48    Sodium 864 - 146 mmol/L 126  131  134   Potassium 3.5 - 5.3 mmol/L 4.1  3.8    Chloride 98 - 110 mmol/L 87  98    CO2 20 - 32 mmol/L 29  23    Calcium 8.6 - 10.4 mg/dL 89.7  8.9    Total Protein 6.5 - 8.1 g/dL  7.2    Total Bilirubin 0.0 - 1.2 mg/dL  0.3    Alkaline Phos 38 - 126 U/L  58    AST 15 - 41 U/L  19    ALT 0 - 44 U/L  8          04/15/2024    9:28 AM  04/08/2024    1:21 PM 01/21/2024    9:13 AM 10/27/2023    7:41 AM 06/16/2023    8:03 AM  Depression screen PHQ 2/9  Decreased Interest  0 2 1 0  Down, Depressed, Hopeless 1 0 2 0 0  PHQ - 2 Score 1 0 4 1 0  Altered sleeping 0 0 2 1   Tired, decreased energy 1 1 2 2    Change in appetite  1 0 0   Feeling bad or failure about yourself  0 0 0 0   Trouble concentrating 0 1 3 2    Moving slowly or fidgety/restless 0 0 0 0   Suicidal thoughts 0 0 0 0   PHQ-9 Score 2 3 11  6     Difficult doing work/chores Not difficult at all Not difficult at all Somewhat difficult Not difficult at all      Data saved with a previous flowsheet row definition    Allergies[1] Social History  Tobacco Use   Smoking status: Never   Smokeless tobacco: Never  Substance Use Topics   Alcohol use: Yes    Comment: on special occasions   Past Medical History:  Diagnosis Date   Allergy    Anxiety    Bloody stool 07/07/2016   C. difficile colitis 08/03/2018   Clostridioides difficile infection    Depression    Endometriosis 06/28/2015   Fall 10/16/2023   [place] the [place]- [place] and hit her head, wrist and knee. Was seen at ED and had CT> normal.   GAD (generalized anxiety disorder) 04/01/2018   Hemangioma of skin and subcutaneous tissue 08/21/2021   History of ectopic pregnancy    04/ 2002  S/P  LEFT SALPINGECTOMY   History of endometriosis    History of neoplasm 08/21/2021   History of ovarian cyst    Hypertension    Left ovarian cyst    LGSIL on Pap smear of cervix 08/20/2020   Menorrhagia with regular cycle    PONV (postoperative nausea and vomiting)    Shingles    Uterine fibroid    Past Surgical History:  Procedure Laterality Date   APPENDECTOMY     DILATATION & CURETTAGE/HYSTEROSCOPY WITH MYOSURE N/A 10/30/2016   Procedure: , REMOVAL IUD, HYSTEROSCOPY, DILATION AND CURETTAGE;  Surgeon: Lavoie, Marie-Lyne, MD;  Location: [facility] [place];  Service: Gynecology;  Laterality: N/A;    DILATION AND EVACUATION  03/10/2003   dr lavoie   retained placental tissue postpartum   DX LAPAROSCOPY/ LYSIS ADHESIONAS/  FULGERATION ENDOMETRIOSIS/  LEFT SALPINGECTOMY WITH REMOVAL ECOTPIC  08/18/2000   dr gorge   HYSTEROSCOPY WITH NOVASURE N/A 10/30/2016   Procedure: HYSTEROSCOPY WITH NOVASURE;  Surgeon: Lavoie, Marie-Lyne, MD;  Location: [facility];  Service: Gynecology;  Laterality: N/A;   LAPAROSCOPIC APPENDECTOMY  11/30/2007   SEPTOPLASTY  2011   WISDOM TOOTH EXTRACTION  1996   Family History  Problem Relation Age of Onset   Depression Mother    Arthritis Mother    Heart disease Mother    Transient ischemic attack Mother    Scoliosis Mother    Kyphosis Mother    Seizures Mother        Mini seizures at 60; took off wellbutrin    Alcohol abuse Father    Depression Father    Diabetes Father    Cancer Maternal Aunt        brain cancer   Early death Maternal Aunt        cancer   Cancer Maternal Aunt        ovarian cancer   Depression Paternal Uncle    Transient ischemic attack Maternal Grandmother    Alcohol abuse Maternal Grandfather    Leukemia Maternal Grandfather    Early death Maternal Grandfather        leukemia   Arthritis Paternal Grandmother    Dementia Paternal Grandmother    Alcohol abuse Paternal Grandfather    Early death Paternal Grandfather    Colon cancer Neg Hx    Colon polyps Neg Hx    Esophageal cancer Neg Hx    Rectal cancer Neg Hx    Stomach cancer Neg Hx    Allergies as of 04/18/2024       Reactions   Augmentin  [amoxicillin -pot Clavulanate] Other (See Comments)   Diarrhea C.diff positive.        Medication List        Accurate as of April 18, 2024  3:44 PM. If you have  any questions, ask your nurse or doctor.          ALIGN PO Take by mouth.   B-12 PO Take 1 tablet by mouth every morning.   Biotin 1 MG Caps Take by mouth.   cyclobenzaprine  10 MG tablet Commonly known as: FLEXERIL  Take 1 tablet (10 mg  total) by mouth at bedtime.   estradiol  0.05 MG/24HR patch Commonly known as: VIVELLE -DOT PLACE 1 PATCH (0.05 MG TOTAL) ONTO THE SKIN 2 (TWO) TIMES A WEEK.   folic acid  1 MG tablet Commonly known as: FOLVITE  Take 1 mg by mouth daily.   hydrALAZINE  10 MG tablet Commonly known as: APRESOLINE  Take 1 tablet (10 mg total) by mouth 3 (three) times daily. Started by: Charlies Bellini, DO   lamoTRIgine  150 MG tablet Commonly known as: LAMICTAL  Take 1 tablet (150 mg total) by mouth every evening.   losartan  50 MG tablet Commonly known as: COZAAR  Take 1.5 tablets (75 mg total) by mouth daily. What changed:  medication strength how much to take Changed by: Charlies Bellini, DO   multivitamin with minerals tablet Take 1 tablet by mouth every evening.   ondansetron  4 MG tablet Commonly known as: ZOFRAN  Take 1 tablet (4 mg total) by mouth every 6 (six) hours as needed for nausea.   progesterone  100 MG capsule Commonly known as: PROMETRIUM  TAKE 1 CAPSULE BY MOUTH AT BEDTIME.   sodium chloride  1 g tablet Take 1 tablet (1 g total) by mouth 2 (two) times daily with a meal.   spironolactone  25 MG tablet Commonly known as: ALDACTONE  Take 1 tablet (25 mg total) by mouth daily. What changed: how much to take Changed by: Charlies Bellini, DO   urea  40 % Crea Commonly known as: Gordons Urea  Apply 1 Application topically 2 (two) times daily.   Vitamin D3 50 MCG (2000 UT) Tabs Take 4,000 Units by mouth every morning.        All past medical history, surgical history, allergies, family history, immunizations and medications were updated in the EMR today and reviewed under the history and medication portions of their EMR.      ROS: Negative, with the exception of above mentioned in HPI   Objective:  BP (!) 140/86   Pulse 84   Temp 97.9 F (36.6 C)   Wt 188 lb (85.3 kg)   LMP 06/05/2022 Comment: Very light  SpO2 98%   BMI 27.76 kg/m  Body mass index is 27.76 kg/m. Physical  Exam Vitals and nursing note reviewed.  Constitutional:      General: She is not in acute distress.    Appearance: Normal appearance. She is not ill-appearing, toxic-appearing or diaphoretic.  HENT:     Head: Normocephalic and atraumatic.  Eyes:     General: No scleral icterus.       Right eye: No discharge.        Left eye: No discharge.     Extraocular Movements: Extraocular movements intact.     Conjunctiva/sclera: Conjunctivae normal.     Pupils: Pupils are equal, round, and reactive to light.  Cardiovascular:     Rate and Rhythm: Normal rate and regular rhythm.     Heart sounds: No murmur heard. Pulmonary:     Effort: Pulmonary effort is normal. No respiratory distress.     Breath sounds: Normal breath sounds. No wheezing, rhonchi or rales.  Musculoskeletal:     Right lower leg: No edema.     Left lower leg: No edema.  Skin:    General: Skin is warm.     Findings: No rash.  Neurological:     Mental Status: She is alert and oriented to person, place, and time. Mental status is at baseline.     Motor: No weakness.     Gait: Gait normal.  Psychiatric:        Mood and Affect: Mood normal.        Behavior: Behavior normal.        Thought Content: Thought content normal.        Judgment: Judgment normal.    Assessment/Plan: Dawn Scott is a 53 y.o. female present for OV for Hospital discharge follow up Essential hypertension (Primary) Increase losartan  to 75 mg daily Continue spironolactone  25 mg daily If blood pressures greater than 140/90-hydralazine  10 mg 3 times daily as needed - Basic Metabolic Panel (BMET)  Hyponatremia Consider referral to endocrinology if not seeing improvement Continue salt tabs - Basic Metabolic Panel (BMET) Avoid consuming water alone- should always have electrolyte replacements    Reviewed expectations re: course of current medical issues. Discussed self-management of symptoms. Outlined signs and symptoms indicating need for more  acute intervention. Patient verbalized understanding and all questions were answered. Patient received an After-Visit Summary. Any changes in medications were reviewed and patient was provided with updated med list with their AVS.     Orders Placed This Encounter  Procedures   Basic Metabolic Panel (BMET)     Note is dictated utilizing voice recognition software. Although note has been proof read prior to signing, occasional typographical errors still can be missed. If any questions arise, please do not hesitate to call for verification.   electronically signed by:  Charlies Bellini, DO  [facility] [place]        [1]  Allergies Allergen Reactions   Augmentin  [Amoxicillin -Pot Clavulanate] Other (See Comments)    Diarrhea C.diff positive.   "

## 2024-04-18 NOTE — Patient Instructions (Signed)

## 2024-04-18 NOTE — Patient Instructions (Signed)

## 2024-04-18 NOTE — Telephone Encounter (Signed)
 Pt seen today, 12/22.

## 2024-04-19 ENCOUNTER — Ambulatory Visit: Payer: Self-pay | Admitting: Family Medicine

## 2024-04-19 ENCOUNTER — Encounter: Payer: Self-pay | Admitting: Family Medicine

## 2024-04-19 DIAGNOSIS — E871 Hypo-osmolality and hyponatremia: Secondary | ICD-10-CM

## 2024-04-19 LAB — SODIUM, URINE, RANDOM: Sodium, Ur: 19 mmol/L — ABNORMAL LOW (ref 28–272)

## 2024-04-20 LAB — SODIUM, URINE, RANDOM: Sodium, Ur: 19 mmol/L — ABNORMAL LOW (ref 28–272)

## 2024-04-20 LAB — OSMOLALITY, URINE: Osmolality, Ur: 100 mosm/kg (ref 50–1200)

## 2024-04-24 ENCOUNTER — Other Ambulatory Visit: Payer: Self-pay | Admitting: Family Medicine

## 2024-04-26 ENCOUNTER — Encounter: Admitting: Family Medicine

## 2024-04-30 ENCOUNTER — Encounter: Payer: Self-pay | Admitting: Neurology

## 2024-04-30 ENCOUNTER — Ambulatory Visit (HOSPITAL_BASED_OUTPATIENT_CLINIC_OR_DEPARTMENT_OTHER)
Admission: RE | Admit: 2024-04-30 | Discharge: 2024-04-30 | Disposition: A | Discharge status: Home / Self Care | Source: Ambulatory Visit | Attending: Family Medicine | Admitting: Family Medicine

## 2024-04-30 DIAGNOSIS — R1012 Left upper quadrant pain: Secondary | ICD-10-CM | POA: Diagnosis present

## 2024-04-30 DIAGNOSIS — M546 Pain in thoracic spine: Secondary | ICD-10-CM | POA: Diagnosis present

## 2024-04-30 DIAGNOSIS — I10 Essential (primary) hypertension: Secondary | ICD-10-CM | POA: Insufficient documentation

## 2024-04-30 MED ORDER — IOHEXOL 300 MG/ML  SOLN
100.0000 mL | Freq: Once | INTRAMUSCULAR | Status: AC | PRN
  Administered 2024-04-30: 100 mL via INTRAVENOUS

## 2024-05-03 ENCOUNTER — Ambulatory Visit (INDEPENDENT_AMBULATORY_CARE_PROVIDER_SITE_OTHER): Admitting: Family Medicine

## 2024-05-03 ENCOUNTER — Ambulatory Visit: Payer: Self-pay | Admitting: Family Medicine

## 2024-05-03 ENCOUNTER — Encounter: Payer: Self-pay | Admitting: Family Medicine

## 2024-05-03 VITALS — BP 128/78 | HR 75 | Temp 98.2°F | Wt 186.6 lb

## 2024-05-03 DIAGNOSIS — I159 Secondary hypertension, unspecified: Secondary | ICD-10-CM | POA: Diagnosis not present

## 2024-05-03 DIAGNOSIS — E871 Hypo-osmolality and hyponatremia: Secondary | ICD-10-CM | POA: Diagnosis not present

## 2024-05-03 DIAGNOSIS — I10 Essential (primary) hypertension: Secondary | ICD-10-CM | POA: Diagnosis not present

## 2024-05-03 DIAGNOSIS — E663 Overweight: Secondary | ICD-10-CM | POA: Diagnosis not present

## 2024-05-03 DIAGNOSIS — Z8249 Family history of ischemic heart disease and other diseases of the circulatory system: Secondary | ICD-10-CM

## 2024-05-03 LAB — BASIC METABOLIC PANEL WITH GFR
BUN: 12 mg/dL (ref 6–23)
CO2: 32 meq/L (ref 19–32)
Calcium: 9.4 mg/dL (ref 8.4–10.5)
Chloride: 96 meq/L (ref 96–112)
Creatinine, Ser: 0.75 mg/dL (ref 0.40–1.20)
GFR: 91.59 mL/min
Glucose, Bld: 75 mg/dL (ref 70–99)
Potassium: 4.1 meq/L (ref 3.5–5.1)
Sodium: 132 meq/L — ABNORMAL LOW (ref 135–145)

## 2024-05-03 LAB — CORTISOL: Cortisol, Plasma: 6.6 ug/dL

## 2024-05-03 MED ORDER — LOSARTAN POTASSIUM 100 MG PO TABS: 100.0000 mg | ORAL_TABLET | Freq: Every day | ORAL | 1 refills | Status: AC

## 2024-05-03 NOTE — Progress Notes (Signed)
 "     Dawn Scott , 1970-09-17, 54 y.o., female MRN: [id number] Patient Care Team    Relationship Specialty Notifications Start End  Catherine Charlies LABOR, DO PCP - General Family Medicine  06/28/15   Teressa Toribio SHAUNNA, MD (Inactive) Attending Physician Gastroenterology  08/21/21   Micah Ludwig, LCSW Social Worker Psychology  08/21/21   Elnor Longs, PA-C  Dermatology  10/27/23     Chief Complaint  Patient presents with   Hypertension     Subjective:  Dawn Scott  is a 54 y.o. female presents for hyponatremia/hypertension follow-up  Patient reports she is still having some mild left-sided flank/upper abdomen discomfort.  CT of abdomen was normal.  She states the pain has lessened, but it is still present.  There has been no notable injury and onset of pain was at the time of her sudden increase in blood pressure. Patient reports she has been taking losartan  75 mg and spironolactone  25 mg daily and reports her blood pressures are much improved, but still having some mild elevations and taking the hydralazine  about once daily.   No results for input(s): HGB, HCT, WBC, PLT in the last 168 hours.     Latest Ref Rng & Units 05/03/2024    8:08 AM 04/18/2024    8:40 AM 04/15/2024    4:54 PM  CMP  Glucose 70 - 99 mg/dL 75  77  84   BUN 6 - 23 mg/dL 12  13  11    Creatinine 0.40 - 1.20 mg/dL 9.24  9.33  9.27   Sodium 135 - 145 mEq/L 132  127  126   Potassium 3.5 - 5.1 mEq/L 4.1  4.1  4.1   Chloride 96 - 112 mEq/L 96  88  87   CO2 19 - 32 mEq/L 32  31  29   Calcium 8.4 - 10.5 mg/dL 9.4  9.4  89.7         05/03/2024    7:51 AM 04/15/2024    9:28 AM 04/08/2024    1:21 PM 01/21/2024    9:13 AM 10/27/2023    7:41 AM  Depression screen PHQ 2/9  Decreased Interest 0  0 2 1  Down, Depressed, Hopeless 0 1 0 2 0  PHQ - 2 Score 0 1 0 4 1  Altered sleeping 1 0 0 2 1  Tired, decreased energy 1 1 1 2 2   Change in appetite 1  1 0 0  Feeling bad or failure about yourself  0 0 0 0 0  Trouble  concentrating 1 0 1 3 2   Moving slowly or fidgety/restless 0 0 0 0 0  Suicidal thoughts 0 0 0 0 0  PHQ-9 Score 4 2 3 11  6    Difficult doing work/chores Not difficult at all Not difficult at all Not difficult at all Somewhat difficult Not difficult at all     Data saved with a previous flowsheet row definition    Allergies[1] Social History   Tobacco Use   Smoking status: Never   Smokeless tobacco: Never  Substance Use Topics   Alcohol use: Yes    Comment: on special occasions   Past Medical History:  Diagnosis Date   Allergy    Anxiety    Bloody stool 07/07/2016   C. difficile colitis 08/03/2018   Clostridioides difficile infection    Depression    Endometriosis 06/28/2015   Fall 10[place]16/2023   [place] and hit her head, wrist  and knee. Was seen at ED and had CT> normal.   GAD (generalized anxiety disorder) 04/01/2018   Hemangioma of skin and subcutaneous tissue 08/21/2021   History of ectopic pregnancy    04/ 2002  S/P  LEFT SALPINGECTOMY   History of endometriosis    History of neoplasm 08/21/2021   History of ovarian cyst    Hypertension    Left ovarian cyst    LGSIL on Pap smear of cervix 08/20/2020   Menorrhagia with regular cycle    PONV (postoperative nausea and vomiting)    Shingles    Uterine fibroid    Past Surgical History:  Procedure Laterality Date   APPENDECTOMY     DILATATION & CURETTAGE/HYSTEROSCOPY WITH MYOSURE N/A 10/30/2016   Procedure: , REMOVAL IUD, HYSTEROSCOPY, DILATION AND CURETTAGE;  Surgeon: Lavoie, Marie-Lyne, MD;  Location: [facility];  Service: Gynecology;  Laterality: N/A;   DILATION AND EVACUATION  03/10/2003   dr lavoie   retained placental tissue postpartum   DX LAPAROSCOPY/ LYSIS ADHESIONAS/  FULGERATION ENDOMETRIOSIS/  LEFT SALPINGECTOMY WITH REMOVAL ECOTPIC  08/18/2000   dr gorge   HYSTEROSCOPY WITH NOVASURE N/A 10/30/2016   Procedure: HYSTEROSCOPY WITH NOVASURE;  Surgeon: Lavoie, Marie-Lyne, MD;   Location: [facility];  Service: Gynecology;  Laterality: N/A;   LAPAROSCOPIC APPENDECTOMY  11/30/2007   SEPTOPLASTY  2011   WISDOM TOOTH EXTRACTION  1996   Family History  Problem Relation Age of Onset   Depression Mother    Arthritis Mother    Heart disease Mother    Transient ischemic attack Mother    Scoliosis Mother    Kyphosis Mother    Seizures Mother        Mini seizures at 14; took off wellbutrin    Alcohol abuse Father    Depression Father    Diabetes Father    Cancer Maternal Aunt        brain cancer   Early death Maternal Aunt        cancer   Cancer Maternal Aunt        ovarian cancer   Depression Paternal Uncle    Transient ischemic attack Maternal Grandmother    Alcohol abuse Maternal Grandfather    Leukemia Maternal Grandfather    Early death Maternal Grandfather        leukemia   Arthritis Paternal Grandmother    Dementia Paternal Grandmother    Alcohol abuse Paternal Grandfather    Early death Paternal Grandfather    Colon cancer Neg Hx    Colon polyps Neg Hx    Esophageal cancer Neg Hx    Rectal cancer Neg Hx    Stomach cancer Neg Hx    Allergies as of 05/03/2024       Reactions   Augmentin  [amoxicillin -pot Clavulanate] Other (See Comments)   Diarrhea C.diff positive.        Medication List        Accurate as of May 03, 2024  4:34 PM. If you have any questions, ask your nurse or doctor.          ALIGN PO Take by mouth.   B-12 PO Take 1 tablet by mouth every morning.   Biotin 1 MG Caps Take by mouth.   cyclobenzaprine  10 MG tablet Commonly known as: FLEXERIL  Take 1 tablet (10 mg total) by mouth at bedtime.   estradiol  0.05 MG/24HR patch Commonly known as: VIVELLE -DOT PLACE 1 PATCH (0.05 MG TOTAL) ONTO THE SKIN 2 (TWO) TIMES A  WEEK.   folic acid  1 MG tablet Commonly known as: FOLVITE  Take 1 mg by mouth daily.   hydrALAZINE  10 MG tablet Commonly known as: APRESOLINE  Take 1 tablet (10 mg total) by mouth 3  (three) times daily.   lamoTRIgine  150 MG tablet Commonly known as: LAMICTAL  Take 1 tablet (150 mg total) by mouth every evening.   losartan  100 MG tablet Commonly known as: COZAAR  Take 1 tablet (100 mg total) by mouth daily. What changed:  medication strength how much to take Changed by: Charlies Bellini, DO   multivitamin with minerals tablet Take 1 tablet by mouth every evening.   ondansetron  4 MG tablet Commonly known as: ZOFRAN  Take 1 tablet (4 mg total) by mouth every 6 (six) hours as needed for nausea.   progesterone  100 MG capsule Commonly known as: PROMETRIUM  TAKE 1 CAPSULE BY MOUTH AT BEDTIME.   sodium chloride  1 g tablet Take 1 tablet (1 g total) by mouth 2 (two) times daily with a meal.   spironolactone  25 MG tablet Commonly known as: ALDACTONE  Take 1 tablet (25 mg total) by mouth daily.   urea  40 % Crea Commonly known as: Gordons Urea  Apply 1 Application topically 2 (two) times daily.   Vitamin D3 50 MCG (2000 UT) Tabs Take 4,000 Units by mouth every morning.        All past medical history, surgical history, allergies, family history, immunizations and medications were updated in the EMR today and reviewed under the history and medication portions of their EMR.      ROS: Negative, with the exception of above mentioned in HPI   Objective:  BP 128/78   Pulse 75   Temp 98.2 F (36.8 C)   Wt 186 lb 9.6 oz (84.6 kg)   LMP 06/05/2022 Comment: Very light  SpO2 97%   BMI 27.56 kg/m  Body mass index is 27.56 kg/m. Physical Exam Vitals and nursing note reviewed.  Constitutional:      General: She is not in acute distress.    Appearance: Normal appearance. She is not ill-appearing, toxic-appearing or diaphoretic.  HENT:     Head: Normocephalic and atraumatic.  Eyes:     General: No scleral icterus.       Right eye: No discharge.        Left eye: No discharge.     Extraocular Movements: Extraocular movements intact.     Conjunctiva/sclera:  Conjunctivae normal.     Pupils: Pupils are equal, round, and reactive to light.  Cardiovascular:     Rate and Rhythm: Normal rate and regular rhythm.     Heart sounds: No murmur heard. Pulmonary:     Effort: Pulmonary effort is normal. No respiratory distress.     Breath sounds: Normal breath sounds. No wheezing, rhonchi or rales.  Musculoskeletal:     Right lower leg: No edema.     Left lower leg: No edema.  Skin:    General: Skin is warm.     Findings: No rash.  Neurological:     Mental Status: She is alert and oriented to person, place, and time. Mental status is at baseline.     Motor: No weakness.     Gait: Gait normal.  Psychiatric:        Mood and Affect: Mood normal.        Behavior: Behavior normal.        Thought Content: Thought content normal.        Judgment: Judgment normal.  Assessment/Plan: DAPHANIE OQUENDO is a 54 y.o. female present for OV for Hospital discharge follow up Essential hypertension (Primary) Increase losartan  to 100 mg daily Continue spironolactone  25 mg daily If blood pressures greater than 140/90-hydralazine  10 mg 3 times daily as needed - Basic Metabolic Panel (BMET) With sudden onset of elevated blood pressures and left flank pain, feel we need to rule out secondary causes of hypertension.  She has had low potassium, which is why we discontinued HCTZ and started spironolactone .  She has had chronically low sodium that has been worse more recently that had been contributed to the diuretic and her mental health medications, it had improved with salt tabs, but has since been more difficult to return to baseline. -We discussed secondary causes of hypertension today and elected to move forward with renal artery ultrasound and renal artery stenosis ultrasound along with aldosterone, renin, cortisol and repeat BMP. Referral to cardiology also placed for further evaluation.   Hyponatremia Endocrine referral placed 04/19/2024 Continue salt tabs -  Basic Metabolic Panel (BMET) Avoid consuming water alone- should always have electrolyte replacements    Reviewed expectations re: course of current medical issues. Discussed self-management of symptoms. Outlined signs and symptoms indicating need for more acute intervention. Patient verbalized understanding and all questions were answered. Patient received an After-Visit Summary. Any changes in medications were reviewed and patient was provided with updated med list with their AVS.     Orders Placed This Encounter  Procedures   US  Renal Artery Stenosis   US  Renal   Aldosterone + renin activity w/ ratio   Basic Metabolic Panel (BMET)   Cortisol   Ambulatory referral to Cardiology     Note is dictated utilizing voice recognition software. Although note has been proof read prior to signing, occasional typographical errors still can be missed. If any questions arise, please do not hesitate to call for verification.   electronically signed by:  Charlies Bellini, DO  [facility] - [place]         [1]  Allergies Allergen Reactions   Augmentin  [Amoxicillin -Pot Clavulanate] Other (See Comments)    Diarrhea C.diff positive.   "

## 2024-05-03 NOTE — Patient Instructions (Signed)

## 2024-05-08 LAB — ALDOSTERONE + RENIN ACTIVITY W/ RATIO
ALDO / PRA Ratio: 7.1 ratio (ref 0.9–28.9)
Aldosterone: 5 ng/dL
Renin Activity: 0.7 ng/mL/h (ref 0.25–5.82)

## 2024-05-09 ENCOUNTER — Ambulatory Visit (HOSPITAL_BASED_OUTPATIENT_CLINIC_OR_DEPARTMENT_OTHER)
Admission: RE | Admit: 2024-05-09 | Discharge: 2024-05-09 | Disposition: A | Discharge status: Home / Self Care | Source: Ambulatory Visit | Attending: Family Medicine | Admitting: Family Medicine

## 2024-05-09 DIAGNOSIS — E663 Overweight: Secondary | ICD-10-CM | POA: Insufficient documentation

## 2024-05-09 DIAGNOSIS — I159 Secondary hypertension, unspecified: Secondary | ICD-10-CM | POA: Diagnosis present

## 2024-05-09 DIAGNOSIS — I10 Essential (primary) hypertension: Secondary | ICD-10-CM | POA: Insufficient documentation

## 2024-05-09 DIAGNOSIS — E871 Hypo-osmolality and hyponatremia: Secondary | ICD-10-CM | POA: Diagnosis present

## 2024-05-09 NOTE — Progress Notes (Signed)
 "     Cardiology Office Note Date:  05/12/2024  ID:  CAIRO LINGENFELTER, DOB 1970-12-09, MRN [id number] PCP:  Catherine Charlies LABOR, DO  Cardiologist:  Joelle VEAR Ren Donley, MD  Chief Complaint  Patient presents with   Hypertension     Problems CAC 1/25: 0 HTN: Goal SBP < 140 Renin aldo 1/26: Negative M: HE10TID as needed, LN100, SE25  Visits  1/26: LDL 7/24 124; LP/Lipoprotein A, AE5, hydral for SBP > 160     Discussed the use of AI scribe software for clinical note transcription with the patient, who gave verbal consent to proceed.  History of Present Illness Dawn Scott is a 54 year old female with hypertension who presents for evaluation of blood pressure fluctuations and low sodium levels. She was referred by her primary care physician for further evaluation of blood pressure and sodium level issues. In December, she was hospitalized for two days due to low sodium levels (122 mmol/L) and elevated blood pressure (160 mmHg). She experienced significant side pain, rated 8-9 out of 10, which prompted the hospital visit. Her sodium levels had been low since June or July, suspected to be related to psychiatric medications. She discontinued Wellbutrin  under the guidance of her nurse practitioner, and her sodium levels normalized after four weeks. She has a history of hypertension, diagnosed approximately three years ago, with blood pressure gradually increasing over the past 10-15 years. She initially started on a low dose of hydrochlorothiazide  (HCTZ), which was later adjusted, and a sodium tablet was added to her regimen. She notes that her sodium levels dropped with HCTZ use. Her blood pressure readings have been variable, with recent averages in the high 130s to low 140s mmHg. She has used hydralazine  as needed in the past when her blood pressure exceeded 140/100 mmHg, typically waiting 15-30 minutes before taking it. She has reduced caffeine intake and now drinks tea, ensuring her beverages contain  electrolytes. Family history is significant for her mother having a stroke, mini-strokes, and hypertension since age 3, and her maternal grandmother died of a stroke at age 49. No history of heart problems, chest pain, or shortness of breath. She does not smoke cigarettes.   ROS: Please see the history of present illness. All other systems are reviewed and negative.   Past Medical History:  Diagnosis Date   Allergy    Anxiety    Bloody stool 07/07/2016   C. difficile colitis 08/03/2018   Clostridioides difficile infection    Depression    Endometriosis 06/28/2015   Fall 10/16/2023   [place]- [place] and hit her head, wrist and knee. Was seen at ED and had CT> normal.   GAD (generalized anxiety disorder) 04/01/2018   Hemangioma of skin and subcutaneous tissue 08/21/2021   History of ectopic pregnancy    04/ 2002  S/P  LEFT SALPINGECTOMY   History of endometriosis    History of neoplasm 08/21/2021   History of ovarian cyst    Hypertension    Left ovarian cyst    LGSIL on Pap smear of cervix 08/20/2020   Menorrhagia with regular cycle    PONV (postoperative nausea and vomiting)    Shingles    Uterine fibroid     Past Surgical History:  Procedure Laterality Date   APPENDECTOMY     DILATATION & CURETTAGE/HYSTEROSCOPY WITH MYOSURE N/A 10/30/2016   Procedure: , REMOVAL IUD, HYSTEROSCOPY, DILATION AND CURETTAGE;  Surgeon: Lavoie, Marie-Lyne, MD;  Location: [facility];  Service: Gynecology;  Laterality: N/A;   DILATION AND EVACUATION  03/10/2003   dr lavoie   retained placental tissue postpartum   DX LAPAROSCOPY/ LYSIS ADHESIONAS/  FULGERATION ENDOMETRIOSIS/  LEFT SALPINGECTOMY WITH REMOVAL ECOTPIC  08/18/2000   dr gorge   HYSTEROSCOPY WITH NOVASURE N/A 10/30/2016   Procedure: HYSTEROSCOPY WITH NOVASURE;  Surgeon: Lavoie, Marie-Lyne, MD;  Location: [facility];  Service: Gynecology;  Laterality: N/A;   LAPAROSCOPIC APPENDECTOMY  11/30/2007    SEPTOPLASTY  2011   WISDOM TOOTH EXTRACTION  1996    Current Outpatient Medications  Medication Sig Dispense Refill   Biotin 1 MG CAPS Take by mouth.     Cholecalciferol (VITAMIN D3) 50 MCG (2000 UT) TABS Take 4,000 Units by mouth every morning. 60 tablet 11   Cyanocobalamin  (B-12 PO) Take 1 tablet by mouth every morning.      cyclobenzaprine  (FLEXERIL ) 10 MG tablet Take 1 tablet (10 mg total) by mouth at bedtime. 30 tablet 0   DULoxetine  (CYMBALTA ) 30 MG capsule Take 1 capsule (30 mg total) by mouth daily. 90 capsule 3   DULoxetine  (CYMBALTA ) 60 MG capsule Take 60 mg by mouth daily.     estradiol  (VIVELLE -DOT) 0.05 MG/24HR patch PLACE 1 PATCH (0.05 MG TOTAL) ONTO THE SKIN 2 (TWO) TIMES A WEEK. 8 patch 7   folic acid  (FOLVITE ) 1 MG tablet Take 1 mg by mouth daily.     hydrALAZINE  (APRESOLINE ) 10 MG tablet Take 1 tablet (10 mg total) by mouth 3 (three) times daily. 90 tablet 0   lamoTRIgine  (LAMICTAL ) 150 MG tablet Take 1 tablet (150 mg total) by mouth every evening. 90 tablet 3   losartan  (COZAAR ) 100 MG tablet Take 1 tablet (100 mg total) by mouth daily. 90 tablet 1   Multiple Vitamins-Minerals (MULTIVITAMIN WITH MINERALS) tablet Take 1 tablet by mouth every evening.      ondansetron  (ZOFRAN ) 4 MG tablet Take 1 tablet (4 mg total) by mouth every 6 (six) hours as needed for nausea. 20 tablet 0   Probiotic Product (ALIGN PO) Take by mouth.     progesterone  (PROMETRIUM ) 100 MG capsule TAKE 1 CAPSULE BY MOUTH AT BEDTIME. 30 capsule 5   sodium chloride  1 g tablet Take 1 tablet (1 g total) by mouth 2 (two) times daily with a meal. 180 tablet 1   spironolactone  (ALDACTONE ) 25 MG tablet Take 1 tablet (25 mg total) by mouth daily. 90 tablet 1   urea  (GORDONS UREA ) 40 % CREA Apply 1 Application topically 2 (two) times daily. 198 g 2   Current Facility-Administered Medications  Medication Dose Route Frequency Provider Last Rate Last Admin   incobotulinumtoxinA  (XEOMIN ) 100 units injection 300 Units   300 Units Intramuscular Q90 days Tat, Asberry RAMAN, DO   270 Units at 04/08/24 9075    Allergies:   Augmentin  [amoxicillin -pot clavulanate]   Social History:  see above  Family History:  see above  PHYSICAL EXAM: VS:  BP (!) 162/80   Pulse 81   Ht 5' 9 (1.753 m)   Wt 186 lb (84.4 kg)   LMP 06/05/2022 Comment: Very light  SpO2 97%   BMI 27.47 kg/m  , BMI Body mass index is 27.47 kg/m. GEN: Well nourished, well developed, in no acute distress HEENT: normal Neck: no JVD, carotid bruits, or masses Cardiac: RRR; no murmurs, rubs, or gallops,no edema  Respiratory:  CTAB bilaterally, normal work of breathing GI: soft, nontender, nondistended, + BS Extremities: No LE edema Skin: warm  and dry, no rash Neuro:  Strength and sensation are intact  EKG: NSR  Recent Labs: Reviewed  Studies: Reviewed Assessment & Plan  Resistant hypertension Blood pressure suboptimally managed with current regimen. Recent hypotension and hyponatremia possibly linked to psychiatric medications. No significant cardiac history but family history of stroke and hypertension. BP log with average SBP high 130s-low 140s. Given her overall low ASCVD risk with CAC 0, goal SBP should be mid 130s. - Started amlodipine  5 mg daily. - Instructed to check blood pressure twice daily. - Use hydralazine  only if blood pressure exceeds 160 mmHg. - Ordered blood work for cholesterol and lipoprotein A. - Consider nephrology referral if sodium issues persist.   Signed, Joelle VEAR Ren Donley, MD  05/12/2024 3:53 PM    [facility] "

## 2024-05-10 ENCOUNTER — Ambulatory Visit (HOSPITAL_COMMUNITY)
Admission: RE | Admit: 2024-05-10 | Discharge: 2024-05-10 | Disposition: A | Discharge status: Home / Self Care | Source: Ambulatory Visit | Attending: Family Medicine | Admitting: Family Medicine

## 2024-05-10 ENCOUNTER — Other Ambulatory Visit: Payer: Self-pay | Admitting: Family Medicine

## 2024-05-10 DIAGNOSIS — I10 Essential (primary) hypertension: Secondary | ICD-10-CM | POA: Insufficient documentation

## 2024-05-10 DIAGNOSIS — I159 Secondary hypertension, unspecified: Secondary | ICD-10-CM | POA: Diagnosis not present

## 2024-05-10 DIAGNOSIS — E871 Hypo-osmolality and hyponatremia: Secondary | ICD-10-CM | POA: Diagnosis not present

## 2024-05-10 DIAGNOSIS — E663 Overweight: Secondary | ICD-10-CM | POA: Insufficient documentation

## 2024-05-10 NOTE — Telephone Encounter (Signed)
 Please call patient Her renal ultrasound is normal and her renal artery ultrasound does not show evidence of renal artery stenosis.  Both are reassuring, and her labs are reassuring as well with normal renin and aldosterone levels.  If she has not yet heard from cardiology, they should be calling her to schedule the referral was placed at her office visit appointment passed.

## 2024-05-12 ENCOUNTER — Ambulatory Visit: Admitting: Physician Assistant

## 2024-05-12 ENCOUNTER — Encounter: Payer: Self-pay | Admitting: Physician Assistant

## 2024-05-12 ENCOUNTER — Ambulatory Visit

## 2024-05-12 VITALS — BP 162/80 | HR 81 | Ht 69.0 in | Wt 186.0 lb

## 2024-05-12 DIAGNOSIS — I10 Essential (primary) hypertension: Secondary | ICD-10-CM

## 2024-05-12 DIAGNOSIS — F411 Generalized anxiety disorder: Secondary | ICD-10-CM | POA: Diagnosis not present

## 2024-05-12 DIAGNOSIS — F3342 Major depressive disorder, recurrent, in full remission: Secondary | ICD-10-CM | POA: Diagnosis not present

## 2024-05-12 DIAGNOSIS — E871 Hypo-osmolality and hyponatremia: Secondary | ICD-10-CM

## 2024-05-12 DIAGNOSIS — G4700 Insomnia, unspecified: Secondary | ICD-10-CM | POA: Diagnosis not present

## 2024-05-12 DIAGNOSIS — I1A0 Resistant hypertension: Secondary | ICD-10-CM

## 2024-05-12 MED ORDER — HYDRALAZINE HCL 10 MG PO TABS: 10.0000 mg | ORAL_TABLET | Freq: Every day | ORAL | 1 refills | Status: AC | PRN

## 2024-05-12 MED ORDER — LAMOTRIGINE 150 MG PO TABS: 150.0000 mg | ORAL_TABLET | Freq: Every evening | ORAL | 3 refills | Status: AC

## 2024-05-12 MED ORDER — AMLODIPINE BESYLATE 5 MG PO TABS: 5.0000 mg | ORAL_TABLET | Freq: Every day | ORAL | 3 refills | Status: AC

## 2024-05-12 MED ORDER — DULOXETINE HCL 30 MG PO CPEP: 30.0000 mg | ORAL_CAPSULE | Freq: Every day | ORAL | 3 refills | Status: AC

## 2024-05-12 NOTE — Patient Instructions (Signed)
 Medication Instructions:  Start Amlodipine  5 mg take one tablet daily  *If you need a refill on your cardiac medications before your next appointment, please call your pharmacy*  Lab Work: Today- Lpa, Lipids If you have labs (blood work) drawn today and your tests are completely normal, you will receive your results only by: MyChart Message (if you have MyChart) OR A paper copy in the mail If you have any lab test that is abnormal or we need to change your treatment, we will call you to review the results.   Follow-Up: As needed

## 2024-05-12 NOTE — Progress Notes (Signed)
 "     [facility]  Patient ID: Dawn Scott,  MRN: [id number]  PCP: Catherine Charlies LABOR, DO  Date of Evaluation: 05/12/2024 Time spent:35 minutes  Chief Complaint:  Chief Complaint   Depression    HISTORY/CURRENT STATUS: HPI For routine  med check.    Was taken off cymbalta  but she went back on it after the recent hospitalization.  See ROS and notes on chart. She feels much better on it and Na level has stayed low nl (her baseline) since then. We had stopped the Wellbutrin  2 months ago, hoping to bring the sodium back to nl range but it obviously didn't.  She had no neg effects after stopping that.   Her mood is good.  She is able to enjoy things.  Energy and motivation are good.  Work is going well.   No extreme sadness, tearfulness, or feelings of hopelessness.  Sleeps well.  ADLs and personal hygiene are normal.   Denies any changes in concentration, making decisions, or remembering things.  Appetite has not changed.  Weight is stable.   No anxiety reported. It was pretty bad after stopping the cymbalta  but better after restarting it.  No mania, delirium, AH/VH.  No SI/HI.  Individual Medical History/ Review of Systems: Changes? :Yes   was hospitalized for severe hyponatremia and severe pain in left side, also HTN, 04/11/2024   Past medications for mental health diagnoses include: Klonopin , Prozac , Prosom, Ambien , Cymbalta , Wellbutrin  possible decreased sodium, Lamictal    Allergies: Augmentin  [amoxicillin -pot clavulanate]  Current Medications:  Current Outpatient Medications:    Biotin 1 MG CAPS, Take by mouth., Disp: , Rfl:    Cholecalciferol (VITAMIN D3) 50 MCG (2000 UT) TABS, Take 4,000 Units by mouth every morning., Disp: 60 tablet, Rfl: 11   Cyanocobalamin  (B-12 PO), Take 1 tablet by mouth every morning. , Disp: , Rfl:    cyclobenzaprine  (FLEXERIL ) 10 MG tablet, Take 1 tablet (10 mg total) by mouth at bedtime., Disp: 30 tablet, Rfl: 0   DULoxetine  (CYMBALTA ) 60 MG  capsule, Take 60 mg by mouth daily., Disp: , Rfl:    estradiol  (VIVELLE -DOT) 0.05 MG/24HR patch, PLACE 1 PATCH (0.05 MG TOTAL) ONTO THE SKIN 2 (TWO) TIMES A WEEK., Disp: 8 patch, Rfl: 7   folic acid  (FOLVITE ) 1 MG tablet, Take 1 mg by mouth daily., Disp: , Rfl:    losartan  (COZAAR ) 100 MG tablet, Take 1 tablet (100 mg total) by mouth daily., Disp: 90 tablet, Rfl: 1   Multiple Vitamins-Minerals (MULTIVITAMIN WITH MINERALS) tablet, Take 1 tablet by mouth every evening. , Disp: , Rfl:    ondansetron  (ZOFRAN ) 4 MG tablet, Take 1 tablet (4 mg total) by mouth every 6 (six) hours as needed for nausea., Disp: 20 tablet, Rfl: 0   Probiotic Product (ALIGN PO), Take by mouth., Disp: , Rfl:    progesterone  (PROMETRIUM ) 100 MG capsule, TAKE 1 CAPSULE BY MOUTH AT BEDTIME., Disp: 30 capsule, Rfl: 5   sodium chloride  1 g tablet, Take 1 tablet (1 g total) by mouth 2 (two) times daily with a meal., Disp: 180 tablet, Rfl: 1   spironolactone  (ALDACTONE ) 25 MG tablet, Take 1 tablet (25 mg total) by mouth daily., Disp: 90 tablet, Rfl: 1   urea  (GORDONS UREA ) 40 % CREA, Apply 1 Application topically 2 (two) times daily., Disp: 198 g, Rfl: 2   amLODipine  (NORVASC ) 5 MG tablet, Take 1 tablet (5 mg total) by mouth daily., Disp: 90 tablet, Rfl: 3  DULoxetine  (CYMBALTA ) 30 MG capsule, Take 1 capsule (30 mg total) by mouth daily., Disp: 90 capsule, Rfl: 3   hydrALAZINE  (APRESOLINE ) 10 MG tablet, Take 1 tablet (10 mg total) by mouth daily as needed. Take one tablet daily as needed for systolic (top number) higher than 160, Disp: 90 tablet, Rfl: 1   lamoTRIgine  (LAMICTAL ) 150 MG tablet, Take 1 tablet (150 mg total) by mouth every evening., Disp: 90 tablet, Rfl: 3  Current Facility-Administered Medications:    incobotulinumtoxinA  (XEOMIN ) 100 units injection 300 Units, 300 Units, Intramuscular, Q90 days, Tat, Asberry RAMAN, DO, 270 Units at 04/08/24 9075 Medication Side Effects: none  Family Medical/ Social History: Changes?   no  MENTAL HEALTH EXAM:  Last menstrual period 06/05/2022.There is no height or weight on file to calculate BMI.  General Appearance: Casual, Neat and Well Groomed  Eye Contact:  Good  Speech:  Clear and Coherent and Normal Rate  Volume:  Normal  Mood:  Euthymic  Affect:  Congruent  Thought Process:  Goal Directed and Descriptions of Associations: Circumstantial  Orientation:  Full (Time, Place, and Person)  Thought Content: Logical   Suicidal Thoughts:  No  Homicidal Thoughts:  No  Memory:  WNL  Judgement:  Good  Insight:  Good  Psychomotor Activity:  Normal  Concentration:  Concentration: Good and Attention Span: Good  Recall:  Good  Fund of Knowledge: Good  Language: Good  Assets:  Communication Skills Desire for Improvement Financial Resources/Insurance Housing Resilience Social Support Transportation Vocational/Educational  ADL's:  Intact  Cognition: WNL  Prognosis:  Good   Reviewed discharge summary from 12/15 hospilization  DIAGNOSES:    ICD-10-CM   1. Recurrent major depressive disorder, in full remission  F33.42     2. Generalized anxiety disorder  F41.1     3. Insomnia, unspecified type  G47.00     4. Hyponatremia  E87.1       Receiving Psychotherapy: No    RECOMMENDATIONS:  PDMP was reviewed.  No recent controlled substances. I provided approximately  35  minutes of face to face time during this encounter, including time spent before and after the visit in records review, medical decision making, counseling pertinent to today's visit, and charting.   Discussed the low Na and possible cause from [place].  She understands and agrees it's best to stay on it b/c helps w/ anx, and is Na is back to her stable low 130s, and it's been that for years. However, we may need to decrease the dose of Cymbalta  to see if that helps, if Na drops in the future. She's seeing cardiology this afternoon for HTN and possible relation to low sodium.  Might need to see  nephrology as well.  Continue Cymbalta  30 mg +60 mg daily. Continue Lamictal  150 mg daily. Continue multivitamin, vitamin D , B12, and biotin. Return in 3 months.   Verneita Cooks, PA-C  "

## 2024-05-14 ENCOUNTER — Other Ambulatory Visit: Payer: Self-pay | Admitting: Radiology

## 2024-05-14 DIAGNOSIS — Z79890 Hormone replacement therapy (postmenopausal): Secondary | ICD-10-CM

## 2024-05-14 LAB — LIPOPROTEIN A (LPA): Lipoprotein (a): <8.4 nmol/L

## 2024-05-14 LAB — LIPID PANEL
Chol/HDL Ratio: 3.4 ratio (ref 0.0–4.4)
Cholesterol, Total: 224 mg/dL — ABNORMAL HIGH (ref 100–199)
HDL: 66 mg/dL
LDL Chol Calc (NIH): 124 mg/dL — ABNORMAL HIGH (ref 0–99)
Triglycerides: 194 mg/dL — ABNORMAL HIGH (ref 0–149)
VLDL Cholesterol Cal: 34 mg/dL (ref 5–40)

## 2024-05-16 ENCOUNTER — Ambulatory Visit: Payer: Self-pay

## 2024-05-16 NOTE — Telephone Encounter (Signed)
 Med refill request: Progesterone  (Prometrium ) 100 MG capsule Last AEX: 06/16/23 BS Next AEX: 06/30/24 BS Last MMG (if hormonal med) 09/17/23 Refill authorized: Please Advise? Last Rx sent #[phone] with 5 refills on 11/17/23 JC

## 2024-06-09 ENCOUNTER — Encounter: Admitting: Family Medicine

## 2024-06-22 ENCOUNTER — Ambulatory Visit: Payer: 59 | Admitting: Obstetrics and Gynecology

## 2024-06-30 ENCOUNTER — Ambulatory Visit: Admitting: Obstetrics and Gynecology

## 2024-07-08 ENCOUNTER — Ambulatory Visit: Admitting: Neurology

## 2024-08-11 ENCOUNTER — Ambulatory Visit: Admitting: Physician Assistant
